# Patient Record
Sex: Male | Born: 1965
Health system: Southern US, Community
[De-identification: ages and names within clinical notes are randomized; demographics above are authoritative.]

## PROBLEM LIST (undated history)

## (undated) DIAGNOSIS — G4733 Obstructive sleep apnea (adult) (pediatric): Secondary | ICD-10-CM

## (undated) DIAGNOSIS — I519 Heart disease, unspecified: Secondary | ICD-10-CM

## (undated) DIAGNOSIS — I428 Other cardiomyopathies: Secondary | ICD-10-CM

## (undated) DIAGNOSIS — I517 Cardiomegaly: Secondary | ICD-10-CM

## (undated) DIAGNOSIS — I10 Essential (primary) hypertension: Secondary | ICD-10-CM

## (undated) DIAGNOSIS — E669 Obesity, unspecified: Secondary | ICD-10-CM

## (undated) DIAGNOSIS — E785 Hyperlipidemia, unspecified: Secondary | ICD-10-CM

## (undated) DIAGNOSIS — I4891 Unspecified atrial fibrillation: Secondary | ICD-10-CM

## (undated) DIAGNOSIS — L570 Actinic keratosis: Secondary | ICD-10-CM

## (undated) DIAGNOSIS — I739 Peripheral vascular disease, unspecified: Secondary | ICD-10-CM

## (undated) DIAGNOSIS — C4491 Basal cell carcinoma of skin, unspecified: Secondary | ICD-10-CM

## (undated) HISTORY — DX: Unspecified atrial fibrillation: I48.91

## (undated) HISTORY — DX: Heart disease, unspecified: I51.9

## (undated) HISTORY — DX: Other cardiomyopathies: I42.8

## (undated) HISTORY — DX: Hyperlipidemia, unspecified: E78.5

## (undated) HISTORY — DX: Peripheral vascular disease, unspecified: I73.9

## (undated) HISTORY — DX: Obstructive sleep apnea (adult) (pediatric): G47.33

## (undated) HISTORY — DX: Actinic keratosis: L57.0

## (undated) HISTORY — DX: Essential (primary) hypertension: I10

## (undated) HISTORY — DX: Obesity, unspecified: E66.9

## (undated) HISTORY — DX: Cardiomegaly: I51.7

## (undated) HISTORY — DX: Basal cell carcinoma of skin, unspecified: C44.91

---

## 2004-10-11 ENCOUNTER — Ambulatory Visit: Payer: Self-pay | Admitting: Cardiology

## 2004-10-15 ENCOUNTER — Inpatient Hospital Stay (HOSPITAL_BASED_OUTPATIENT_CLINIC_OR_DEPARTMENT_OTHER): Admission: RE | Admit: 2004-10-15 | Discharge: 2004-10-15 | Payer: Self-pay | Admitting: Cardiovascular Disease

## 2004-10-15 ENCOUNTER — Ambulatory Visit: Payer: Self-pay | Admitting: Cardiovascular Disease

## 2004-10-15 HISTORY — PX: CARDIAC CATHETERIZATION: SHX172

## 2004-10-16 ENCOUNTER — Ambulatory Visit: Payer: Self-pay | Admitting: Cardiology

## 2011-07-18 ENCOUNTER — Emergency Department (HOSPITAL_COMMUNITY): Payer: BC Managed Care – PPO

## 2011-07-18 ENCOUNTER — Inpatient Hospital Stay (HOSPITAL_COMMUNITY)
Admission: EM | Admit: 2011-07-18 | Discharge: 2011-07-19 | DRG: 139 | Disposition: A | Discharge status: Home / Self Care | Payer: BC Managed Care – PPO | Attending: Cardiovascular Disease | Admitting: Cardiovascular Disease

## 2011-07-18 DIAGNOSIS — I4892 Unspecified atrial flutter: Secondary | ICD-10-CM | POA: Diagnosis present

## 2011-07-18 DIAGNOSIS — G4733 Obstructive sleep apnea (adult) (pediatric): Secondary | ICD-10-CM | POA: Diagnosis present

## 2011-07-18 DIAGNOSIS — E119 Type 2 diabetes mellitus without complications: Secondary | ICD-10-CM | POA: Diagnosis present

## 2011-07-18 DIAGNOSIS — Z7982 Long term (current) use of aspirin: Secondary | ICD-10-CM

## 2011-07-18 DIAGNOSIS — I4891 Unspecified atrial fibrillation: Principal | ICD-10-CM | POA: Diagnosis present

## 2011-07-18 DIAGNOSIS — Z91199 Patient's noncompliance with other medical treatment and regimen due to unspecified reason: Secondary | ICD-10-CM

## 2011-07-18 DIAGNOSIS — Z8249 Family history of ischemic heart disease and other diseases of the circulatory system: Secondary | ICD-10-CM

## 2011-07-18 DIAGNOSIS — E669 Obesity, unspecified: Secondary | ICD-10-CM | POA: Diagnosis present

## 2011-07-18 DIAGNOSIS — Z9119 Patient's noncompliance with other medical treatment and regimen: Secondary | ICD-10-CM

## 2011-07-18 DIAGNOSIS — I10 Essential (primary) hypertension: Secondary | ICD-10-CM | POA: Diagnosis present

## 2011-07-18 LAB — CBC
MCHC: 35.5 g/dL (ref 30.0–36.0)
MCV: 85.9 fL (ref 78.0–100.0)
Platelets: 198 10*3/uL (ref 150–400)
Platelets: 199 10*3/uL (ref 150–400)
RBC: 4.76 MIL/uL (ref 4.22–5.81)
RDW: 13.2 % (ref 11.5–15.5)
RDW: 13 % (ref 11.5–15.5)
WBC: 7.3 10*3/uL (ref 4.0–10.5)
WBC: 7.9 10*3/uL (ref 4.0–10.5)

## 2011-07-18 LAB — COMPREHENSIVE METABOLIC PANEL
Albumin: 4.3 g/dL (ref 3.5–5.2)
BUN: 15 mg/dL (ref 6–23)
Chloride: 102 mEq/L (ref 96–112)
Creatinine, Ser: 0.72 mg/dL (ref 0.50–1.35)
GFR calc Af Amer: >60 mL/min (ref >60)
GFR calc non Af Amer: >60 mL/min (ref >60)
Glucose, Bld: 114 mg/dL — ABNORMAL HIGH (ref 70–99)
Total Bilirubin: 0.8 mg/dL (ref 0.3–1.2)

## 2011-07-18 LAB — PROTIME-INR
INR: 1.1 (ref 0.00–1.49)
Prothrombin Time: 14.4 seconds (ref 11.6–15.2)

## 2011-07-18 LAB — CK TOTAL AND CKMB (NOT AT ARMC): Relative Index: 2.3 (ref 0.0–2.5)

## 2011-07-18 LAB — DIFFERENTIAL
Basophils Absolute: 0.1 10*3/uL (ref 0.0–0.1)
Eosinophils Absolute: 0.1 10*3/uL (ref 0.0–0.7)
Eosinophils Relative: 2 % (ref 0–5)
Lymphs Abs: 2.8 10*3/uL (ref 0.7–4.0)

## 2011-07-18 LAB — POCT I-STAT TROPONIN I: Troponin i, poc: 0 ng/mL (ref 0.00–0.08)

## 2011-07-18 LAB — APTT: aPTT: 31 seconds (ref 24–37)

## 2011-07-18 LAB — GLUCOSE, CAPILLARY: Glucose-Capillary: 155 mg/dL — ABNORMAL HIGH (ref 70–99)

## 2011-07-19 LAB — CARDIAC PANEL(CRET KIN+CKTOT+MB+TROPI)
CK, MB: 3 ng/mL (ref 0.3–4.0)
Relative Index: 2.7 — ABNORMAL HIGH (ref 0.0–2.5)
Relative Index: INVALID (ref 0.0–2.5)
Troponin I: <0.3 ng/mL (ref <0.30)

## 2011-07-19 LAB — HEMOGLOBIN A1C: Hgb A1c MFr Bld: 8.3 % — ABNORMAL HIGH (ref <5.7)

## 2011-07-19 LAB — PROTIME-INR: INR: 1.13 (ref 0.00–1.49)

## 2011-07-19 LAB — GLUCOSE, CAPILLARY: Glucose-Capillary: 160 mg/dL — ABNORMAL HIGH (ref 70–99)

## 2011-07-19 LAB — HEPARIN LEVEL (UNFRACTIONATED)
Heparin Unfractionated: 0.1 IU/mL — ABNORMAL LOW (ref 0.30–0.70)
Heparin Unfractionated: 0.29 IU/mL — ABNORMAL LOW (ref 0.30–0.70)

## 2011-07-20 NOTE — H&P (Signed)
NAME:  Charles [name], Charles [name] NO.:  [id number]  MEDICAL RECORD NO.:  [id number]  LOCATION:  2922                         FACILITY:  [facility]  PHYSICIAN:  Thurmon Fair, MD     DATE OF BIRTH:  06/20/1966  DATE OF ADMISSION:  07/18/2011 DATE OF DISCHARGE:                             HISTORY & PHYSICAL   Charles [name] is a 45 year old gentleman who presents with atrial fibrillation and rapid ventricular response.  Charles [name] has had irregular palpitations for years.  These are often associated with night sweats.  He does have a history of documented paroxysmal atrial fibrillation that was associated with a coronary angiogram performed many years ago.  No other documented episodes of atrial fibrillation or atrial flutter have occurred to his knowledge since that time.  In 2005, he underwent coronary arteriography due to a what ended up being a false positive nuclear stress test suggesting inferior wall ischemia.Over the last week, he has had worsening dyspnea on exertion that over the last couple of days has approached New York Heart Association functional class 3 level.  He has also experienced chest pressure, again worsened with exertion.  He has felt that he has a lot less energy than he has in the past.  Currently, he has received intravenous diltiazem for rate control and his rhythm has promptly organized to atrial flutter with variable AV block and an average ventricular rate of about 80 beats per minute.  He is currently asymptomatic.  He does not have any history of stroke or transient ischemic attack or other embolic events.  PAST MEDICAL HISTORY:  Significant for poorly-controlled diabetes mellitus (most recent hemoglobin A1c 8% which is an improvement from the previous level of 11%); insufficiently treated systemic hypertension, recently was added beta-blocker therapy; formally diagnosed obstructive sleep apnea by sleep study, but noncompliant with CPAP therapy  for many years.  He is also obese.  He does not have a history of hypercholesterolemia and in fact tells me that his lipid level were excellent when they were checked by his primary care physician.  He has no history of peripheral vascular disease and at the time of his cardiac catheterization, a nuclear stress test many years ago reportedly had normal left ventricular systolic function with an estimated ejection fraction of about 60%.  He does not have history of valvular problems and as mentioned, no history of stroke or TIA.  FAMILY HISTORY:  Significant for premature coronary artery disease in his older sister who has had multiple stent procedures.  There is no family history of arrhythmias or sudden cardiac death.  He has no known drug allergies but did develop nausea when Novocaine was administered for dental procedure.  CURRENT MEDICATIONS: 1. Glipizide 5 mg twice daily. 2. Glucophage 500 mg once daily. 3. Carvedilol 3.125 mg b.i.d. which was recently started. 4. Lisinopril 20 mg once daily.  SOCIAL HISTORY:  He has never smoked.  He drinks alcohol no more than once or twice a year.  He has never used recreational drugs.  He is in a stable monogamous homosexual relationship for the last 10 months.  PHYSICAL EXAMINATION:  GENERAL:  He is a tall, but also  obese individual. VITAL SIGNS:  He is 6 feet 2 inches tall and weighs roughly 280 pounds. His blood pressure is 170/93, his heart rate is 80 beats per minute and irregular, respiratory rate is 12, he is afebrile, and oxygen saturation is 97% on room air. HEENT:  Head exam is unremarkable except for very crowded oropharynx. His mouth, ears, nose, and teeth are otherwise within normal limits.  He does not have any evidence of ear creases, xanthelasma, exophthalmos, lid lag, or myxedema. NECK:  Short and broad, but very supple.  He does not have any evidence of jugular venous distention or hepatojugular reflux, but I am  not sure that we can clearly identify his jugular veins.  He does not have carotid delay or bruits.  There is no goiter or lymphadenopathy. LUNGS:  Clear to auscultation bilaterally with symmetrical respiratory excursions and no evidence of abnormal fremitus, dullness to percussion, egophony, or signs of consolidation. CARDIOVASCULAR:  An apical impulse that is very difficult to locate. The rhythm is irregular.  Normal first and second heart sounds without any murmurs, rubs, or gallops. ABDOMEN:  Obese and examination is limited, but there is no obvious tenderness, distention, organomegaly, mass, or bruit.  The bowel sounds are normal. EXTREMITIES:  No clubbing or cyanosis or edema.  Distal pulses are 3+ and bounding in the dorsalis pedis, posterior tibials, radials, and ulnars bilaterally.  I cannot locate his popliteals and it is hard to palpate his femoral arteries.  I do not hear any femoral or subclavian bruits. NEUROLOGIC:  Does not show any gross focal deficits.  The deep tendon reflexes appear to be normal kinetic.  There are no overt clinical findings to suggest thyroid illness.  His electrocardiogram shows atrial fibrillation with rapid ventricular response, but currently on the monitor, the rhythm is clearly atrial flutter.  His chest x-ray is a portable film and describes cardiomegaly, but there is no evidence of congestive heart failure or other active abnormalities.  I think the heart may appear large simply because of the AP film.  Laboratory tests are significant for normal CK, CK-MB, and troponin levels.  Normal CBC with a hemoglobin of 14.1, white blood cell count of 7.3, and a platelet count of 198,000.  The comprehensive metabolic panel is entirely normal except for a glucose level of 114 that is borderline elevated.  The creatinine is 0.72.  All liver function tests are normal as are the electrolytes.  TSH level is not yet available.  ASSESSMENT AND PLAN:   Charles [name] is a 45 year old gentleman who presents with atrial fibrillation with rapid ventricular response, subsequently organized to atrial flutter with controlled ventricular rate who also has hypertension, diabetes mellitus, and untreated obstructive sleep apnea.  His CHADS2 score is 2 which would lead to an indication for chronic warfarin anticoagulation.  He will be started on intravenous heparin and subsequently warfarin.  Since the arrhythmia is of uncertain duration and likely has lasted for more than 24 hours, an immediate cardioversion is not recommended.  If we need to perform cardioversion, that should be preceded by a transesophageal echocardiogram.  Because of this, we will hold his breakfast tomorrow morning just in case we have to organize a TEE cardioversion.  On the other hand, the fact that his rhythm is becoming more organized suggest that he may convert to sinus rhythm over the next several hours.  In the meantime, we will try to convert his rate control medicines to oral medications.  I think the  carvedilol that has recently been started will be an optimal choice to provide both better blood pressure control as well as rate control.  Together with lisinopril, this would be a very sensible system of antihypertensives and rate control medication for a patient with diabetes mellitus.  In the long term, treatment of obstructive sleep apnea with CPAP is critically important.  I suspect that obstructive sleep apnea, possibly complicated by pulmonary hypertension, cor pulmonale may well under lie his arrhythmia.  In the long term, also aggressive attempts at weight loss would be beneficial for treatment of obstructive sleep apnea and diabetes mellitus and indirectly may benefit his arrhythmia.  At this point in time, antiarrhythmic medications are not indicated especially since he is not yet anticoagulated.  I also do not think that he would be necessarily a good  candidate for long-term antiarrhythmic therapy since when he has good rate control he appears to be asymptomatic.  The long-term risk of embolic stroke was discussed in detail with Charles [name].  We also briefly discussed warfarin.  We will take the opportunity tomorrow to talk about alternatives to warfarin such as Pradaxa and Xarelto and if he chooses to go with warfarin right on long-term, we will have to discuss dietary and drug interactions and enroll him in the Coumadin clinic.  He had a fairly extensive workup for structural heart disease in 2005. I believe it is unlikely that he will have developed significant coronary artery disease since that time.  An outpatient nuclear stress test will probably suffice to exclude any progression of a coronary problem.  I do not think coronary angiography is indicated.  His chest pressure could easily be caused by rapid ventricular rate in the setting of diastolic dysfunction and high filling pressures related to hypertensive cardiomyopathy.  Thyroid disease needs to be excluded with appropriate testing despite the absence of clear-cut clinical signs of this other than the arrhythmia itself.     Thurmon Fair, MD     MC/MEDQ  D:  07/18/2011  T:  07/19/2011  Job:  [id number]  cc:   [facility] Dr. Garner Nash  Electronically Signed by Thurmon Fair M.D. on 07/20/2011 11:28:20 AM

## 2011-07-22 NOTE — Discharge Summary (Signed)
  NAME:  [name], [name] NO.:  [id number]  MEDICAL RECORD NO.:  [id number]  LOCATION:  2922                         FACILITY:  [facility]  PHYSICIAN:  Charles Fair, MD     DATE OF BIRTH:  [birth year]  DATE OF ADMISSION:  07/18/2011 DATE OF DISCHARGE:  07/19/2011                              DISCHARGE SUMMARY   DISCHARGE DIAGNOSES: 1. Paroxysmal atrial fibrillation, converted spontaneously to sinus     rhythm. 2. Sleep apnea, noncompliant with CPAP. 3. Treated hypertension. 4. Normal coronaries at catheterization 6 years ago. 5. Type 2 non-insulin dependent diabetes.  HOSPITAL COURSE:  [name] is a 45 year old male who presented to the emergency room with atrial fibrillation.  We were asked to see him in consult by the emergency room physician.  He had had a previous catheterization 6 years prior that showed essentially normal coronaries. He does have a history of sleep apnea and was noncompliant with his CPAP as it has been uncomfortable.  Dr. Royann Shivers admitted him through the emergency room and put him on IV diltiazem.  We also added heparin.  We increased his beta-blocker dose.  He converted spontaneously on the morning of July 19, 2011.  Echocardiogram has been done and the patient can be discharged later today barring any unusual findings on echocardiogram.  Dr. Royann Shivers would like to have an outpatient Myoview as well as CPAP titration study.  Initially, we wanted to try Pradaxa but this may be prohibitive because of cost and so we will change him to warfarin.  He will see Dr. Royann Shivers in followup.  LABORATORY DATA:  CK-MB and troponins were negative.  TSH is 4.78, free T4 is 1.16.  Liver functions are normal.  Sodium 136, potassium 3.7, BUN 15, creatinine 0.7.  White count 7.3, hemoglobin 14.1, hematocrit 39.7, platelets 198.  Chest x-ray shows no evidence of acute process.  EKG shows sinus rhythm without acute changes.  DISPOSITION:  The patient is  discharged in stable condition.  He will be contacted for outpatient followup as noted above.     Charles [name], P.A.   ______________________________ Charles Fair, MD    LKK/MEDQ  D:  07/19/2011  T:  07/19/2011  Job:  [id number]  Electronically Signed by Corine Shelter P.A. on 07/22/2011 09:02:42 AM Electronically Signed by Charles [name] M.D. on 07/22/2011 01:40:35 PM

## 2011-07-22 NOTE — Discharge Summary (Signed)
  NAME:  MOUSSA, WIEGAND NO.:  [id number]  MEDICAL RECORD NO.:  [id number]  LOCATION:  2922                         FACILITY:  [facility]  PHYSICIAN:  Thurmon Fair, MD     DATE OF BIRTH:  [birth year]  DATE OF ADMISSION:  07/18/2011 DATE OF DISCHARGE:  07/19/2011                              DISCHARGE SUMMARY   ADDENDUM: Mr. Kinsella has decided to try Pradaxa 150 mg b.i.d.  We also got his echo report back prior to discharge and this showed an EF of 45-50% and grade 2 diastolic dysfunction.  He will have followup as noted in the discharge summary.     Abelino Derrick, P.A.   ______________________________ Thurmon Fair, MD    LKK/MEDQ  D:  07/19/2011  T:  07/20/2011  Job:  [id number]  Electronically Signed by Corine Shelter P.A. on 07/22/2011 09:02:56 AM Electronically Signed by Thurmon Fair M.D. on 07/22/2011 01:40:40 PM

## 2011-07-31 ENCOUNTER — Encounter: Payer: Self-pay | Admitting: Internal Medicine

## 2011-08-06 ENCOUNTER — Inpatient Hospital Stay (HOSPITAL_COMMUNITY)
Admission: AD | Admit: 2011-08-06 | Discharge: 2011-08-09 | DRG: 544 | Disposition: A | Discharge status: Home / Self Care | Payer: BC Managed Care – PPO | Source: Ambulatory Visit | Attending: Cardiovascular Disease | Admitting: Cardiovascular Disease

## 2011-08-06 ENCOUNTER — Inpatient Hospital Stay (HOSPITAL_COMMUNITY): Payer: BC Managed Care – PPO

## 2011-08-06 DIAGNOSIS — I4891 Unspecified atrial fibrillation: Principal | ICD-10-CM | POA: Diagnosis present

## 2011-08-06 DIAGNOSIS — G4733 Obstructive sleep apnea (adult) (pediatric): Secondary | ICD-10-CM | POA: Diagnosis present

## 2011-08-06 DIAGNOSIS — Z9119 Patient's noncompliance with other medical treatment and regimen: Secondary | ICD-10-CM

## 2011-08-06 DIAGNOSIS — I428 Other cardiomyopathies: Secondary | ICD-10-CM | POA: Diagnosis present

## 2011-08-06 DIAGNOSIS — I509 Heart failure, unspecified: Secondary | ICD-10-CM | POA: Diagnosis present

## 2011-08-06 DIAGNOSIS — I5023 Acute on chronic systolic (congestive) heart failure: Secondary | ICD-10-CM | POA: Diagnosis present

## 2011-08-06 DIAGNOSIS — Z6837 Body mass index (BMI) 37.0-37.9, adult: Secondary | ICD-10-CM

## 2011-08-06 DIAGNOSIS — Z91199 Patient's noncompliance with other medical treatment and regimen due to unspecified reason: Secondary | ICD-10-CM

## 2011-08-06 LAB — DIFFERENTIAL
Basophils Relative: 1 % (ref 0–1)
Eosinophils Absolute: 0.1 10*3/uL (ref 0.0–0.7)
Lymphs Abs: 2.7 10*3/uL (ref 0.7–4.0)
Neutro Abs: 2.9 10*3/uL (ref 1.7–7.7)
Neutrophils Relative %: 46 % (ref 43–77)

## 2011-08-06 LAB — APTT: aPTT: 47 seconds — ABNORMAL HIGH (ref 24–37)

## 2011-08-06 LAB — CBC
Hemoglobin: 13.6 g/dL (ref 13.0–17.0)
MCV: 85.9 fL (ref 78.0–100.0)
Platelets: 192 10*3/uL (ref 150–400)
RBC: 4.46 MIL/uL (ref 4.22–5.81)
WBC: 6.3 10*3/uL (ref 4.0–10.5)

## 2011-08-06 LAB — COMPREHENSIVE METABOLIC PANEL
BUN: 17 mg/dL (ref 6–23)
CO2: 28 mEq/L (ref 19–32)
Chloride: 105 mEq/L (ref 96–112)
Creatinine, Ser: 0.69 mg/dL (ref 0.50–1.35)
GFR calc Af Amer: >60 mL/min (ref >60)
GFR calc non Af Amer: >60 mL/min (ref >60)
Glucose, Bld: 117 mg/dL — ABNORMAL HIGH (ref 70–99)
Total Bilirubin: 0.6 mg/dL (ref 0.3–1.2)

## 2011-08-06 LAB — CARDIAC PANEL(CRET KIN+CKTOT+MB+TROPI)
CK, MB: 2.5 ng/mL (ref 0.3–4.0)
Total CK: 75 U/L (ref 7–232)

## 2011-08-06 LAB — GLUCOSE, CAPILLARY: Glucose-Capillary: 115 mg/dL — ABNORMAL HIGH (ref 70–99)

## 2011-08-06 LAB — PROTIME-INR: Prothrombin Time: 16.3 seconds — ABNORMAL HIGH (ref 11.6–15.2)

## 2011-08-06 LAB — MAGNESIUM: Magnesium: 2 mg/dL (ref 1.5–2.5)

## 2011-08-07 ENCOUNTER — Inpatient Hospital Stay (HOSPITAL_COMMUNITY): Payer: BC Managed Care – PPO

## 2011-08-07 LAB — CARDIAC PANEL(CRET KIN+CKTOT+MB+TROPI)
Relative Index: INVALID (ref 0.0–2.5)
Relative Index: INVALID (ref 0.0–2.5)
Troponin I: <0.3 ng/mL (ref <0.30)

## 2011-08-07 LAB — GLUCOSE, CAPILLARY
Glucose-Capillary: 178 mg/dL — ABNORMAL HIGH (ref 70–99)
Glucose-Capillary: 149 mg/dL — ABNORMAL HIGH (ref 70–99)
Glucose-Capillary: 149 mg/dL — ABNORMAL HIGH (ref 70–99)

## 2011-08-07 LAB — BASIC METABOLIC PANEL
Calcium: 9.9 mg/dL (ref 8.4–10.5)
GFR calc non Af Amer: >60 mL/min (ref >60)
Glucose, Bld: 158 mg/dL — ABNORMAL HIGH (ref 70–99)
Sodium: 141 mEq/L (ref 135–145)

## 2011-08-08 LAB — GLUCOSE, CAPILLARY
Glucose-Capillary: 124 mg/dL — ABNORMAL HIGH (ref 70–99)
Glucose-Capillary: 156 mg/dL — ABNORMAL HIGH (ref 70–99)
Glucose-Capillary: 129 mg/dL — ABNORMAL HIGH (ref 70–99)

## 2011-08-09 HISTORY — PX: CARDIOVERSION: SHX1299

## 2011-08-09 LAB — BASIC METABOLIC PANEL
CO2: 32 mEq/L (ref 19–32)
Calcium: 9.6 mg/dL (ref 8.4–10.5)
Creatinine, Ser: 1 mg/dL (ref 0.50–1.35)
GFR calc Af Amer: >60 mL/min (ref >60)
GFR calc non Af Amer: >60 mL/min (ref >60)
Sodium: 138 mEq/L (ref 135–145)

## 2011-08-09 LAB — DIGOXIN LEVEL: Digoxin Level: 0.7 ng/mL — ABNORMAL LOW (ref 0.8–2.0)

## 2011-08-09 LAB — PRO B NATRIURETIC PEPTIDE: Pro B Natriuretic peptide (BNP): 162.3 pg/mL — ABNORMAL HIGH (ref 0–125)

## 2011-08-09 LAB — MAGNESIUM: Magnesium: 2.2 mg/dL (ref 1.5–2.5)

## 2011-08-09 NOTE — Cardiovascular Report (Signed)
  NAME:  Charles [name], Charles [name] NO.:  [id number]  MEDICAL RECORD NO.:  [id number]  LOCATION:  3734                         FACILITY:  [facility]  PHYSICIAN:  Landry Corporal, MD DATE OF BIRTH:  03/24/1966  DATE OF PROCEDURE: DATE OF DISCHARGE:                           SYNCHRONIZED DIRECT CURRENT CARDIOVERSION   PRIMARY CARDIOLOGIST:  Thurmon Fair, MD of [facility]  PERFORMING PHYSICIAN:  Landry Corporal, MD from [facility].  ANESTHESIOLOGIST:  Kaylyn Layer. Michelle Piper, MD  PROCEDURE PERFORMED:  Synchronized direct current cardioversion.  INDICATION:  Recurrent atrial fibrillation with rapid ventricular rate.  BRIEF HISTORY:  [name] is a 45 year old gentleman who was re-admitted on August 06, 2011, with recurrence of atrial fibrillation with rapid ventricular rate.  He previous was admitted and he spontaneously and now has gone back into atrial fibrillation.  He has been therapeutic on Pradaxa since early August and was then started on sotalol to see if it could spontaneously convert; however, he did not.  He has been rate controlled however, but continues to be in atrial fibrillation and therefore is referred for cardioversion.  The risks, benefits, alternatives, and indications were explained to the patient in detail and informed consent was obtained with a sign and placed on the chart.  With the patient supine in bed and airway being monitored by anesthesia technologist, Dr. Michelle Piper administered 100 mg of intravenous propofol. After adequate sedation was ensured and the airway was maintained and the pads have been previously placed in the anterior-posterior position, the synchronization was confirmed and a 200-joules was selected.  The patient was successfully cardioverted with 1 synchronized shock at 200 joules.  This was confirmed by telemetry strip and by EKG to be in normal sinus rhythm, now with a heart  rate of 50 beats per minute and blocks.  The patient was stable, he is in recovery room well from sedation.  IMPRESSION:  Successful synchronized direct current cardioversion of atrial fibrillation in normal sinus rhythm with 1 shock.  PLAN:  We will continue with Pradaxa and sotalol.  The patient will likely be discharged later on this afternoon if he continues to be in this rhythm.  He will then follow up with Dr. Erin Hearing originally schedule.          ______________________________ Landry Corporal, MD     DWH/MEDQ  D:  08/09/2011  T:  08/09/2011  Job:  [id number]  cc:   Thurmon Fair, MD Donzetta Sprung, MD  Electronically Signed by Bryan Lemma MD on 08/09/2011 11:46:21 PM

## 2011-08-13 ENCOUNTER — Encounter: Payer: Self-pay | Admitting: Internal Medicine

## 2011-08-17 NOTE — Discharge Summary (Signed)
NAME:  Charles [name], Charles [name] NO.:  [id number]  MEDICAL RECORD NO.:  [id number]  LOCATION:  3734                         FACILITY:  [facility]  PHYSICIAN:  Thurmon Fair, MD     DATE OF BIRTH:  1966/11/07  DATE OF ADMISSION:  08/06/2011 DATE OF DISCHARGE:  08/09/2011                              DISCHARGE SUMMARY   DISCHARGE DIAGNOSES: 1. Atrial fibrillation with controlled ventricular rate status post     DCCV cardioversion on August 09, 2011, to normal sinus rhythm. 2. Nonischemic cardiomyopathy 39%. 3. Acute systolic heart failure on Lasix. 4. Anticoagulated with Pradaxa. 5. Obstructive sleep apnea for which he uses a continuous positive     airway pressure machine.  HOSPITAL COURSE:  Charles [name] is a 45 year old male with history of diabetes.  His most recent A1c of 8% which is improved from his previous level of 11%, hypertension, obstructive sleep apnea, was noncompliant with CPAP machine for many years, also atrial fibrillation.  Initially, underwent nuclear perfusion imaging which showed an ejection fraction of 39% with normal perfusion.  A 2-D echocardiogram showed dilated left heart chambers, global hypokinesis.  He was recently admitted prior to this admission with AFib RVR which spontaneously converted to normal sinus rhythm and presents back with AFib with RVR for which digoxin was added.  He was admitted for diuresis and initiation of antiarrhythmic treatment with sotalol.  He has been on Pradaxa for now 3 weeks.  He is scheduled for DC cardioversion on August 09, 2011.  Nutritional education was provided as well, to provide low-sodium education, and heart failure nutritional tips.  Sotalol was increased to 120 mg b.i.d. Diuresis continued with Lasix 40 mg daily with good output decrease in 3 kg since admission.  QTc was regularly checked and then was approximately 480 on August 07, 2011.  Initially started at 423 milliseconds.  As of August 09, 2011, the  patient had no complaints.  DC cardioversion was completed at 1115 hours, and back to sinus rhythm. EKG checked at 1411 hours showed some continued normal sinus rhythm at a rate of 61 beats per minute.  Digoxin will be discontinued.  The patient would continue on sotalol.  He has been seen by Dr. Herbie Baltimore, feels stable for discharge home.  He will also follow up with Dr. Royann Shivers  in approximately 2 weeks.  DISCHARGE LABS:  WBC 6.3, hemoglobin 13.6, hematocrit 38.3, platelets 192.  Sodium 138, potassium 3.6, chloride 98, carbon dioxide 32, glucose 143, BUN 22, creatinine 1.00, calcium 9.6, magnesium 2.2.  BNP 162.3, which is decreased from the initial 962.2, ferritin was 96, and digoxin level was 0.7.  STUDIES/PROCEDURES: 1. Chest x-ray on August 07, 2011, showed no active cardiopulmonary     disease.  Vascularly, he was normal.  Lungs were clear without     infiltrates or effusions.  Heart size upper limits of normal. 2. DC cardioversion, completed August 09, 2011, to control of     ventricular rate atrial fibrillation to normal sinus rhythm.  This     was  completed without complications.  DISCHARGE MEDICATIONS: 1. Aspirin 81 mg one tablet by mouth daily. 2. Furosemide 40 mg one tablet  by mouth daily. 3. Lisinopril 40 mg one tablet by mouth daily. 4. Sotalol 120 mg one tablet by mouth twice daily. 5. Spirolactone 25 mg one tablet by mouth daily. 6. Cetirizine 10 mg one tablet by mouth daily. 7. Carvedilol 25 mg one tablet by mouth twice daily. 8. Fish oil 1200 mg three capsules by mouth daily. 9. Glipizide 5 mg one tablet by mouth twice daily with meals. 10.Glucosamine HCl/chondroitin 1500/1200 mg two tablets by mouth     daily. 11.Metformin XR 500 mg one tab by mouth daily. 12.Multivitamin therapeutic one tablet by mouth daily. 13.Pradaxa 150 mg one-half tablet by mouth twice daily.  DISPOSITION:  Charles [name] will be discharged home in stable condition.  It is recommended he  increase activity slowly.  He is recommended to eat low-sodium, heart-healthy, low-carbohydrate diet.  He will follow up with Dr. Royann Shivers on Wednesday on August 21, 2011, at 9:30 a.m.    ______________________________ Wilburt Finlay, PA   ______________________________ Thurmon Fair, MD    BH/MEDQ  D:  08/09/2011  T:  08/09/2011  Job:  [id number]  Electronically Signed by Wilburt Finlay PA on 08/13/2011 10:25:18 AM Electronically Signed by Thurmon Fair M.D. on 08/17/2011 09:50:40 AM

## 2011-09-06 ENCOUNTER — Encounter: Payer: Self-pay | Admitting: Internal Medicine

## 2011-09-11 ENCOUNTER — Encounter: Payer: Self-pay | Admitting: Internal Medicine

## 2011-09-11 ENCOUNTER — Ambulatory Visit (INDEPENDENT_AMBULATORY_CARE_PROVIDER_SITE_OTHER): Payer: BC Managed Care – PPO | Admitting: Internal Medicine

## 2011-09-11 ENCOUNTER — Encounter: Payer: Self-pay | Admitting: *Deleted

## 2011-09-11 DIAGNOSIS — I480 Paroxysmal atrial fibrillation: Secondary | ICD-10-CM | POA: Insufficient documentation

## 2011-09-11 DIAGNOSIS — G4730 Sleep apnea, unspecified: Secondary | ICD-10-CM

## 2011-09-11 DIAGNOSIS — I519 Heart disease, unspecified: Secondary | ICD-10-CM

## 2011-09-11 DIAGNOSIS — I4891 Unspecified atrial fibrillation: Secondary | ICD-10-CM

## 2011-09-11 NOTE — Patient Instructions (Signed)

## 2011-09-11 NOTE — Assessment & Plan Note (Addendum)
Charles [name] has symptomatic paroxysmal atrial fibrillation.  He has failed medical therapy with coreg, diltiazem, and sotalol. Therapeutic strategies for afib including medicine and ablation were discussed in detail with the patient today. Risk, benefits, and alternatives to EP study and radiofrequency ablation for afib were also discussed in detail today. These risks include but are not limited to stroke, bleeding, vascular damage, tamponade, perforation, damage to the esophagus, lungs, and other structures, pulmonary vein stenosis, worsening renal function, and death. The patient understands these risk and wishes to proceed.  We will therefore proceed with catheter ablation at the next available time.  He will continue pradaxa in the interim. Stop ASA

## 2011-09-11 NOTE — Assessment & Plan Note (Signed)
He is not compliant with CPAP.  He needs to obtain and become compliant with CPAP prior to his ablation

## 2011-09-11 NOTE — Assessment & Plan Note (Signed)
euvolemic today On an optimal medical regimen No changes

## 2011-09-11 NOTE — Progress Notes (Signed)
Charles [name] is a pleasant 45 y.o. WM patient with a h/o paroxysmal atrial fibrillation and nonischemic CM who presents today for EP consultation regarding atrial fibrillation.  He reports initially having "fluttering" sensation in 2005.  This improved until several months ago.  He reports recent increase in frequency and duration of palpitations with associated SOB.  He presented to [facility] with these symptoms 07/19/11 and was found to have afib with RVR.  He was placed on pradaxa and also diltiziazem.  He converted to sinus rhythm ad was discharged.  He developed recurrent atrial fibrillation.  He was admitted and initiated on Sotalol.  He underwent [facility] 08/09/11.  He returned to Dr Croituro's office 08/14/11 and was found to have returned to afib.  He reports doing reasonably well since that time.  He reports having afib every 1-2 weeks with associated weakness, dizziness, and SOB.  These episodes typically last 10-15 minutes.    Today, he denies symptoms of chest pain,  orthopnea, PND, lower extremity edema, presyncope, syncope, or neurologic sequela. The patient is tolerating medications without difficulties and is otherwise without complaint today.   Past Medical History  Diagnosis Date  . Atrial fibrillation   . Nonischemic cardiomyopathy     39% - echo on 07/20/2011 -- EF of 45-50% and grade  2 diastolic dysfunction.  . Chronic systolic dysfunction of left ventricle   . Obstructive sleep apnea     noncompliant with CPAP  . Hypertension   . Diabetes mellitus     A1c of 8% is an improvement from the previous level of 11%  . Hyperlipidemia   . Obesity   . Peripheral vascular disease    History reviewed. No pertinent past surgical history.  Current Outpatient Prescriptions  Medication Sig Dispense Refill  . aspirin 81 MG tablet Take 81 mg by mouth daily.        . carvedilol (COREG) 25 MG tablet Take 25 mg by mouth 2 (two) times daily with a meal.       . cetirizine (ZYRTEC) 10 MG tablet  Take 10 mg by mouth daily.        . dabigatran (PRADAXA) 150 MG CAPS Take 75 mg by mouth every 12 (twelve) hours.        . fluticasone (FLONASE) 50 MCG/ACT nasal spray Place 1 spray into the nose daily.       . furosemide (LASIX) 40 MG tablet Take 40 mg by mouth daily.       Marland Kitchen glipiZIDE (GLUCOTROL) 5 MG tablet Take 5 mg by mouth 2 (two) times daily before a meal.       . Glucosamine-Chondroitin 1500-1200 MG/30ML LIQD Take 1 capsule by mouth daily.        Marland Kitchen lisinopril (PRINIVIL,ZESTRIL) 40 MG tablet Take 40 mg by mouth daily.       . metFORMIN (GLUCOPHAGE-XR) 500 MG 24 hr tablet Take 500 mg by mouth daily with breakfast.        . multivitamin (THERAGRAN) per tablet Take 1 tablet by mouth daily.        . Omega-3 Fatty Acids (FISH OIL) 1200 MG CAPS Take 1 capsule by mouth 3 (three) times daily.        . sotalol (BETAPACE) 120 MG tablet Take 120 mg by mouth 2 (two) times daily.       Marland Kitchen spironolactone (ALDACTONE) 25 MG tablet Take 25 mg by mouth daily.         Allergies  Allergen Reactions  .  Novocain Nausea And Vomiting    History   Social History  . Marital Status: Divorced    Spouse Name: N/A    Number of Children: N/A  . Years of Education: N/A   Occupational History  . Not on file.   Social History Main Topics  . Smoking status: Never Smoker   . Smokeless tobacco: Never Used  . Alcohol Use: No  . Drug Use: No  . Sexually Active: Not on file   Other Topics Concern  . 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 Coronary artery disease Sister     with multiple stents  . Coronary artery disease      in multiple family members  . Hypertension Mother   . Hypertension Brother   . Diabetes Mother   . Diabetes Sister   . Diabetes Brother   . Alcohol abuse Father     ROS- All systems are reviewed and negative except as per the HPI above  Physical Exam: Filed  Vitals:   09/11/11 1028  BP: 122/81  Pulse: 76  Height: 6\' 3"  (1.905 m)  Weight: 278 lb (126.1 kg)    GEN- The patient is well appearing, alert and oriented x 3 today.   Head- normocephalic, atraumatic Eyes-  Sclera clear, conjunctiva pink Ears- hearing intact Oropharynx- clear Neck- supple, no JVP Lymph- no cervical lymphadenopathy Lungs- Clear to ausculation bilaterally, normal work of breathing Heart- Regular rate and rhythm, no murmurs, rubs or gallops, PMI not laterally displaced GI- soft, NT, ND, + BS Extremities- no clubbing, cyanosis, or edema MS- no significant deformity or atrophy Skin- no rash or lesion Psych- euthymic mood, full affect Neuro- strength and sensation are intact  EKG today reveals sinus rhythm 89 bpm, Qtc 442  Assessment and Plan:

## 2011-09-23 ENCOUNTER — Telehealth: Payer: Self-pay | Admitting: Internal Medicine

## 2011-09-23 DIAGNOSIS — I4891 Unspecified atrial fibrillation: Secondary | ICD-10-CM

## 2011-09-23 NOTE — Telephone Encounter (Signed)
Pt was calling about his short term disability paperwork

## 2011-09-23 NOTE — Telephone Encounter (Signed)
Sent message to [facility] went back to them Thurs 09/19/11

## 2011-09-24 ENCOUNTER — Other Ambulatory Visit (INDEPENDENT_AMBULATORY_CARE_PROVIDER_SITE_OTHER): Payer: BC Managed Care – PPO | Admitting: *Deleted

## 2011-09-24 DIAGNOSIS — I4891 Unspecified atrial fibrillation: Secondary | ICD-10-CM

## 2011-09-24 LAB — CBC WITH DIFFERENTIAL/PLATELET
Basophils Relative: 0.4 % (ref 0.0–3.0)
Eosinophils Absolute: 0.1 10*3/uL (ref 0.0–0.7)
Eosinophils Relative: 1 % (ref 0.0–5.0)
HCT: 39 % (ref 39.0–52.0)
Hemoglobin: 13.2 g/dL (ref 13.0–17.0)
Lymphs Abs: 2.5 10*3/uL (ref 0.7–4.0)
MCHC: 33.8 g/dL (ref 30.0–36.0)
MCV: 89.4 fl (ref 78.0–100.0)
Monocytes Absolute: 0.5 10*3/uL (ref 0.1–1.0)
Neutro Abs: 4.9 10*3/uL (ref 1.4–7.7)
Neutrophils Relative %: 61.4 % (ref 43.0–77.0)
RBC: 4.37 Mil/uL (ref 4.22–5.81)
WBC: 7.9 10*3/uL (ref 4.5–10.5)

## 2011-09-24 LAB — BASIC METABOLIC PANEL
BUN: 21 mg/dL (ref 6–23)
Creatinine, Ser: 1 mg/dL (ref 0.4–1.5)
GFR: 84.9 mL/min (ref >60.00)
Glucose, Bld: 258 mg/dL — ABNORMAL HIGH (ref 70–99)
Potassium: 4.1 mEq/L (ref 3.5–5.1)

## 2011-09-30 ENCOUNTER — Ambulatory Visit (HOSPITAL_COMMUNITY)
Admission: RE | Admit: 2011-09-30 | Discharge: 2011-09-30 | Disposition: A | Discharge status: Home / Self Care | Payer: BC Managed Care – PPO | Source: Ambulatory Visit | Attending: Internal Medicine | Admitting: Internal Medicine

## 2011-09-30 DIAGNOSIS — I4891 Unspecified atrial fibrillation: Secondary | ICD-10-CM | POA: Insufficient documentation

## 2011-10-01 ENCOUNTER — Ambulatory Visit (HOSPITAL_COMMUNITY)
Admission: RE | Admit: 2011-10-01 | Discharge: 2011-10-02 | Disposition: A | Discharge status: Home / Self Care | Payer: BC Managed Care – PPO | Source: Ambulatory Visit | Attending: Internal Medicine | Admitting: Internal Medicine

## 2011-10-01 DIAGNOSIS — Z23 Encounter for immunization: Secondary | ICD-10-CM | POA: Insufficient documentation

## 2011-10-01 DIAGNOSIS — I739 Peripheral vascular disease, unspecified: Secondary | ICD-10-CM | POA: Insufficient documentation

## 2011-10-01 DIAGNOSIS — E785 Hyperlipidemia, unspecified: Secondary | ICD-10-CM | POA: Insufficient documentation

## 2011-10-01 DIAGNOSIS — E669 Obesity, unspecified: Secondary | ICD-10-CM | POA: Insufficient documentation

## 2011-10-01 DIAGNOSIS — I4891 Unspecified atrial fibrillation: Secondary | ICD-10-CM

## 2011-10-01 DIAGNOSIS — I10 Essential (primary) hypertension: Secondary | ICD-10-CM | POA: Insufficient documentation

## 2011-10-01 DIAGNOSIS — E119 Type 2 diabetes mellitus without complications: Secondary | ICD-10-CM | POA: Insufficient documentation

## 2011-10-01 DIAGNOSIS — G4733 Obstructive sleep apnea (adult) (pediatric): Secondary | ICD-10-CM | POA: Insufficient documentation

## 2011-10-01 HISTORY — PX: RADIOFREQUENCY ABLATION: SHX2290

## 2011-10-01 LAB — POCT ACTIVATED CLOTTING TIME
Activated Clotting Time: 248 seconds
Activated Clotting Time: 270 seconds
Activated Clotting Time: 182 seconds
Activated Clotting Time: 193 seconds
Activated Clotting Time: 171 seconds

## 2011-10-01 LAB — GLUCOSE, CAPILLARY: Glucose-Capillary: 175 mg/dL — ABNORMAL HIGH (ref 70–99)

## 2011-10-02 LAB — BASIC METABOLIC PANEL
BUN: 14 mg/dL (ref 6–23)
CO2: 27 mEq/L (ref 19–32)
Calcium: 9.1 mg/dL (ref 8.4–10.5)
Creatinine, Ser: 0.88 mg/dL (ref 0.50–1.35)
Glucose, Bld: 132 mg/dL — ABNORMAL HIGH (ref 70–99)
Sodium: 137 mEq/L (ref 135–145)

## 2011-10-04 NOTE — Op Note (Signed)
  NAME:  Charles [name], Charles [name] NO.:  [id number]  MEDICAL RECORD NO.:  [id number]  LOCATION:  [facility]                         FACILITY:  [facility]  PHYSICIAN:  Italy Hilty, MD         DATE OF BIRTH:  February 26, 1966  DATE OF PROCEDURE: DATE OF DISCHARGE:                              OPERATIVE REPORT   TRANSESOPHAGEAL ECHOCARDIOGRAM  SURGEON:  Italy Hilty, MD  INDICATION:  Atrial fibrillation prior to ablation.  PROCEDURE:  After informed consent was obtained, the patient was brought to the endoscopy suite and after procedural and safety time-out, the patient was given approximately 30 mL of viscous lidocaine for topical anesthesia.  Subsequently, he was given 30 mL of 2% viscous lidocaine as well as 4 mg of Versed and 75 mcg of fentanyl for moderate sedation. The esophageal probe was passed without difficulty.  Images quality was adequate.  FINDINGS:   1. Left ventricle.  There was mild concentric hypertrophy with normal systolic function. Estimated ejection fraction between 55-60%.  2. Aortic valve was structurally normal.  3. Aorta was normal and nondiseased.  4. Mitral valve was also normal without any evidence of vegetation.  5. Left atrium was mildly dilated.  No evidence of left atrial appendage thrombus and  small unilobular appendage was noted with normal emptying velocities.  6. Pulmonary vein.  No evidence of anomalous pulmonary venous return.  7. Atrial septum.  There is a mobile interatrial septum with no evidence of ASD or PFO.  Further details per the echocardiographic report.     Italy Hilty, MD     CH/MEDQ  D:  09/30/2011  T:  10/01/2011  Job:  [id number]  cc:   Thurmon Fair, MD Hillis Range, MD  Electronically Signed by Kirtland Bouchard. HILTY M.D. on 10/04/2011 08:09:34 AM

## 2011-10-08 ENCOUNTER — Telehealth: Payer: Self-pay | Admitting: Internal Medicine

## 2011-10-08 NOTE — Telephone Encounter (Signed)
New problem He said where he has had ablation. He has noticed somed bruising. Please call

## 2011-10-08 NOTE — Telephone Encounter (Signed)
Returned call to patient and let him know it is normal to have some bruising after an ablation He does not have any pain and is feeling fine

## 2011-10-10 NOTE — Discharge Summary (Signed)
NAME:  Charles [name], Charles [name] NO.:  [id number]  MEDICAL RECORD NO.:  [id number]  LOCATION:  2916                         FACILITY:  [facility]  PHYSICIAN:  Hillis Range, MD       DATE OF BIRTH:  [birth year]  DATE OF ADMISSION:  10/01/2011 DATE OF DISCHARGE:  10/02/2011                              DISCHARGE SUMMARY   PATIENT'S PRIMARY CARE PHYSICIAN:  Donzetta Sprung, MD, [place], [place].  PATIENT'S PRIMARY CARDIOLOGIST:  Thurmon Fair, MD  PATIENT'S ELECTROPHYSIOLOGIST:  Hillis Range, MD  PRIMARY DIAGNOSIS:  Atrial fibrillation, status post ablation this admission.  SECONDARY DIAGNOSES: 1. Obstructive sleep apnea. 2. Hypertension. 3. Diabetes. 4. Hyperlipidemia. 5. Obesity. 6. Peripheral vascular disease.  ALLERGIES:  The patient is allergic to NOVOCAIN.  PROCEDURES THIS ADMISSION:  Electrophysiology study and radiofrequency catheter ablation of atrial fibrillation on October 01, 2011 by Dr. Johney Frame.  This demonstrated sinus rhythm upon presentation, successful electomapping and anatomical encircling of all 4 pulmonary veins. The patient had no inducible arrhythmias following ablation both on and off of Isuprel.  The patient had no early apparent complications.  BRIEF HISTORY OF PRESENT ILLNESS:  Charles [name] is a 45 year old male who was referred to Dr. Johney Frame, in the outpatient setting for treatment options for paroxysmal atrial fibrillation.  He was reported initially having fluttering sensation in 2005 which had improved until several months ago.  At that time, he had an increase in frequency and duration of palpitations associated with shortness of breath.  He was evaluated at [facility] and was found to have atrial fibrillation with RVR.  He was placed on Pradaxa and diltiazem.  He converted to sinus rhythm and was discharged at that time.  He then developed recurrent atrial fibrillation and was initiated on sotalol therapy.  He underwent cardioversion  on August 09, 2011 and was found to have early recurrent atrial fibrillation a week later.  Dr. Johney Frame, discussed treatment options with the patient for his atrial fibrillation.  He had failed medical therapy with Coreg, diltiazem, and sotalol.  Alternatives, including antiarrhythmic medications and ablation were reviewed with the patient. The risks, benefits, and alternatives to ablation were discussed and he wished to proceed.  HOSPITAL COURSE:  The patient underwent transesophageal echocardiogram on September 30, 2011 prior to admission for ablation.  This demonstrated an ejection fraction of 55-60% with no evidence of left atrial appendage thrombus.  He therefore was admitted on October 23 for planned ablation of atrial fibrillation.  This was carried out by Dr. Johney Frame, with details outlined above.  The patient was monitored on telemetry overnight which demonstrated sinus rhythm.  His groin incision was without hematoma or bruit.  EKG demonstrated sinus rhythm.  The patient had no focal neurological changes.  Dr. Johney Frame, examined the patient on October 02, 2011 considered stable for discharge to home.  BRIEF DISCHARGE INSTRUCTIONS: 1. Increase activity slowly. 2. No driving for 4 days. 3. Follow a low-sodium, heart-healthy diet. 4. Keep incisions clean and dry.  FOLLOWUP APPOINTMENTS: 1. Dr. Johney Frame on January 06, 2012 at 10 a.m. 2. Dr. Royann Shivers as scheduled. 3. Dr. Reuel Boom as scheduled.  DISCHARGE MEDICATIONS: 1. Protonix 40 mg take 1  tab daily for 6 weeks - this is a new     prescription for the patient. 2. Cetirizine 10 mg daily. 3. Coreg 25 mg twice daily. 4. Fish oil 1200 mg 3 capsules daily. 5. Furosemide 40 mg daily. 6. Glipizide 5 mg twice daily with meals. 7. Glucosamine chondroitin 2 tablets daily. 8. Lisinopril 40 mg daily. 9. Metformin XR 500 mg, take 1 tab daily to be resumed on October 03, 2011. 10.Multivitamin daily. 11.Pradaxa 150 mg twice  daily. 12.Sotalol 120 mg twice daily. 13.Spironolactone 25 mg daily.  DISPOSITION:  The patient was seen and examined by Dr. Johney Frame on October 02, 2011 and considered stable for discharge.  DURATION OF DISCHARGE ENCOUNTER:  35 minutes.     Gypsy Balsam, RN,BSN   ______________________________ Hillis Range, MD    AS/MEDQ  D:  10/02/2011  T:  10/02/2011  Job:  [id number]  cc:   Thurmon Fair, MD Donzetta Sprung, MD  Electronically Signed by Gypsy Balsam RNBSN on 10/10/2011 07:47:13 AM Electronically Signed by Hillis Range MD on 10/10/2011 02:35:14 PM

## 2011-10-10 NOTE — Op Note (Signed)
NAME:  KYM, FENTER NO.:  [id number]  MEDICAL RECORD NO.:  [id number]  LOCATION:  2916                         FACILITY:  [facility]  PHYSICIAN:  Hillis Range, MD       DATE OF BIRTH:  1966/05/17  DATE OF PROCEDURE: DATE OF DISCHARGE:                              OPERATIVE REPORT   PREPROCEDURE DIAGNOSIS:  Paroxysmal atrial fibrillation.  POSTPROCEDURE DIAGNOSIS:  Paroxysmal atrial fibrillation.  PROCEDURES: 1. Comprehensive EP study. 2. Coronary sinus pacing and recording. 3. 3D mapping of SVT. 4. Radiofrequency ablation of SVT. 5. Arterial blood pressure monitoring. 6. Intracardiac echocardiography. 7. Transseptal puncture of an intact septum. 8. Isoproterenol infusion. 9. Three-dimensional rotational angiography with processing at an     independent workstation.  INTRODUCTION:  Mr. Corsino is a pleasant 45 year old gentleman with a history of paroxysmal atrial fibrillation and a nonischemic cardiomyopathy who presents today for atrial fibrillation ablation.  He reports being diagnosed with atrial fibrillation in August of 2012 and retrospect, he feels that he is likely had symptomatic atrial fibrillation for 5 years.  He was initiated on sotalol and underwent cardioversion on August 09, 2011.  He subsequently returned with recurrent atrial fibrillation.  He reports atrial fibrillation every 1-2 weeks with progressive dizziness, weakness, and shortness of breath.  He has failed medical therapy with Coreg, diltiazem, and sotalol.  He therefore presents today for EP study and radiofrequency ablation.  DESCRIPTION OF THE PROCEDURE:  Informed written consent was obtained, and the patient was brought to the electrophysiology lab in the fasting state.  He was adequately sedated with intravenous medications as outlined in the anesthesia report.  The patient's right and left groins were prepped and draped in the usual sterile fashion by the EP lab staff.  Using  a percutaneous Seldinger technique, two 7-French and one 8- [place] hemostasis sheaths were placed in the right common femoral vein. An 11-French hemostasis sheath was placed into the left common femoral vein.  A 4-French hemostasis sheath was placed in the right common femoral artery for blood pressure monitoring.  A 6-French pigtail catheter was introduced into the right common femoral vein and advanced into the inferior vena cava.  Three-dimensional rotational angiography was performed by power injection of 100 mL of nonionic contrast. Processing of the acquired three-dimensional image was then performed at an independent workstation.  This demonstrated a large left atrium.  The patient was noted to have 4 separate pulmonary veins with no common ostia or anomaly is observed.  The pigtail catheter was then removed. In its place, a 7-French [employer] decapolar coronary sinus catheter was introduced through the right common femoral vein and advanced into the coronary sinus for recording and pacing from this location.  A 6-French quadripolar Josephson catheter was introduced through the right common femoral vein and advanced into the right ventricle for recording and pacing.  This catheter was then pulled back to the His bundle location.  The patient presented to the electrophysiology lab in normal sinus rhythm.  His PR interval was 205 msec with a QRS duration of 85 msec and a QT interval of 433 msec.  His H interval measured 126 msec with an  HV interval of 56 msec. Ventricular pacing was performed, which revealed VA dissociation when pacing at a basic cycle length of 600 msec.  An 11-French Nadara Eaton AcuNav intracardiac echocardiography catheter was introduced through the left common femoral vein and advanced into the right atrium. Intracardiac echocardiography was performed which revealed a moderately enlarged left atrium.  There were 4 separate pulmonary veins with  no evidence of pulmonary vein stenosis.  The middle right common femoral vein sheath was exchanged for an 8.5-French SL2 transseptal sheath and transseptal access was achieved in a standard fashion using a Mal Amabile and Brown needle under biplane fluoroscopy with intracardiac echocardiography confirmation of the transseptal puncture.  Once transseptal access had been achieved, heparin was administered intravenously and intra-arterially with a target ACT of greater than 300 seconds.  The middle right common femoral vein sheath was pulled back into the IVC over a guidewire.  A 3.5 mm Biosense Webster EZ-Steer ThermaCool ablation catheter was advanced through the right common femoral vein into the right atrium.  This catheter was then advanced into the left atrium using the transseptal wire as a guide.  The transseptal sheath was then readvanced over the guidewire into the left atrium.  A dual decapolar circular mapping catheter was introduced through the transseptal sheath and positioned over the mouth of all 4 pulmonary veins.  Three-dimensional electrode anatomical mapping was performed using CARTO technology.  This demonstrated electrical activity within all 4 pulmonary veins at baseline.  A three-dimensional rendering of the left atrium was used with CARTO sound and merge technologies and the three-dimensional left atrial shell generated with three-dimensional rotational angiography.  All 4 pulmonary veins were successfully and sequentially, electrically isolated and anatomically encircled using radiofrequency current with a circular mapping catheter as a guide. Following ablation, isoproterenol was infused up to 20 mcg/minute with no inducible atrial fibrillation, atrial flutter, atrial tachycardia, or atrial ectopic beats observed.  Isoproterenol was therefore allowed to wash out.  Atrial pacing was performed, which revealed PR equal to, but not greater than RR with no arrhythmias  observed when pacing down to a cycle length of 220 msec.  The AV Wenckebach cycle length was 400 msec. Following ablation, the AH interval measured 103 msec with an HV interval of 51 msec.  The procedure was therefore considered completed. All catheters were removed and the sheaths were aspirated and flushed. Intracardiac echocardiography was performed again which revealed no pericardial effusion.  The patient was transferred to the recovery area for sheath removal per protocol.  A limited bedside transthoracic echocardiogram revealed no pericardial effusion.  There were no early apparent complications.  CONCLUSIONS: 1. Sinus rhythm upon presentation. 2. Successful electrical isolation and anatomical encircling of all 4     pulmonary veins using radiofrequency current. 3. No inducible arrhythmias following ablation both on and off of     isoproterenol. 4. No early apparent complications.     Hillis Range, MD     JA/MEDQ  D:  10/01/2011  T:  10/01/2011  Job:  [id number]  cc:   Thurmon Fair, MD  Electronically Signed by Hillis Range MD on 10/10/2011 02:35:10 PM

## 2011-10-17 ENCOUNTER — Encounter: Payer: Self-pay | Admitting: Internal Medicine

## 2011-11-22 ENCOUNTER — Other Ambulatory Visit: Payer: Self-pay | Admitting: Cardiovascular Disease

## 2011-12-29 ENCOUNTER — Other Ambulatory Visit: Payer: Self-pay | Admitting: Cardiovascular Disease

## 2011-12-30 NOTE — Telephone Encounter (Signed)
Called patient to find out what doctor prescribed protonix that was on his hospital discharge, is only suppose to be on medication for 6 weeks

## 2011-12-31 NOTE — Telephone Encounter (Signed)
Pt called back , name of dr on rx bottle is cooper, pt asking if he needs to continue the med? pls call [phone]

## 2011-12-31 NOTE — Telephone Encounter (Signed)
I spoke with the pt and made him aware that per October DC summary this medication was only for 6 weeks.  This prescription will be refused.

## 2011-12-31 NOTE — Telephone Encounter (Signed)
This medication was only for 6 weeks per DC summary.

## 2012-01-06 ENCOUNTER — Encounter: Payer: Self-pay | Admitting: Internal Medicine

## 2012-01-06 ENCOUNTER — Ambulatory Visit (INDEPENDENT_AMBULATORY_CARE_PROVIDER_SITE_OTHER): Payer: BC Managed Care – PPO | Admitting: Internal Medicine

## 2012-01-06 DIAGNOSIS — I4891 Unspecified atrial fibrillation: Secondary | ICD-10-CM

## 2012-01-06 DIAGNOSIS — I519 Heart disease, unspecified: Secondary | ICD-10-CM

## 2012-01-06 DIAGNOSIS — I10 Essential (primary) hypertension: Secondary | ICD-10-CM | POA: Insufficient documentation

## 2012-01-06 MED ORDER — FUROSEMIDE 40 MG PO TABS: 20.0000 mg | ORAL_TABLET | Freq: Every day | ORAL | Status: DC

## 2012-01-06 NOTE — Assessment & Plan Note (Signed)
Doing very well s/p ablation Prior to ablation, he was approaching persistent afib.  I will continue sotalol for an additional 3 months to allow his atria to remodel further. His CHADS2 score is 3 (CHF, DM, and HTN).  He will therefore continue pradaxa.

## 2012-01-06 NOTE — Progress Notes (Signed)
Primary Cardiologist:  Dr Royann Shivers  The patient presents today for routine electrophysiology followup.  Since his afib ablation, the patient reports doing very well.  He feels that his afib is much improved.  He denies procedure related complications.  Today, he denies symptoms of chest pain, shortness of breath, orthopnea, PND, lower extremity edema, presyncope, syncope, or neurologic sequela.  He has occasional postural dizziness. The patient feels that he is tolerating medications without difficulties and is otherwise without complaint today.   Past Medical History  Diagnosis Date  . Atrial fibrillation   . Nonischemic cardiomyopathy     39% - echo on 07/20/2011 -- EF of 45-50% and grade  2 diastolic dysfunction.  . Chronic systolic dysfunction of left ventricle   . Obstructive sleep apnea     noncompliant with CPAP  . Hypertension   . Diabetes mellitus     A1c of 8% is an improvement from the previous level of 11%  . Hyperlipidemia   . Obesity   . Peripheral vascular disease    No past surgical history on file.  Current Outpatient Prescriptions  Medication Sig Dispense Refill  . carvedilol (COREG) 25 MG tablet Take 25 mg by mouth 2 (two) times daily with a meal.      . cetirizine (ZYRTEC) 10 MG tablet Take 10 mg by mouth daily.        . dabigatran (PRADAXA) 150 MG CAPS Take 75 mg by mouth every 12 (twelve) hours.        . fluticasone (FLONASE) 50 MCG/ACT nasal spray Place 1 spray into the nose daily.       . furosemide (LASIX) 40 MG tablet Take 40 mg by mouth daily.       Marland Kitchen glipiZIDE (GLUCOTROL) 5 MG tablet Take 5 mg by mouth 2 (two) times daily before a meal.       . Glucosamine-Chondroitin 1500-1200 MG/30ML LIQD Take 1 capsule by mouth daily.        Marland Kitchen lisinopril (PRINIVIL,ZESTRIL) 40 MG tablet Take 40 mg by mouth daily.       . metFORMIN (GLUCOPHAGE-XR) 500 MG 24 hr tablet Take 500 mg by mouth daily with breakfast.        . multivitamin (THERAGRAN) per tablet Take 1 tablet by  mouth daily.        . Omega-3 Fatty Acids (FISH OIL) 1200 MG CAPS Take 1 capsule by mouth 3 (three) times daily.        . sotalol (BETAPACE) 120 MG tablet Take 120 mg by mouth 2 (two) times daily.       Marland Kitchen spironolactone (ALDACTONE) 25 MG tablet Take 25 mg by mouth daily.         Allergies  Allergen Reactions  . Novocain Nausea And Vomiting    History   Social History  . Marital Status: Divorced    Spouse Name: N/A    Number of Children: N/A  . Years of Education: N/A   Occupational History  . Not on file.   Social History Main Topics  . Smoking status: Never Smoker   . Smokeless tobacco: Never Used  . Alcohol Use: No  . Drug Use: No  . Sexually Active: Not on file   Other Topics Concern  . 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 Coronary artery disease Sister  with multiple stents  . Coronary artery disease      in multiple family members  . Hypertension Mother   . Hypertension Brother   . Diabetes Mother   . Diabetes Sister   . Diabetes Brother   . Alcohol abuse Father     Physical Exam: Filed Vitals:   01/06/12 4782 01/06/12 0941 01/06/12 0942 01/06/12 0943  BP:  136/84 129/85 136/84  Pulse:  80 87 87  Height: 6\' 3"  (1.905 m)   6\' 3"  (1.905 m)  Weight: 293 lb (132.904 kg)   293 lb (132.904 kg)    GEN- The patient is well appearing, alert and oriented x 3 today.   Head- normocephalic, atraumatic Eyes-  Sclera clear, conjunctiva pink Ears- hearing intact Oropharynx- clear Neck- supple, no JVP Lymph- no cervical lymphadenopathy Lungs- Clear to ausculation bilaterally, normal work of breathing Heart- Regular rate and rhythm, no murmurs, rubs or gallops, PMI not laterally displaced GI- soft, NT, ND, + BS Extremities- no clubbing, cyanosis, or edema MS- no significant deformity or atrophy Skin- no rash or lesion Psych- euthymic mood, full  affect Neuro- strength and sensation are intact  ekg today reveals sinus rhythm 83 bpm with PACs, PR 208, LAD  Assessment and Plan:

## 2012-01-06 NOTE — Assessment & Plan Note (Signed)
Follow bp closely with decreased lasix.   Return in 3 months

## 2012-01-06 NOTE — Assessment & Plan Note (Signed)
Doing well s/p ablation He has occasional postural dizziness Decrease lasix to 20mg  daily

## 2012-01-06 NOTE — Patient Instructions (Signed)
Your physician has recommended you make the following change in your medication: Decrease lasix to 20mg  (1/2 tablet) daily  Your physician recommends that you schedule a follow-up appointment in: 3 months with Dr Johney Frame.

## 2012-04-03 ENCOUNTER — Encounter: Payer: Self-pay | Admitting: Internal Medicine

## 2012-04-03 ENCOUNTER — Ambulatory Visit (INDEPENDENT_AMBULATORY_CARE_PROVIDER_SITE_OTHER): Payer: BC Managed Care – PPO | Admitting: Internal Medicine

## 2012-04-03 VITALS — BP 120/85 | HR 75 | Ht 75.0 in | Wt 292.4 lb

## 2012-04-03 DIAGNOSIS — I4891 Unspecified atrial fibrillation: Secondary | ICD-10-CM

## 2012-04-03 NOTE — Patient Instructions (Signed)
Your physician wants you to follow-up in: 4 months with Dr Jacquiline Doe will receive a reminder letter in the mail two months in advance. If you don't receive a letter, please call our office to schedule the follow-up appointment.  Your physician has recommended you make the following change in your medication:  1) Stop Sotalol

## 2012-04-03 NOTE — Progress Notes (Signed)
Primary Cardiologist:  Dr Royann Shivers  The patient presents today for routine electrophysiology followup. He has had no symptoms of afib since his ablation.  Today, he denies symptoms of chest pain, shortness of breath, orthopnea, PND, lower extremity edema, presyncope, syncope, or neurologic sequela.  He has occasional postural dizziness. The patient feels that he is tolerating medications without difficulties and is otherwise without complaint today.   Past Medical History  Diagnosis Date  . Atrial fibrillation   . Nonischemic cardiomyopathy     39% - echo on 07/20/2011 -- EF of 45-50% and grade  2 diastolic dysfunction.  . Chronic systolic dysfunction of left ventricle   . Obstructive sleep apnea     noncompliant with CPAP  . Hypertension   . Diabetes mellitus     A1c of 8% is an improvement from the previous level of 11%  . Hyperlipidemia   . Obesity   . Peripheral vascular disease    No past surgical history on file.  Current Outpatient Prescriptions  Medication Sig Dispense Refill  . carvedilol (COREG) 25 MG tablet Take 25 mg by mouth 2 (two) times daily with a meal.      . cetirizine (ZYRTEC) 10 MG tablet Take 10 mg by mouth daily.        . furosemide (LASIX) 40 MG tablet Take 0.5 tablets (20 mg total) by mouth daily.  30 tablet  6  . glipiZIDE (GLUCOTROL) 5 MG tablet Take 5 mg by mouth 2 (two) times daily before a meal.       . Glucosamine-Chondroitin 1500-1200 MG/30ML LIQD Take 1 capsule by mouth daily.        Marland Kitchen lisinopril (PRINIVIL,ZESTRIL) 40 MG tablet Take 40 mg by mouth daily.       . metFORMIN (GLUCOPHAGE-XR) 500 MG 24 hr tablet Take 500 mg by mouth daily with breakfast.        . multivitamin (THERAGRAN) per tablet Take 1 tablet by mouth daily.        . Omega-3 Fatty Acids (FISH OIL) 1200 MG CAPS Take 1 capsule by mouth 3 (three) times daily.        Marland Kitchen spironolactone (ALDACTONE) 25 MG tablet Take 25 mg by mouth daily.       . dabigatran (PRADAXA) 150 MG CAPS Take 1 capsule  (150 mg total) by mouth every 12 (twelve) hours.  60 capsule    . fluticasone (FLONASE) 50 MCG/ACT nasal spray Place 1 spray into the nose daily.         Allergies  Allergen Reactions  . Novocain Nausea And Vomiting    History   Social History  . Marital Status: Divorced    Spouse Name: N/A    Number of Children: N/A  . Years of Education: N/A   Occupational History  . Not on file.   Social History Main Topics  . Smoking status: Never Smoker   . Smokeless tobacco: Never Used  . Alcohol Use: No  . Drug Use: No  . Sexually Active: Not on file   Other Topics Concern  . 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 Coronary artery disease Sister     with multiple stents  . Coronary artery disease      in multiple family members  . Hypertension Mother   . Hypertension Brother   . Diabetes Mother   .  Diabetes Sister   . Diabetes Brother   . Alcohol abuse Father     Physical Exam: Filed Vitals:   04/03/12 0857  BP: 120/85  Pulse: 75  Height: 6\' 3"  (1.905 m)  Weight: 292 lb 6.4 oz (132.632 kg)    GEN- The patient is well appearing, alert and oriented x 3 today.   Head- normocephalic, atraumatic Eyes-  Sclera clear, conjunctiva pink Ears- hearing intact Oropharynx- clear Neck- supple, no JVP Lymph- no cervical lymphadenopathy Lungs- Clear to ausculation bilaterally, normal work of breathing Heart- Regular rate and rhythm, no murmurs, rubs or gallops, PMI not laterally displaced GI- soft, NT, ND, + BS Extremities- no clubbing, cyanosis, or edema MS- no significant deformity or atrophy Skin- no rash or lesion Psych- euthymic mood, full affect Neuro- strength and sensation are intact  ekg today reveals sinus rhythm 75 bpm, PR 206, nonspecific ST/T changes  Assessment and Plan:

## 2012-04-03 NOTE — Assessment & Plan Note (Signed)
Doing well without recurrence s/p ablation  Stop sotalol today Continue pradaxa (CHADS2 score is 3)  Return in 4 months Follow-up with Dr Rubie Maid as scheduled.

## 2012-08-07 ENCOUNTER — Encounter: Payer: Self-pay | Admitting: Internal Medicine

## 2012-08-07 ENCOUNTER — Ambulatory Visit (INDEPENDENT_AMBULATORY_CARE_PROVIDER_SITE_OTHER): Payer: BC Managed Care – PPO | Admitting: Internal Medicine

## 2012-08-07 VITALS — BP 118/84 | HR 74 | Resp 18 | Ht 75.0 in | Wt 287.4 lb

## 2012-08-07 DIAGNOSIS — I519 Heart disease, unspecified: Secondary | ICD-10-CM

## 2012-08-07 DIAGNOSIS — I4891 Unspecified atrial fibrillation: Secondary | ICD-10-CM

## 2012-08-07 NOTE — Patient Instructions (Addendum)
Your physician wants you to follow-up in: 12 months with Dr Jacquiline Doe will receive a reminder letter in the mail two months in advance. If you don't receive a letter, please call our office to schedule the follow-up appointment.   Your physician has requested that you have an echocardiogram. Echocardiography is a painless test that uses sound waves to create images of your heart. It provides your doctor with information about the size and shape of your heart and how well your heart's chambers and valves are working. This procedure takes approximately one hour. There are no restrictions for this procedure.---Dr Renaye Rakers office to do test

## 2012-08-07 NOTE — Progress Notes (Signed)
Primary Cardiologist:  Dr Gardiner Fanti is a 46 y.o. male who presents today for routine electrophysiology followup.  Since last being seen in our clinic, the patient reports doing very well.  He has had no symptomatic afib since his ablation.  He remains very active and is pleased with his present health state. Today, he denies symptoms of palpitations, chest pain, shortness of breath,  lower extremity edema, dizziness, presyncope, or syncope. He has erectile dysfunction, likely due to coreg.  The patient is otherwise without complaint today.   Past Medical History  Diagnosis Date  . Atrial fibrillation   . Nonischemic cardiomyopathy     39% - echo on 07/20/2011 -- EF of 45-50% and grade  2 diastolic dysfunction.  . Chronic systolic dysfunction of left ventricle   . Obstructive sleep apnea     noncompliant with CPAP  . Hypertension   . Diabetes mellitus     A1c of 8% is an improvement from the previous level of 11%  . Hyperlipidemia   . Obesity   . Peripheral vascular disease    No past surgical history on file.  Current Outpatient Prescriptions  Medication Sig Dispense Refill  . carvedilol (COREG) 25 MG tablet Take 25 mg by mouth 2 (two) times daily with a meal.      . cetirizine (ZYRTEC) 10 MG tablet Take 10 mg by mouth daily.        . dabigatran (PRADAXA) 150 MG CAPS Take 1 capsule (150 mg total) by mouth every 12 (twelve) hours.  60 capsule    . fluticasone (FLONASE) 50 MCG/ACT nasal spray Place 1 spray into the nose daily.       . furosemide (LASIX) 40 MG tablet Take 0.5 tablets (20 mg total) by mouth daily.  30 tablet  6  . glipiZIDE (GLUCOTROL) 5 MG tablet Take 5 mg by mouth 2 (two) times daily before a meal.       . lisinopril (PRINIVIL,ZESTRIL) 40 MG tablet Take 40 mg by mouth daily.       . metFORMIN (GLUCOPHAGE-XR) 500 MG 24 hr tablet Take 500 mg by mouth daily with breakfast.        . multivitamin (THERAGRAN) per tablet Take 1 tablet by mouth daily.        Marland Kitchen  spironolactone (ALDACTONE) 25 MG tablet Take 25 mg by mouth daily.         Physical Exam: Filed Vitals:   08/07/12 0932  BP: 118/84  Pulse: 74  Resp: 18  Height: 6\' 3"  (1.905 m)  Weight: 287 lb 6.4 oz (130.364 kg)  SpO2: 97%    GEN- The patient is well appearing, alert and oriented x 3 today.   Head- normocephalic, atraumatic Eyes-  Sclera clear, conjunctiva pink Ears- hearing intact Oropharynx- clear Lungs- Clear to ausculation bilaterally, normal work of breathing Heart- Regular rate and rhythm, no murmurs, rubs or gallops, PMI not laterally displaced GI- soft, NT, ND, + BS Extremities- no clubbing, cyanosis, or edema  ekg today reveals sinus rhythm 74 bpm, PR 200, QRS 80, Qtc 426,   Assessment and Plan:

## 2012-08-07 NOTE — Assessment & Plan Note (Signed)
I have instructed him to follow-up with Dr Royann Shivers and have a repeat echo to evaluate EF. If EF is improved, he may benefit from reduction of coreg given ED.  Follow-up with Dr C for routine cardiology care. I will see again in 1 year

## 2012-08-07 NOTE — Assessment & Plan Note (Signed)
Maintaining sinus rhythm off of AAD since his ablation Very pleased with his results  Given diabetes and h/o CHF, should continue long term anticoagulation at this point

## 2012-08-14 NOTE — Addendum Note (Signed)
Addended by: Lacie Scotts on: 08/14/2012 03:48 PM   Modules accepted: Orders

## 2013-03-06 IMAGING — CR DG CHEST 1V PORT
1 series · 1 of 1 positions shown · non-contrast
Comparison: None.

CLINICAL DATA: Chest pain/tightness, shortness of breath

PORTABLE CHEST - 1 VIEW

[view not recorded]
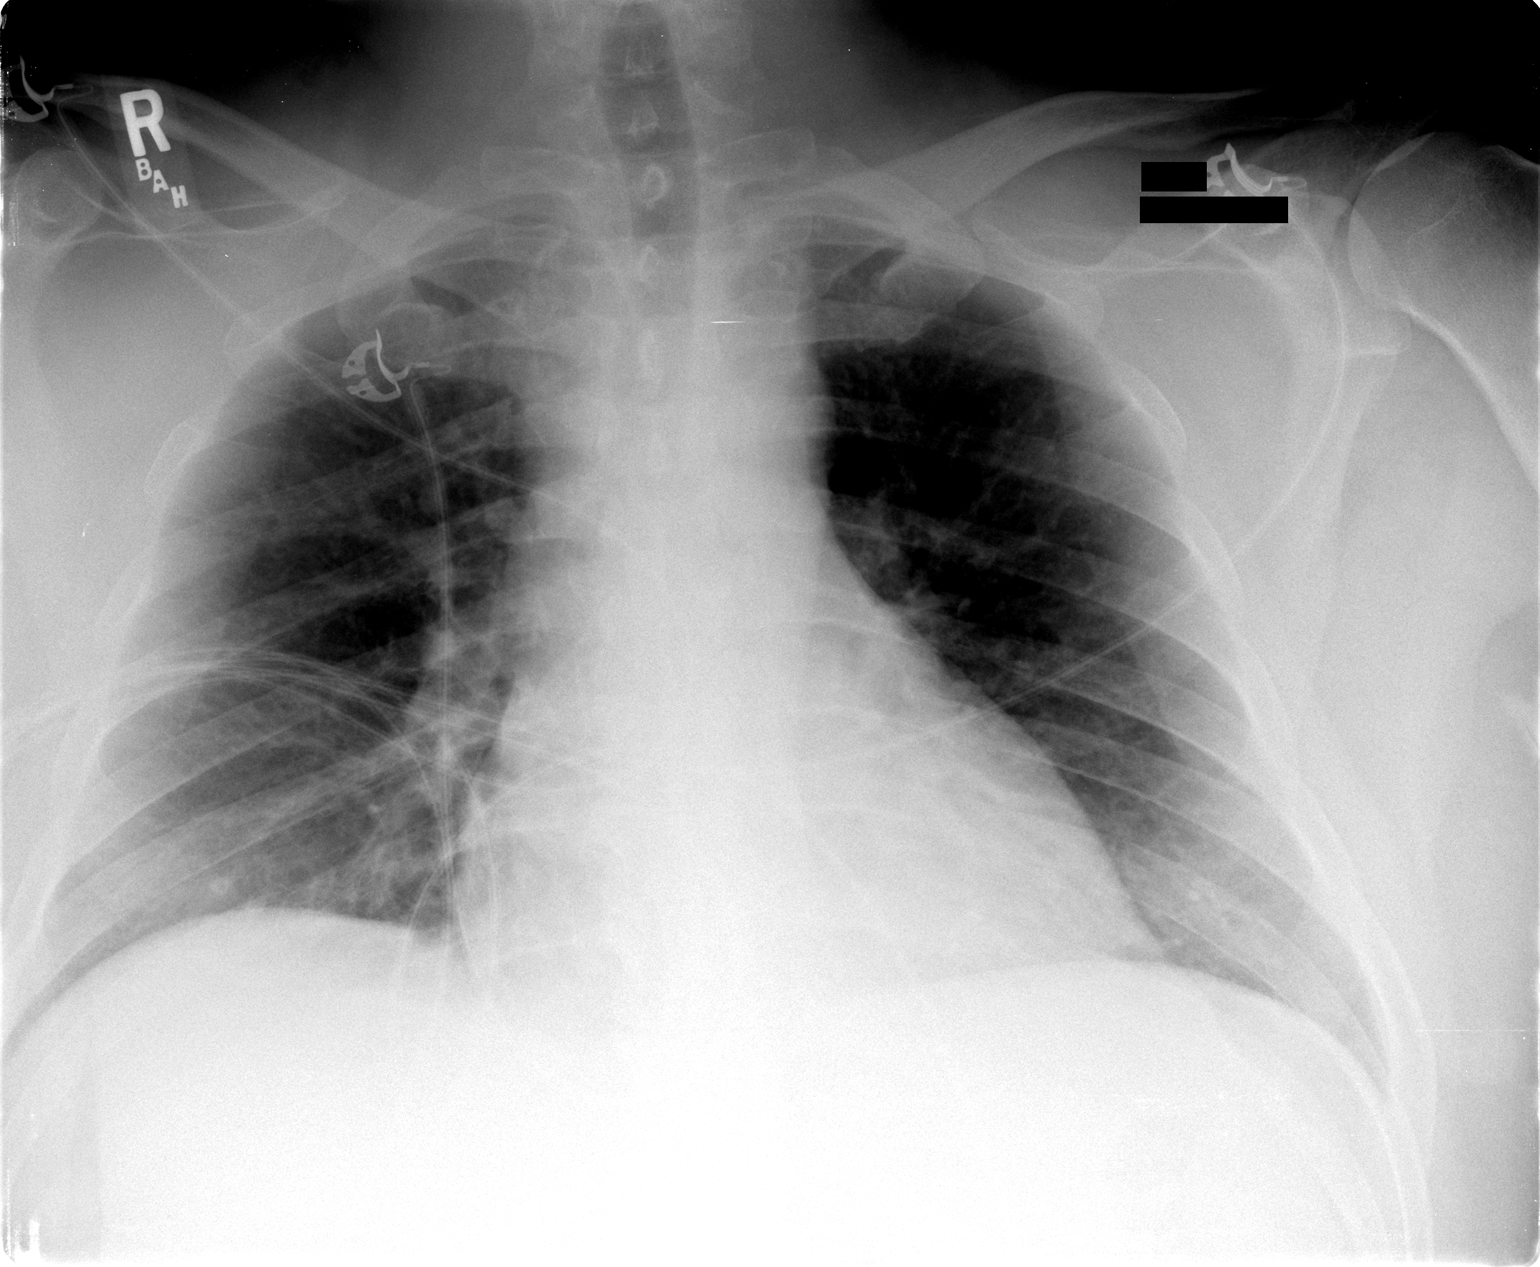

[1 of 1 positions shown; findings below may reference images not displayed]

FINDINGS: Lungs are clear. No pleural effusion or pneumothorax.

Cardiomegaly.
IMPRESSION: No evidence of acute cardiopulmonary disease.

Cardiomegaly.

## 2013-03-14 ENCOUNTER — Encounter: Payer: Self-pay | Admitting: *Deleted

## 2013-03-15 ENCOUNTER — Encounter: Payer: Self-pay | Admitting: Cardiovascular Disease

## 2014-02-15 ENCOUNTER — Other Ambulatory Visit: Payer: Self-pay | Admitting: *Deleted

## 2014-02-15 MED ORDER — AMLODIPINE BESYLATE 10 MG PO TABS: 10.0000 mg | ORAL_TABLET | Freq: Every day | ORAL | Status: DC

## 2014-02-15 NOTE — Telephone Encounter (Signed)
Rx was sent to pharmacy electronically. 

## 2014-04-28 ENCOUNTER — Other Ambulatory Visit: Payer: Self-pay | Admitting: *Deleted

## 2015-01-20 ENCOUNTER — Telehealth: Payer: Self-pay | Admitting: *Deleted

## 2015-01-20 NOTE — Telephone Encounter (Signed)
Returned  order for CPAP and [facility].

## 2016-04-04 DIAGNOSIS — Z1211 Encounter for screening for malignant neoplasm of colon: Secondary | ICD-10-CM | POA: Diagnosis not present

## 2016-04-22 DIAGNOSIS — Z833 Family history of diabetes mellitus: Secondary | ICD-10-CM | POA: Diagnosis not present

## 2016-04-22 DIAGNOSIS — G4730 Sleep apnea, unspecified: Secondary | ICD-10-CM | POA: Diagnosis not present

## 2016-04-22 DIAGNOSIS — E119 Type 2 diabetes mellitus without complications: Secondary | ICD-10-CM | POA: Diagnosis not present

## 2016-04-22 DIAGNOSIS — Z823 Family history of stroke: Secondary | ICD-10-CM | POA: Diagnosis not present

## 2016-04-22 DIAGNOSIS — Z1211 Encounter for screening for malignant neoplasm of colon: Secondary | ICD-10-CM | POA: Diagnosis not present

## 2016-04-22 DIAGNOSIS — Z8249 Family history of ischemic heart disease and other diseases of the circulatory system: Secondary | ICD-10-CM | POA: Diagnosis not present

## 2016-04-22 DIAGNOSIS — Z79899 Other long term (current) drug therapy: Secondary | ICD-10-CM | POA: Diagnosis not present

## 2016-04-22 DIAGNOSIS — I10 Essential (primary) hypertension: Secondary | ICD-10-CM | POA: Diagnosis not present

## 2016-04-22 DIAGNOSIS — Z7984 Long term (current) use of oral hypoglycemic drugs: Secondary | ICD-10-CM | POA: Diagnosis not present

## 2016-08-21 DIAGNOSIS — E782 Mixed hyperlipidemia: Secondary | ICD-10-CM | POA: Diagnosis not present

## 2016-08-21 DIAGNOSIS — E1165 Type 2 diabetes mellitus with hyperglycemia: Secondary | ICD-10-CM | POA: Diagnosis not present

## 2016-08-21 DIAGNOSIS — I10 Essential (primary) hypertension: Secondary | ICD-10-CM | POA: Diagnosis not present

## 2016-08-26 DIAGNOSIS — E6609 Other obesity due to excess calories: Secondary | ICD-10-CM | POA: Diagnosis not present

## 2016-08-26 DIAGNOSIS — E1165 Type 2 diabetes mellitus with hyperglycemia: Secondary | ICD-10-CM | POA: Diagnosis not present

## 2016-08-26 DIAGNOSIS — Z0001 Encounter for general adult medical examination with abnormal findings: Secondary | ICD-10-CM | POA: Diagnosis not present

## 2016-08-26 DIAGNOSIS — Z1389 Encounter for screening for other disorder: Secondary | ICD-10-CM | POA: Diagnosis not present

## 2016-08-26 DIAGNOSIS — G4733 Obstructive sleep apnea (adult) (pediatric): Secondary | ICD-10-CM | POA: Diagnosis not present

## 2016-08-26 DIAGNOSIS — E782 Mixed hyperlipidemia: Secondary | ICD-10-CM | POA: Diagnosis not present

## 2016-10-10 DIAGNOSIS — E119 Type 2 diabetes mellitus without complications: Secondary | ICD-10-CM | POA: Diagnosis not present

## 2016-10-10 DIAGNOSIS — H35413 Lattice degeneration of retina, bilateral: Secondary | ICD-10-CM | POA: Diagnosis not present

## 2016-10-10 DIAGNOSIS — Z7984 Long term (current) use of oral hypoglycemic drugs: Secondary | ICD-10-CM | POA: Diagnosis not present

## 2016-10-10 DIAGNOSIS — H43813 Vitreous degeneration, bilateral: Secondary | ICD-10-CM | POA: Diagnosis not present

## 2016-11-14 DIAGNOSIS — L910 Hypertrophic scar: Secondary | ICD-10-CM | POA: Diagnosis not present

## 2016-11-14 DIAGNOSIS — M7130 Other bursal cyst, unspecified site: Secondary | ICD-10-CM | POA: Diagnosis not present

## 2016-12-13 DIAGNOSIS — H2513 Age-related nuclear cataract, bilateral: Secondary | ICD-10-CM | POA: Diagnosis not present

## 2016-12-13 DIAGNOSIS — H43823 Vitreomacular adhesion, bilateral: Secondary | ICD-10-CM | POA: Diagnosis not present

## 2016-12-13 DIAGNOSIS — H43393 Other vitreous opacities, bilateral: Secondary | ICD-10-CM | POA: Diagnosis not present

## 2017-01-01 DIAGNOSIS — E6609 Other obesity due to excess calories: Secondary | ICD-10-CM | POA: Diagnosis not present

## 2017-01-01 DIAGNOSIS — E782 Mixed hyperlipidemia: Secondary | ICD-10-CM | POA: Diagnosis not present

## 2017-01-01 DIAGNOSIS — E1165 Type 2 diabetes mellitus with hyperglycemia: Secondary | ICD-10-CM | POA: Diagnosis not present

## 2017-01-01 DIAGNOSIS — I10 Essential (primary) hypertension: Secondary | ICD-10-CM | POA: Diagnosis not present

## 2017-01-01 DIAGNOSIS — G4733 Obstructive sleep apnea (adult) (pediatric): Secondary | ICD-10-CM | POA: Diagnosis not present

## 2017-02-11 DIAGNOSIS — H2513 Age-related nuclear cataract, bilateral: Secondary | ICD-10-CM | POA: Diagnosis not present

## 2017-02-11 DIAGNOSIS — H02839 Dermatochalasis of unspecified eye, unspecified eyelid: Secondary | ICD-10-CM | POA: Diagnosis not present

## 2017-02-11 DIAGNOSIS — H18411 Arcus senilis, right eye: Secondary | ICD-10-CM | POA: Diagnosis not present

## 2017-02-11 DIAGNOSIS — I10 Essential (primary) hypertension: Secondary | ICD-10-CM | POA: Diagnosis not present

## 2017-02-20 ENCOUNTER — Telehealth: Payer: Self-pay | Admitting: Internal Medicine

## 2017-02-20 ENCOUNTER — Telehealth: Payer: Self-pay

## 2017-02-20 NOTE — Telephone Encounter (Signed)
Pt will need to be seen before clearance will be given. Lm for pt to call back. Attempted to contact Marcelino Duster, Health visitor, at [facility]. Phone doesn't ring and immediately disconnects. Sent fax to Michelle's attention requesting surgeon who is performing surgery and to notify them that pt needs an appt.

## 2017-02-20 NOTE — Telephone Encounter (Signed)
Pt is returning Charles [name]'s call from today.

## 2017-02-20 NOTE — Telephone Encounter (Signed)
Request for surgical clearance:   1. What type surgery is being performed? Cataract extraction w/ intraocular lens implantation of the right eye, followed by the left eye  2. When is this surgery scheduled? 03/28/17  3. Are there any medications that need to be held prior to surgery and how long? N/A; per our records (last updated 07/2012) pt takes Pradaxa 150 mg BID  4. Name of the physician performing surgery: not indicated on form  5. What is the office phone and fax number?   Phone [phone]  Fax [phone]

## 2017-02-20 NOTE — Telephone Encounter (Signed)
New message     Pt is returning call, not sure what it is pertaining too. Thinks it may be due to having eye surgery soon.

## 2017-02-20 NOTE — Telephone Encounter (Signed)
Spoke with pt, Follow up scheduled with PA for surgical clearance.

## 2017-02-21 NOTE — Telephone Encounter (Signed)
Received return fax from [place] at [facility]. Dr Mia Creek is the physician performing pt's surgery.

## 2017-02-21 NOTE — Telephone Encounter (Signed)
See other telephone encounter from 02/20/17.

## 2017-03-05 ENCOUNTER — Encounter: Payer: Self-pay | Admitting: Cardiology

## 2017-03-05 ENCOUNTER — Ambulatory Visit (INDEPENDENT_AMBULATORY_CARE_PROVIDER_SITE_OTHER): Payer: BLUE CROSS/BLUE SHIELD | Admitting: Cardiology

## 2017-03-05 VITALS — BP 136/80 | HR 81 | Ht 75.0 in | Wt 269.4 lb

## 2017-03-05 DIAGNOSIS — Z01818 Encounter for other preprocedural examination: Secondary | ICD-10-CM | POA: Diagnosis not present

## 2017-03-05 DIAGNOSIS — I480 Paroxysmal atrial fibrillation: Secondary | ICD-10-CM | POA: Diagnosis not present

## 2017-03-05 DIAGNOSIS — G4730 Sleep apnea, unspecified: Secondary | ICD-10-CM | POA: Diagnosis not present

## 2017-03-05 NOTE — Patient Instructions (Addendum)
Your physician recommends that you continue on your current medications as directed. Please refer to the Current Medication list given to you today.    Your physician recommends that you schedule a follow-up appointment in:  AS NEEDED 

## 2017-03-05 NOTE — Assessment & Plan Note (Signed)
Pt had PAF in 2012. He failed to hold NSR after DCCV and on Sotalol. He underwent AF RFA Oct 2012 by Dr Johney Frame without recurrence.  The pt says Dr Johney Frame stopped his anticoagulation and beta blocker in 2013, he has not had recurrent PAF by history.

## 2017-03-05 NOTE — Assessment & Plan Note (Signed)
He does not use C-pap

## 2017-03-05 NOTE — Progress Notes (Signed)
03/05/2017 Charles [name]   [birth year]  [id number]  Primary Physician PROVIDER NOT IN SYSTEM Primary Cardiologist: Dr Johney Frame  HPI:  51 y/o male with a history of AF, s/p RFA Oct 2012. He has held NSR since by his history, we have not seen him since 2013. He was sent by Dr Orvan Falconer for cardiac clearance prior to cataract surgery. He denies any palpitations, chest pain, or unusual dyspnea. He works at a [employer] and is very phisically active.    Current Outpatient Prescriptions  Medication Sig Dispense Refill  . amLODipine (NORVASC) 10 MG tablet Take 1 tablet (10 mg total) by mouth daily. <please make appointment> 30 tablet 1  . glipiZIDE (GLUCOTROL) 5 MG tablet Take 5 mg by mouth 2 (two) times daily before a meal.     . lisinopril (PRINIVIL,ZESTRIL) 40 MG tablet Take 40 mg by mouth daily.     . metFORMIN (GLUCOPHAGE-XR) 500 MG 24 hr tablet Take 500 mg by mouth daily with breakfast.      . multivitamin (THERAGRAN) per tablet Take 1 tablet by mouth daily.      Marland Kitchen FARXIGA 10 MG TABS tablet Take 1 tablet by mouth daily.  1   No current facility-administered medications for this visit.     Allergies  Allergen Reactions  . Procaine Hcl Nausea And Vomiting    Past Medical History:  Diagnosis Date  . Atrial fibrillation (HCC)   . Chronic systolic dysfunction of left ventricle   . Diabetes mellitus    A1c of 8% is an improvement from the previous level of 11%  . Hyperlipidemia   . Hypertension   . LVH (left ventricular hypertrophy)    Echo 08/3012-mild LVH  . Nonischemic cardiomyopathy (HCC)    39% - echo on 07/20/2011 -- EF of 45-50% and grade  2 diastolic dysfunction.  . Obesity   . Obstructive sleep apnea    noncompliant with CPAP  . Peripheral vascular disease [facility])     Social History   Social History  . Marital status: Divorced    Spouse name: N/A  . Number of children: N/A  . Years of education: N/A   Occupational History  . Not on file.   Social History Main  Topics  . Smoking status: Never Smoker  . Smokeless tobacco: Never Used  . Alcohol use Yes     Comment: seldom  . Drug use: No  . Sexual activity: Not on file   Other Topics Concern  . 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 Hypertension Mother   . Diabetes Mother   . Alcohol abuse Father   . Coronary artery disease Sister     with multiple stents  . Coronary artery disease      in multiple family members  . Hypertension Brother   . Diabetes Sister   . Diabetes Brother      Review of Systems: General: negative for chills, fever, night sweats or weight changes.  Cardiovascular: negative for chest pain, dyspnea on exertion, edema, orthopnea, palpitations, paroxysmal nocturnal dyspnea or shortness of breath Dermatological: negative for rash Respiratory: negative for cough or wheezing Urologic: negative for hematuria Abdominal: negative for nausea, vomiting, diarrhea, bright red blood per rectum, melena, or hematemesis Neurologic: negative for visual changes, syncope, or dizziness All other systems  reviewed and are otherwise negative except as noted above.    Blood pressure 136/80, pulse 81, height 6\' 3"  (1.905 m), weight 269 lb 6.4 oz (122.2 kg).  General appearance: alert, cooperative, no distress and moderately obese Neck: no carotid bruit and no JVD Lungs: clear to auscultation bilaterally Heart: regular rate and rhythm Abdomen: obese, non tender Extremities: extremities normal, atraumatic, no cyanosis or edema Pulses: 2+ and symmetric Skin: Skin color, texture, turgor normal. No rashes or lesions Neurologic: Grossly normal  EKG NSR, PVC  ASSESSMENT AND PLAN:   Pre-operative clearance Pt sent for pre op clearance prior to cataract surgery  PAF (paroxysmal atrial fibrillation) (HCC) Pt had PAF in 2012. He failed to hold NSR after DCCV and on  Sotalol. He underwent AF RFA Oct 2012 by Dr Johney Frame without recurrence.  The pt says Dr Johney Frame stopped his anticoagulation and beta blocker in 2013, he has not had recurrent PAF by history.  Sleep apnea He does not use C-pap   PLAN  Discussed with Dr Antoine Poche. The pt is cleared for cataract surgery from a cardiovascular standpoint. He can f/u PRN. We did encourage wgt loss and asked him to discuss statin Rx with his PCP. The pt has DM and a FM Hx of CAD- his F died in his 59s of an MI.   Corine Shelter PA-C 03/05/2017 4:08 PM

## 2017-03-05 NOTE — Assessment & Plan Note (Signed)
Pt sent for pre op clearance prior to cataract surgery

## 2017-03-11 ENCOUNTER — Telehealth: Payer: Self-pay | Admitting: Cardiology

## 2017-03-11 NOTE — Telephone Encounter (Signed)
°   New message  ° ° pt verbalized that he is calling to speak to rn   °

## 2017-03-11 NOTE — Telephone Encounter (Signed)
Returned call to patient He saw [place] PA on 3/28 for surgical clearance for eye surgery  He states eye doc has not received clearance Dr. Vonna Kotyk @ [facility]   Routed [place], [place] office note, which dictates that patient is cleared for surgery.  Patient aware this was sent via EPIC fax to Dr. Vonna Kotyk

## 2017-03-11 NOTE — Telephone Encounter (Signed)
Follow up ° ° °Pt calling back °

## 2017-03-11 NOTE — Telephone Encounter (Signed)
lmtcb

## 2017-03-28 DIAGNOSIS — H25811 Combined forms of age-related cataract, right eye: Secondary | ICD-10-CM | POA: Diagnosis not present

## 2017-03-28 DIAGNOSIS — H2511 Age-related nuclear cataract, right eye: Secondary | ICD-10-CM | POA: Diagnosis not present

## 2017-04-30 DIAGNOSIS — G4733 Obstructive sleep apnea (adult) (pediatric): Secondary | ICD-10-CM | POA: Diagnosis not present

## 2017-04-30 DIAGNOSIS — E1165 Type 2 diabetes mellitus with hyperglycemia: Secondary | ICD-10-CM | POA: Diagnosis not present

## 2017-04-30 DIAGNOSIS — Z9189 Other specified personal risk factors, not elsewhere classified: Secondary | ICD-10-CM | POA: Diagnosis not present

## 2017-04-30 DIAGNOSIS — I10 Essential (primary) hypertension: Secondary | ICD-10-CM | POA: Diagnosis not present

## 2017-04-30 DIAGNOSIS — E782 Mixed hyperlipidemia: Secondary | ICD-10-CM | POA: Diagnosis not present

## 2017-05-14 DIAGNOSIS — G4733 Obstructive sleep apnea (adult) (pediatric): Secondary | ICD-10-CM | POA: Diagnosis not present

## 2017-05-14 DIAGNOSIS — E782 Mixed hyperlipidemia: Secondary | ICD-10-CM | POA: Diagnosis not present

## 2017-05-14 DIAGNOSIS — E6609 Other obesity due to excess calories: Secondary | ICD-10-CM | POA: Diagnosis not present

## 2017-05-14 DIAGNOSIS — E1165 Type 2 diabetes mellitus with hyperglycemia: Secondary | ICD-10-CM | POA: Diagnosis not present

## 2017-05-27 DIAGNOSIS — C44612 Basal cell carcinoma of skin of right upper limb, including shoulder: Secondary | ICD-10-CM | POA: Diagnosis not present

## 2017-09-10 DIAGNOSIS — G4733 Obstructive sleep apnea (adult) (pediatric): Secondary | ICD-10-CM | POA: Diagnosis not present

## 2017-09-10 DIAGNOSIS — E1165 Type 2 diabetes mellitus with hyperglycemia: Secondary | ICD-10-CM | POA: Diagnosis not present

## 2017-09-10 DIAGNOSIS — I10 Essential (primary) hypertension: Secondary | ICD-10-CM | POA: Diagnosis not present

## 2017-09-10 DIAGNOSIS — Z9189 Other specified personal risk factors, not elsewhere classified: Secondary | ICD-10-CM | POA: Diagnosis not present

## 2017-09-10 DIAGNOSIS — E782 Mixed hyperlipidemia: Secondary | ICD-10-CM | POA: Diagnosis not present

## 2017-09-17 DIAGNOSIS — E1165 Type 2 diabetes mellitus with hyperglycemia: Secondary | ICD-10-CM | POA: Diagnosis not present

## 2017-09-17 DIAGNOSIS — G4733 Obstructive sleep apnea (adult) (pediatric): Secondary | ICD-10-CM | POA: Diagnosis not present

## 2017-09-17 DIAGNOSIS — Z1389 Encounter for screening for other disorder: Secondary | ICD-10-CM | POA: Diagnosis not present

## 2017-09-17 DIAGNOSIS — E782 Mixed hyperlipidemia: Secondary | ICD-10-CM | POA: Diagnosis not present

## 2017-09-17 DIAGNOSIS — E6609 Other obesity due to excess calories: Secondary | ICD-10-CM | POA: Diagnosis not present

## 2017-10-23 DIAGNOSIS — C44612 Basal cell carcinoma of skin of right upper limb, including shoulder: Secondary | ICD-10-CM | POA: Diagnosis not present

## 2017-12-15 DIAGNOSIS — I10 Essential (primary) hypertension: Secondary | ICD-10-CM | POA: Diagnosis not present

## 2017-12-15 DIAGNOSIS — Z9189 Other specified personal risk factors, not elsewhere classified: Secondary | ICD-10-CM | POA: Diagnosis not present

## 2017-12-15 DIAGNOSIS — E1165 Type 2 diabetes mellitus with hyperglycemia: Secondary | ICD-10-CM | POA: Diagnosis not present

## 2017-12-15 DIAGNOSIS — E782 Mixed hyperlipidemia: Secondary | ICD-10-CM | POA: Diagnosis not present

## 2017-12-18 DIAGNOSIS — G4733 Obstructive sleep apnea (adult) (pediatric): Secondary | ICD-10-CM | POA: Diagnosis not present

## 2017-12-18 DIAGNOSIS — E1165 Type 2 diabetes mellitus with hyperglycemia: Secondary | ICD-10-CM | POA: Diagnosis not present

## 2017-12-18 DIAGNOSIS — E782 Mixed hyperlipidemia: Secondary | ICD-10-CM | POA: Diagnosis not present

## 2017-12-18 DIAGNOSIS — E6609 Other obesity due to excess calories: Secondary | ICD-10-CM | POA: Diagnosis not present

## 2017-12-23 DIAGNOSIS — E119 Type 2 diabetes mellitus without complications: Secondary | ICD-10-CM | POA: Diagnosis not present

## 2017-12-23 DIAGNOSIS — I10 Essential (primary) hypertension: Secondary | ICD-10-CM | POA: Diagnosis not present

## 2017-12-23 DIAGNOSIS — Z961 Presence of intraocular lens: Secondary | ICD-10-CM | POA: Diagnosis not present

## 2017-12-23 DIAGNOSIS — H26491 Other secondary cataract, right eye: Secondary | ICD-10-CM | POA: Diagnosis not present

## 2018-03-16 DIAGNOSIS — E6609 Other obesity due to excess calories: Secondary | ICD-10-CM | POA: Diagnosis not present

## 2018-03-16 DIAGNOSIS — G4733 Obstructive sleep apnea (adult) (pediatric): Secondary | ICD-10-CM | POA: Diagnosis not present

## 2018-03-16 DIAGNOSIS — E782 Mixed hyperlipidemia: Secondary | ICD-10-CM | POA: Diagnosis not present

## 2018-03-16 DIAGNOSIS — E1165 Type 2 diabetes mellitus with hyperglycemia: Secondary | ICD-10-CM | POA: Diagnosis not present

## 2018-07-08 DIAGNOSIS — Z9189 Other specified personal risk factors, not elsewhere classified: Secondary | ICD-10-CM | POA: Diagnosis not present

## 2018-07-08 DIAGNOSIS — I10 Essential (primary) hypertension: Secondary | ICD-10-CM | POA: Diagnosis not present

## 2018-07-08 DIAGNOSIS — E1165 Type 2 diabetes mellitus with hyperglycemia: Secondary | ICD-10-CM | POA: Diagnosis not present

## 2018-07-08 DIAGNOSIS — E782 Mixed hyperlipidemia: Secondary | ICD-10-CM | POA: Diagnosis not present

## 2018-07-15 DIAGNOSIS — I10 Essential (primary) hypertension: Secondary | ICD-10-CM | POA: Diagnosis not present

## 2018-07-15 DIAGNOSIS — E782 Mixed hyperlipidemia: Secondary | ICD-10-CM | POA: Diagnosis not present

## 2018-07-15 DIAGNOSIS — Z23 Encounter for immunization: Secondary | ICD-10-CM | POA: Diagnosis not present

## 2018-07-15 DIAGNOSIS — J301 Allergic rhinitis due to pollen: Secondary | ICD-10-CM | POA: Diagnosis not present

## 2018-07-15 DIAGNOSIS — E1165 Type 2 diabetes mellitus with hyperglycemia: Secondary | ICD-10-CM | POA: Diagnosis not present

## 2018-07-15 DIAGNOSIS — I481 Persistent atrial fibrillation: Secondary | ICD-10-CM | POA: Diagnosis not present

## 2018-07-17 ENCOUNTER — Telehealth: Payer: Self-pay | Admitting: *Deleted

## 2018-07-17 NOTE — Telephone Encounter (Signed)
REFERRAL SENT TO SCHEDULING.  °

## 2018-08-07 ENCOUNTER — Telehealth: Payer: Self-pay

## 2018-08-07 NOTE — Telephone Encounter (Signed)
Notes on file.  

## 2018-08-14 ENCOUNTER — Encounter: Payer: Self-pay | Admitting: Internal Medicine

## 2018-08-17 ENCOUNTER — Encounter: Payer: Self-pay | Admitting: Internal Medicine

## 2018-08-17 ENCOUNTER — Ambulatory Visit: Payer: BLUE CROSS/BLUE SHIELD | Admitting: Internal Medicine

## 2018-08-17 VITALS — BP 112/78 | HR 81 | Ht 75.0 in | Wt 252.2 lb

## 2018-08-17 DIAGNOSIS — G4730 Sleep apnea, unspecified: Secondary | ICD-10-CM

## 2018-08-17 DIAGNOSIS — I480 Paroxysmal atrial fibrillation: Secondary | ICD-10-CM

## 2018-08-17 DIAGNOSIS — I10 Essential (primary) hypertension: Secondary | ICD-10-CM

## 2018-08-17 NOTE — Patient Instructions (Addendum)
Medication Instructions:  Your physician recommends that you continue on your current medications as directed. Please refer to the Current Medication list given to you today.  Labwork: None ordered.  Testing/Procedures: Your physician has requested that you have an echocardiogram. Echocardiography is a painless test that uses sound waves to create images of your heart. It provides your doctor with information about the size and shape of your heart and how well your heart's chambers and valves are working. This procedure takes approximately one hour. There are no restrictions for this procedure.  Please schedule for ECHO in [place].  Follow-Up: Your physician wants you to follow-up in: 3 months with Rudi Coco NP at the afib clinic.  Any Other Special Instructions Will Be Listed Below (If Applicable).  If you need a refill on your cardiac medications before your next appointment, please call your pharmacy.

## 2018-08-17 NOTE — Progress Notes (Signed)
Electrophysiology Office Note   Date:  08/17/2018   ID:  Charles [name], DOB 25-Apr-1966, MRN [id number]  PCP:  Dr Donzetta Sprung   Primary Electrophysiologist: Hillis Range, MD    CC: afib   History of Present Illness: Charles [name] is a 52 y.o. male who presents today for electrophysiology evaluation.   He is referred by Dr Reuel Boom for afib.  I saw him previously and he underwent AF ablation by me 10/01/2011.  He has not been compliant with follow-up and has not seen me since 2013.   He feels that he has been in sinus rhythm since ablation.  07/15/18 he was evaluated by his PCP and noted to have recurrence of afib with RVR.  He was started on metoprolol and xarelto and scheduled to follow-up with me here today.  He feels that he is maintaining sinus rhythm since that time.  Today, he denies symptoms of palpitations, chest pain, shortness of breath, orthopnea, PND, lower extremity edema, claudication, dizziness, presyncope, syncope, bleeding, or neurologic sequela. The patient is tolerating medications without difficulties and is otherwise without complaint today.    Past Medical History:  Diagnosis Date  . Atrial fibrillation (HCC)   . Chronic systolic dysfunction of left ventricle   . Diabetes mellitus    A1c of 8% is an improvement from the previous level of 11%  . Hyperlipidemia   . Hypertension   . LVH (left ventricular hypertrophy)    Echo 08/3012-mild LVH  . Nonischemic cardiomyopathy (HCC)    39% - echo on 07/20/2011 -- EF of 45-50% and grade  2 diastolic dysfunction.  . Obesity   . Obstructive sleep apnea    noncompliant with CPAP  . Peripheral vascular disease [facility])    Past Surgical History:  Procedure Laterality Date  . CARDIAC CATHETERIZATION  10/15/04   no sign. CAD  . CARDIOVERSION  08/09/11   successful  . RADIOFREQUENCY ABLATION  10/13     Current Outpatient Medications  Medication Sig Dispense Refill  . amLODipine (NORVASC) 10 MG tablet Take 1 tablet (10 mg  total) by mouth daily. <please make appointment> 30 tablet 1  . FARXIGA 10 MG TABS tablet Take 1 tablet by mouth daily.  1  . glipiZIDE (GLUCOTROL) 5 MG tablet Take 5 mg by mouth 2 (two) times daily before a meal.     . metFORMIN (GLUCOPHAGE-XR) 500 MG 24 hr tablet Take 500 mg by mouth daily with breakfast.      . metoprolol succinate (TOPROL-XL) 50 MG 24 hr tablet Take 50 mg by mouth daily.  3  . multivitamin (THERAGRAN) per tablet Take 1 tablet by mouth daily.      . TRULICITY 1.5 MG/0.5ML SOPN Inject 0.5 mLs into the skin once a week.  6  . XARELTO 20 MG TABS tablet Take 1 tablet by mouth daily.  6   No current facility-administered medications for this visit.     Allergies:   Procaine hcl   Social History:  The patient  reports that he has never smoked. He has never used smokeless tobacco. He reports that he drinks alcohol. He reports that he does not use drugs.   Family History:  The patient's family history includes Alcohol abuse in his father; CAD in his father; Coronary artery disease in his sister and unknown relative; Diabetes in his brother, mother, and sister; Hypertension in his brother and mother.    ROS:  Please see the history of present illness.  All other systems are personally reviewed and negative.    PHYSICAL EXAM: VS:  BP 112/78   Pulse 81   Ht 6\' 3"  (1.905 m)   Wt 252 lb 3.2 oz (114.4 kg)   SpO2 94%   BMI 31.52 kg/m  , BMI Body mass index is 31.52 kg/m. GEN: Well nourished, well developed, in no acute distress  HEENT: normal  Neck: no JVD, carotid bruits, or masses Cardiac: RRR; no murmurs, rubs, or gallops,no edema  Respiratory:  clear to auscultation bilaterally, normal work of breathing GI: soft, nontender, nondistended, + BS MS: no deformity or atrophy  Skin: warm and dry  Neuro:  Strength and sensation are intact Psych: euthymic mood, full affect  EKG:  EKG is ordered today. The ekg ordered today is personally reviewed and shows sinus rhythm 81  bpm, PR 210 msec, QRS 86 msec, Qtc 418 msec, nonspecific St/T changes   Wt Readings from Last 3 Encounters:  08/17/18 252 lb 3.2 oz (114.4 kg)  03/05/17 269 lb 6.4 oz (122.2 kg)  08/07/12 287 lb 6.4 oz (130.4 kg)     Other studies personally reviewed: Additional studies/ records that were reviewed today include: echo 2013,    Review of the above records today demonstrates: records from Dr Rosann Auerbach office   ASSESSMENT AND PLAN:  1.  paroxysmal atrial fibrillation chad2vasc score is at least 2.  He is on xarelto but worries about costs.  We will give card today for reduced copays. Will obtain echo Continue current medical therapy Lifestyle modification encouraged  2. OSA Not compliant with CPAP  I have advised that he use it.  May need to consider repeat sleep study (he is reluctant)  3. HTN Stable No change required today Update echo  4. Overweight Body mass index is 31.52 kg/m. Wt Readings from Last 3 Encounters:  08/17/18 252 lb 3.2 oz (114.4 kg)  03/05/17 269 lb 6.4 oz (122.2 kg)  08/07/12 287 lb 6.4 oz (130.4 kg)   He has made some progress with lifestyle change.  This is encouraged today  Follow-up:  AF clinic in 3 months  Current medicines are reviewed at length with the patient today.   The patient does not have concerns regarding his medicines.  The following changes were made today:  none   Signed, Hillis Range, MD  08/17/2018 3:19 PM     [facility] [address] [place] [address] [phone] (office) [phone] (fax)

## 2018-08-27 ENCOUNTER — Ambulatory Visit (INDEPENDENT_AMBULATORY_CARE_PROVIDER_SITE_OTHER): Payer: BLUE CROSS/BLUE SHIELD

## 2018-08-27 ENCOUNTER — Other Ambulatory Visit: Payer: Self-pay

## 2018-08-27 DIAGNOSIS — I480 Paroxysmal atrial fibrillation: Secondary | ICD-10-CM

## 2018-10-06 DIAGNOSIS — I10 Essential (primary) hypertension: Secondary | ICD-10-CM | POA: Diagnosis not present

## 2018-10-06 DIAGNOSIS — E782 Mixed hyperlipidemia: Secondary | ICD-10-CM | POA: Diagnosis not present

## 2018-10-06 DIAGNOSIS — E1165 Type 2 diabetes mellitus with hyperglycemia: Secondary | ICD-10-CM | POA: Diagnosis not present

## 2018-11-18 ENCOUNTER — Ambulatory Visit (HOSPITAL_COMMUNITY): Payer: BLUE CROSS/BLUE SHIELD | Admitting: Nurse Practitioner

## 2019-01-07 DIAGNOSIS — Z6833 Body mass index (BMI) 33.0-33.9, adult: Secondary | ICD-10-CM | POA: Diagnosis not present

## 2019-01-07 DIAGNOSIS — J0100 Acute maxillary sinusitis, unspecified: Secondary | ICD-10-CM | POA: Diagnosis not present

## 2019-01-14 DIAGNOSIS — E782 Mixed hyperlipidemia: Secondary | ICD-10-CM | POA: Diagnosis not present

## 2019-01-14 DIAGNOSIS — E6609 Other obesity due to excess calories: Secondary | ICD-10-CM | POA: Diagnosis not present

## 2019-01-14 DIAGNOSIS — I10 Essential (primary) hypertension: Secondary | ICD-10-CM | POA: Diagnosis not present

## 2019-01-14 DIAGNOSIS — E1165 Type 2 diabetes mellitus with hyperglycemia: Secondary | ICD-10-CM | POA: Diagnosis not present

## 2019-01-14 DIAGNOSIS — K429 Umbilical hernia without obstruction or gangrene: Secondary | ICD-10-CM | POA: Diagnosis not present

## 2019-01-14 DIAGNOSIS — G4733 Obstructive sleep apnea (adult) (pediatric): Secondary | ICD-10-CM | POA: Diagnosis not present

## 2019-01-14 DIAGNOSIS — Z1331 Encounter for screening for depression: Secondary | ICD-10-CM | POA: Diagnosis not present

## 2019-02-17 DIAGNOSIS — K429 Umbilical hernia without obstruction or gangrene: Secondary | ICD-10-CM | POA: Insufficient documentation

## 2019-02-17 DIAGNOSIS — Z6832 Body mass index (BMI) 32.0-32.9, adult: Secondary | ICD-10-CM | POA: Diagnosis not present

## 2019-02-24 DIAGNOSIS — K429 Umbilical hernia without obstruction or gangrene: Secondary | ICD-10-CM | POA: Diagnosis not present

## 2019-02-24 DIAGNOSIS — I4891 Unspecified atrial fibrillation: Secondary | ICD-10-CM | POA: Diagnosis not present

## 2019-02-24 DIAGNOSIS — E119 Type 2 diabetes mellitus without complications: Secondary | ICD-10-CM | POA: Diagnosis not present

## 2019-02-24 DIAGNOSIS — Z6832 Body mass index (BMI) 32.0-32.9, adult: Secondary | ICD-10-CM | POA: Diagnosis not present

## 2019-02-24 DIAGNOSIS — E669 Obesity, unspecified: Secondary | ICD-10-CM | POA: Diagnosis not present

## 2019-02-24 DIAGNOSIS — G4733 Obstructive sleep apnea (adult) (pediatric): Secondary | ICD-10-CM | POA: Diagnosis not present

## 2019-02-24 DIAGNOSIS — I10 Essential (primary) hypertension: Secondary | ICD-10-CM | POA: Diagnosis not present

## 2019-02-25 DIAGNOSIS — E669 Obesity, unspecified: Secondary | ICD-10-CM | POA: Diagnosis not present

## 2019-02-25 DIAGNOSIS — I10 Essential (primary) hypertension: Secondary | ICD-10-CM | POA: Diagnosis not present

## 2019-02-25 DIAGNOSIS — I4891 Unspecified atrial fibrillation: Secondary | ICD-10-CM | POA: Diagnosis not present

## 2019-02-25 DIAGNOSIS — G4733 Obstructive sleep apnea (adult) (pediatric): Secondary | ICD-10-CM | POA: Diagnosis not present

## 2019-02-25 DIAGNOSIS — Z6832 Body mass index (BMI) 32.0-32.9, adult: Secondary | ICD-10-CM | POA: Diagnosis not present

## 2019-02-25 DIAGNOSIS — K429 Umbilical hernia without obstruction or gangrene: Secondary | ICD-10-CM | POA: Diagnosis not present

## 2019-02-25 DIAGNOSIS — E119 Type 2 diabetes mellitus without complications: Secondary | ICD-10-CM | POA: Diagnosis not present

## 2019-05-05 DIAGNOSIS — G4733 Obstructive sleep apnea (adult) (pediatric): Secondary | ICD-10-CM | POA: Diagnosis not present

## 2019-05-05 DIAGNOSIS — I10 Essential (primary) hypertension: Secondary | ICD-10-CM | POA: Diagnosis not present

## 2019-05-05 DIAGNOSIS — E1165 Type 2 diabetes mellitus with hyperglycemia: Secondary | ICD-10-CM | POA: Diagnosis not present

## 2019-05-05 DIAGNOSIS — E782 Mixed hyperlipidemia: Secondary | ICD-10-CM | POA: Diagnosis not present

## 2019-05-12 DIAGNOSIS — E782 Mixed hyperlipidemia: Secondary | ICD-10-CM | POA: Diagnosis not present

## 2019-05-12 DIAGNOSIS — G4733 Obstructive sleep apnea (adult) (pediatric): Secondary | ICD-10-CM | POA: Diagnosis not present

## 2019-05-12 DIAGNOSIS — I10 Essential (primary) hypertension: Secondary | ICD-10-CM | POA: Diagnosis not present

## 2019-05-12 DIAGNOSIS — E1165 Type 2 diabetes mellitus with hyperglycemia: Secondary | ICD-10-CM | POA: Diagnosis not present

## 2019-06-14 ENCOUNTER — Other Ambulatory Visit: Payer: Self-pay

## 2019-06-14 ENCOUNTER — Encounter: Payer: Self-pay | Admitting: Emergency Medicine

## 2019-06-14 ENCOUNTER — Emergency Department
Admission: EM | Admit: 2019-06-14 | Discharge: 2019-06-14 | Disposition: A | Discharge status: Home / Self Care | Payer: BC Managed Care – PPO | Attending: Emergency Medicine | Admitting: Emergency Medicine

## 2019-06-14 ENCOUNTER — Emergency Department: Payer: BC Managed Care – PPO

## 2019-06-14 DIAGNOSIS — I480 Paroxysmal atrial fibrillation: Secondary | ICD-10-CM

## 2019-06-14 DIAGNOSIS — Z7984 Long term (current) use of oral hypoglycemic drugs: Secondary | ICD-10-CM | POA: Diagnosis not present

## 2019-06-14 DIAGNOSIS — I5022 Chronic systolic (congestive) heart failure: Secondary | ICD-10-CM | POA: Diagnosis not present

## 2019-06-14 DIAGNOSIS — R0789 Other chest pain: Secondary | ICD-10-CM | POA: Diagnosis not present

## 2019-06-14 DIAGNOSIS — Z79899 Other long term (current) drug therapy: Secondary | ICD-10-CM | POA: Diagnosis not present

## 2019-06-14 DIAGNOSIS — R0602 Shortness of breath: Secondary | ICD-10-CM | POA: Diagnosis not present

## 2019-06-14 DIAGNOSIS — E119 Type 2 diabetes mellitus without complications: Secondary | ICD-10-CM | POA: Insufficient documentation

## 2019-06-14 DIAGNOSIS — Z20828 Contact with and (suspected) exposure to other viral communicable diseases: Secondary | ICD-10-CM | POA: Insufficient documentation

## 2019-06-14 DIAGNOSIS — I110 Hypertensive heart disease with heart failure: Secondary | ICD-10-CM | POA: Diagnosis not present

## 2019-06-14 LAB — CBC
HCT: 46.1 % (ref 39.0–52.0)
Hemoglobin: 15.6 g/dL (ref 13.0–17.0)
MCH: 30.4 pg (ref 26.0–34.0)
MCHC: 33.8 g/dL (ref 30.0–36.0)
MCV: 89.9 fL (ref 80.0–100.0)
Platelets: 191 10*3/uL (ref 150–400)
RBC: 5.13 MIL/uL (ref 4.22–5.81)
RDW: 13.1 % (ref 11.5–15.5)
WBC: 6.9 10*3/uL (ref 4.0–10.5)
nRBC: 0 % (ref 0.0–0.2)

## 2019-06-14 LAB — BASIC METABOLIC PANEL
Anion gap: 10 (ref 5–15)
BUN: 16 mg/dL (ref 6–20)
CO2: 23 mmol/L (ref 22–32)
Calcium: 9.1 mg/dL (ref 8.9–10.3)
Chloride: 103 mmol/L (ref 98–111)
Creatinine, Ser: 0.78 mg/dL (ref 0.61–1.24)
GFR calc Af Amer: >60 mL/min (ref >60)
GFR calc non Af Amer: >60 mL/min (ref >60)
Glucose, Bld: 210 mg/dL — ABNORMAL HIGH (ref 70–99)
Potassium: 3.6 mmol/L (ref 3.5–5.1)
Sodium: 136 mmol/L (ref 135–145)

## 2019-06-14 LAB — BRAIN NATRIURETIC PEPTIDE: B Natriuretic Peptide: 604 pg/mL — ABNORMAL HIGH (ref 0.0–100.0)

## 2019-06-14 LAB — TROPONIN I (HIGH SENSITIVITY)
Troponin I (High Sensitivity): 11 ng/L (ref <18)
Troponin I (High Sensitivity): 10 ng/L (ref <18)

## 2019-06-14 LAB — MAGNESIUM: Magnesium: 1.8 mg/dL (ref 1.7–2.4)

## 2019-06-14 LAB — TSH: TSH: 2.792 u[IU]/mL (ref 0.350–4.500)

## 2019-06-14 LAB — SARS CORONAVIRUS 2 BY RT PCR (HOSPITAL ORDER, PERFORMED IN ~~LOC~~ HOSPITAL LAB): SARS Coronavirus 2: NEGATIVE

## 2019-06-14 MED ORDER — METOPROLOL TARTRATE 5 MG/5ML IV SOLN
5.0000 mg | Freq: Once | INTRAVENOUS | Status: AC
  Administered 2019-06-14: 5 mg via INTRAVENOUS
  Filled 2019-06-14: qty 5

## 2019-06-14 MED ORDER — METOPROLOL TARTRATE 50 MG PO TABS
50.0000 mg | ORAL_TABLET | Freq: Once | ORAL | Status: AC
  Administered 2019-06-14: 50 mg via ORAL
  Filled 2019-06-14: qty 1

## 2019-06-14 MED ORDER — METOPROLOL SUCCINATE ER 50 MG PO TB24: 50.0000 mg | ORAL_TABLET | Freq: Two times a day (BID) | ORAL | 3 refills | Status: DC

## 2019-06-14 MED ORDER — SODIUM CHLORIDE 0.9% FLUSH
3.0000 mL | Freq: Once | INTRAVENOUS | Status: AC
  Administered 2019-06-14: 12:00:00 3 mL via INTRAVENOUS

## 2019-06-14 NOTE — Discharge Instructions (Signed)
Your lab work was reassuring.  Your cardiac markers were negative for heart attack.  Your chest x-ray was negative for fluid.  Your symptoms got better as we control your heart rate.  I talked to cardiology and they recommended increasing your metoprolol to 2 times daily.  He should follow-up with them outpatient.

## 2019-06-14 NOTE — ED Triage Notes (Signed)
Pt reports last pm started with some tightness in his chest and some SOB.

## 2019-06-14 NOTE — ED Provider Notes (Signed)
[facility] Emergency Department Provider Note  ____________________________________________   First MD Initiated Contact with Patient 06/14/19 1100     (approximate)  I have reviewed the triage vital signs and the nursing notes.   HISTORY  Chief Complaint Shortness of Breath and Chest Pain    HPI Charles [name] is a 53 y.o. male with atrial fibrillation, diabetes, hypertension, hyperlipidemia, nonischemic cardiomyopathy who presents for chest pain or shortness of breath.     Patient woke up at 5 AM with some diaphoresis and chest discomfort and headache.  Patient took some Excedrin with back to bed and woke up and felt better.  Patient this morning called his primary care doctor hoping that he could be seen and was told to come to the emergency room for further evaluation.  Patient says he had a history of atrial fibrillation that he had ablated.  He thinks he goes into paroxysmal A. fib.  Patient is on metoprolol 50 as well as Xarelto.  Patient denies hitting his head.  His headache is now resolved.  Patient still feels a little discomfort in his chest that is mild, constant, nothing makes it better or worse.  He denies any alcohol use or smoking history.    Past Medical History:  Diagnosis Date  . Atrial fibrillation ([place])   . Chronic systolic dysfunction of left ventricle   . Diabetes mellitus    A1c of 8% is an improvement from the previous level of 11%  . Hyperlipidemia   . Hypertension   . LVH (left ventricular hypertrophy)    Echo 08/3012-mild LVH  . Nonischemic cardiomyopathy ([place])    39% - echo on 07/20/2011 -- EF of 45-50% and grade  2 diastolic dysfunction.  . Obesity   . Obstructive sleep apnea    noncompliant with CPAP  . Peripheral vascular disease [facility])     Patient Active Problem List   Diagnosis Date Noted  . Pre-operative clearance 03/05/2017  . Hypertension 01/06/2012  . PAF (paroxysmal atrial fibrillation) ([place]) 09/11/2011   . Sleep apnea 09/11/2011  . Chronic systolic dysfunction of left ventricle 09/11/2011    Past Surgical History:  Procedure Laterality Date  . CARDIAC CATHETERIZATION  10/15/04   no sign. CAD  . CARDIOVERSION  08/09/11   successful  . RADIOFREQUENCY ABLATION  10/01/2011   PVI for afib by Dr Rayann Heman    Prior to Admission medications   Medication Sig Start Date End Date Taking? Authorizing Provider  amLODipine (NORVASC) 10 MG tablet Take 1 tablet (10 mg total) by mouth daily. <please make appointment> 02/15/14   Croitoru, Mihai, MD  FARXIGA 10 MG TABS tablet Take 1 tablet by mouth daily. 02/17/17   [provider]  glipiZIDE (GLUCOTROL) 5 MG tablet Take 5 mg by mouth 2 (two) times daily before a meal.  09/04/11   [provider]  metFORMIN (GLUCOPHAGE-XR) 500 MG 24 hr tablet Take 500 mg by mouth daily with breakfast.      [provider]  metoprolol succinate (TOPROL-XL) 50 MG 24 hr tablet Take 50 mg by mouth daily. 07/15/18   [provider]  multivitamin [facility]) per tablet Take 1 tablet by mouth daily.      [provider]  TRULICITY 1.5 EL/3.8BO SOPN Inject 0.5 mLs into the skin once a week. 07/26/18   [provider]  XARELTO 20 MG TABS tablet Take 1 tablet by mouth daily. 07/15/18   [provider]    Allergies Procaine  hcl  Family History  Problem Relation Age of Onset  . Hypertension Mother   . Diabetes Mother   . Alcohol abuse Father   . CAD Father   . Coronary artery disease Sister        with multiple stents  . Coronary artery disease Other        in multiple family members  . Hypertension Brother   . Diabetes Sister   . Diabetes Brother     Social History Social History   Tobacco Use  . Smoking status: Never Smoker  . Smokeless tobacco: Never Used  Substance Use Topics  . Alcohol use: Yes    Comment: seldom  . Drug use: No      Review of Systems Constitutional: No fever/chills Eyes: No visual  changes. ENT: No sore throat. Cardiovascular: positive chest pain  Respiratory: positive shortness of breath. Gastrointestinal: No abdominal pain.  No nausea, no vomiting.  No diarrhea.  No constipation. Genitourinary: Negative for dysuria. Musculoskeletal: Negative for back pain. Skin: Negative for rash. Neurological: Negative for headaches, focal weakness or numbness. All other ROS negative ____________________________________________   PHYSICAL EXAM:  VITAL SIGNS: ED Triage Vitals  Enc Vitals Group     BP 06/14/19 1058 (!) 144/102     Pulse Rate 06/14/19 1058 (!) 133     Resp 06/14/19 1058 (!) 21     Temp 06/14/19 1058 98.4 F (36.9 C)     Temp Source 06/14/19 1058 Oral     SpO2 06/14/19 1058 96 %     Weight 06/14/19 1056 250 lb (113.4 kg)     Height 06/14/19 1056 6\' 3"  (1.905 m)     Head Circumference --      Peak Flow --      Pain Score 06/14/19 1056 5     Pain Loc --      Pain Edu? --      Excl. in GC? --     Constitutional: Alert and oriented. Well appearing and in no acute distress. Eyes: Conjunctivae are normal. EOMI. Head: Atraumatic. Nose: No congestion/rhinnorhea. Mouth/Throat: Mucous membranes are moist.  Oropharynx non-erythematous. Neck: No stridor. Trachea Midline. FROM Cardiovascular: Tachycardic, irregular grossly normal heart sounds.  Good peripheral circulation. Respiratory: Normal respiratory effort.  No retractions. Lungs CTAB. Gastrointestinal: Soft and nontender. No distention. No abdominal bruits.  Musculoskeletal: No lower extremity tenderness nor edema.  No joint effusions. Neurologic:  Normal speech and language. No gross focal neurologic deficits are appreciated.  Skin:  Skin is warm, dry and intact. No rash noted. Psychiatric: Mood and affect are normal. Speech and behavior are normal. GU: Deferred   ____________________________________________   LABS (all labs ordered are listed, but only abnormal results are displayed)  Labs  Reviewed  BASIC METABOLIC PANEL - Abnormal; Notable for the following components:      Result Value   Glucose, Bld 210 (*)    All other components within normal limits  BRAIN NATRIURETIC PEPTIDE - Abnormal; Notable for the following components:   B Natriuretic Peptide 604.0 (*)    All other components within normal limits  SARS CORONAVIRUS 2 (HOSPITAL ORDER, PERFORMED IN [facility])  CBC  TROPONIN I (HIGH SENSITIVITY)  TROPONIN I (HIGH SENSITIVITY)  TSH  MAGNESIUM   ____________________________________________   ED ECG REPORT I, Concha SeMary E Caden Fukushima, the attending physician, personally viewed and interpreted this ECG.  EKG A. fib with RVR rate of 133, occasional PVC, T wave inversion in the inferior leads  that is similar to prior EKG, no ST elevation, normal intervals ____________________________________________  RADIOLOGY Vela ProseI, Bronco Mcgrory E Kaegan Stigler, personally viewed and evaluated these images (plain radiographs) as part of my medical decision making, as well as reviewing the written report by the radiologist.  ED MD interpretation: Chest x-ray negative  Official radiology report(s): Dg Chest 1 View  Result Date: 06/14/2019 CLINICAL DATA:  Shortness of breath EXAM: CHEST  1 VIEW COMPARISON:  08/07/2011 FINDINGS: The heart size and mediastinal contours are within normal limits. Both lungs are clear. The visualized skeletal structures are unremarkable. IMPRESSION: No active disease. Electronically Signed   By: Elige KoHetal  [name]   On: 06/14/2019 11:38    ____________________________________________   PROCEDURES  Procedure(s) performed (including Critical Care):  .Critical Care Performed by: Concha SeFunke, Latravis Grine E, MD Authorized by: Concha SeFunke, Carrolyn Hilmes E, MD   Critical care provider statement:    Critical care time (minutes):  30   Critical care was necessary to treat or prevent imminent or life-threatening deterioration of the following conditions: A. fib with a heart rate greater than 150 and  chest pain and shortness of breath requiring IV medication.   Critical care was time spent personally by me on the following activities:  Discussions with consultants, evaluation of patient's response to treatment, examination of patient, ordering and performing treatments and interventions, ordering and review of laboratory studies, ordering and review of radiographic studies, pulse oximetry, re-evaluation of patient's condition, obtaining history from patient or surrogate and review of old charts     ____________________________________________   INITIAL IMPRESSION / ASSESSMENT AND PLAN / ED COURSE     Denman GeorgeWilliam E Constantino was evaluated in Emergency Department on 06/14/2019 for the symptoms described in the history of present illness. He was evaluated in the context of the global COVID-19 pandemic, which necessitated consideration that the patient might be at risk for infection with the SARS-CoV-2 virus that causes COVID-19. Institutional protocols and algorithms that pertain to the evaluation of patients at risk for COVID-19 are in a state of rapid change based on information released by regulatory bodies including the CDC and federal and state organizations. These policies and algorithms were followed during the patient's care in the ED.    Patient with A. fib with RVR in the setting of some chest discomfort.  Patient's last EKG was sinus.  Patient has had prior ablation.  Patient thinks that he intermittently flips into A. fib.  Will give 5 mg of IV metoprolol due to patient's rates.  Patient symptoms is most likely secondary to his A. fib however will get cardiac markers to rule out ACS.  Will also get x-ray to evaluate for evidence of decompensation from his A. fib such as pulmonary edema.  Also check electrolytes to evaluate for electrolyte abnormalities.  Low suspicion for PE given patient is already on a blood thinner and denies missing any doses.  Low suspicion for dissection given no pain  radiating to the back.   Clinical Course as of Jun 14 1603  Mon Jun 14, 2019  1245 Notable for an elevated troponin of 11.  proBNP elevation 604.   [MF]  1245 Chest x-ray reviewed and negative.   [MF]  1417 Reevaluated patient.  Repeat troponin is stable.  Patient symptoms have resolved leaving heart rates better controlled.  Heart rates range between 100 and 120.  Discussed with patient increasing his metoprolol to 50 twice daily.  Patient will follow-up with cardiology.  Patient understands return precautions including worsening shortness of breath.  At this time he is tolerating his A. fib well.    [MF]    Clinical Course User Index [MF] Concha Se, MD   Will discuss with cardiology for admission for observation versus close cardiology follow-up given patient is stable with normal reassuring vital signs and heart rates that are better controlled and symptoms are now resolved.  1:28 PM discussed with Dr. Kirke Corin cardiology.  Given heart rates are improving and patient asymptomatic okay with d/c home and follow up.  They want to increase patient's metoprolol 50 XL from once daily to twice daily.  They will follow-up closely outpatient.    ____________________________________________   FINAL CLINICAL IMPRESSION(S) / ED DIAGNOSES   Final diagnoses:  Paroxysmal atrial fibrillation (HCC)      MEDICATIONS GIVEN DURING THIS VISIT:  Medications  sodium chloride flush (NS) 0.9 % injection 3 mL (3 mLs Intravenous Given 06/14/19 1139)  metoprolol tartrate (LOPRESSOR) injection 5 mg (5 mg Intravenous Given 06/14/19 1130)  metoprolol tartrate (LOPRESSOR) tablet 50 mg (50 mg Oral Given 06/14/19 1336)     ED Discharge Orders         Ordered    metoprolol succinate (TOPROL-XL) 50 MG 24 hr tablet  2 times daily     06/14/19 1418           Note:  This document was prepared using Dragon voice recognition software and may include unintentional dictation errors.   Concha Se, MD  06/14/19 [phone]

## 2019-06-17 ENCOUNTER — Encounter (HOSPITAL_COMMUNITY): Payer: Self-pay | Admitting: Physician Assistant

## 2019-06-17 ENCOUNTER — Other Ambulatory Visit: Payer: Self-pay

## 2019-06-17 ENCOUNTER — Ambulatory Visit (HOSPITAL_COMMUNITY)
Admission: RE | Admit: 2019-06-17 | Discharge: 2019-06-17 | Disposition: A | Discharge status: Home / Self Care | Payer: BC Managed Care – PPO | Source: Ambulatory Visit | Attending: Physician Assistant | Admitting: Physician Assistant

## 2019-06-17 VITALS — BP 132/84 | HR 135 | Ht 75.0 in | Wt 255.0 lb

## 2019-06-17 DIAGNOSIS — Z6831 Body mass index (BMI) 31.0-31.9, adult: Secondary | ICD-10-CM | POA: Diagnosis not present

## 2019-06-17 DIAGNOSIS — I10 Essential (primary) hypertension: Secondary | ICD-10-CM | POA: Diagnosis not present

## 2019-06-17 DIAGNOSIS — I4891 Unspecified atrial fibrillation: Secondary | ICD-10-CM | POA: Diagnosis present

## 2019-06-17 DIAGNOSIS — I428 Other cardiomyopathies: Secondary | ICD-10-CM | POA: Insufficient documentation

## 2019-06-17 DIAGNOSIS — E669 Obesity, unspecified: Secondary | ICD-10-CM | POA: Diagnosis not present

## 2019-06-17 DIAGNOSIS — E785 Hyperlipidemia, unspecified: Secondary | ICD-10-CM | POA: Insufficient documentation

## 2019-06-17 DIAGNOSIS — Z7984 Long term (current) use of oral hypoglycemic drugs: Secondary | ICD-10-CM | POA: Insufficient documentation

## 2019-06-17 DIAGNOSIS — Z79899 Other long term (current) drug therapy: Secondary | ICD-10-CM | POA: Insufficient documentation

## 2019-06-17 DIAGNOSIS — I480 Paroxysmal atrial fibrillation: Secondary | ICD-10-CM | POA: Diagnosis not present

## 2019-06-17 DIAGNOSIS — E119 Type 2 diabetes mellitus without complications: Secondary | ICD-10-CM | POA: Insufficient documentation

## 2019-06-17 DIAGNOSIS — Z833 Family history of diabetes mellitus: Secondary | ICD-10-CM | POA: Diagnosis not present

## 2019-06-17 DIAGNOSIS — Z884 Allergy status to anesthetic agent status: Secondary | ICD-10-CM | POA: Insufficient documentation

## 2019-06-17 DIAGNOSIS — Z8249 Family history of ischemic heart disease and other diseases of the circulatory system: Secondary | ICD-10-CM | POA: Diagnosis not present

## 2019-06-17 DIAGNOSIS — Z7901 Long term (current) use of anticoagulants: Secondary | ICD-10-CM | POA: Diagnosis not present

## 2019-06-17 DIAGNOSIS — G4733 Obstructive sleep apnea (adult) (pediatric): Secondary | ICD-10-CM | POA: Insufficient documentation

## 2019-06-17 MED ORDER — DILTIAZEM HCL ER COATED BEADS 240 MG PO CP24: 240.0000 mg | ORAL_CAPSULE | Freq: Every day | ORAL | 3 refills | Status: DC

## 2019-06-17 NOTE — Patient Instructions (Signed)
Stop amlodipine  Start cardizem 240mg  once a day

## 2019-06-17 NOTE — Progress Notes (Signed)
Primary Care Physician: System, Provider Not In Primary Cardiologist: none Primary Electrophysiologist: Dr Johney FrameAllred Referring Physician: [facility]   Charles [name] is a 53 y.o. male with a history of DM, nonischemic CM, OSA, PVD, HTN, HLD, and paroxysmal atrial fibrillation who [facility]. Patient is s/p afib ablation with Dr Johney FrameAllred in 2012 and had not followed up until 08/2018. Patient went to the ER on 06/14/19 with symptoms of SOB, diaphoresis and chest discomfort and was found to be in afib with RVR. He was discharged in rate controlled afib. Patient reports that since then, he feels like he went back into SR for a few days with normal, regular heart rate and no symptoms. This morning, patient has symptoms again of chest discomfort and fatigue. He is in afib today with RVR.   Today, he denies symptoms of palpitations, shortness of breath, orthopnea, PND, lower extremity edema, dizziness, presyncope, syncope, snoring, daytime somnolence, bleeding, or neurologic sequela. The patient is tolerating medications without difficulties and is otherwise without complaint today.    Atrial Fibrillation Risk Factors:  he does have symptoms or diagnosis of sleep apnea. he is not compliant with CPAP therapy. he does not have a history of rheumatic fever. he does not have a history of alcohol use. The patient does not have a history of early familial atrial fibrillation or other arrhythmias.  he has a BMI of Body mass index is 31.87 kg/m.Marland Kitchen. Filed Weights   06/17/19 1502  Weight: 115.7 kg    Family History  Problem Relation Age of Onset  . Hypertension Mother   . Diabetes Mother   . Alcohol abuse Father   . CAD Father   . Coronary artery disease Sister        with multiple stents  . Coronary artery disease Other        in multiple family members  . Hypertension Brother   . Diabetes Sister   . Diabetes Brother       Atrial Fibrillation Management history:  Previous antiarrhythmic drugs: sotalol  Previous cardioversions: 2012 Previous ablations: 2012 CHADS2VASC score: 2 Anticoagulation history: Pradaxa, Xarelto   Past Medical History:  Diagnosis Date  . Atrial fibrillation (HCC)   . Chronic systolic dysfunction of left ventricle   . Diabetes mellitus    A1c of 8% is an improvement from the previous level of 11%  . Hyperlipidemia   . Hypertension   . LVH (left ventricular hypertrophy)    Echo 08/3012-mild LVH  . Nonischemic cardiomyopathy (HCC)    39% - echo on 07/20/2011 -- EF of 45-50% and grade  2 diastolic dysfunction.  . Obesity   . Obstructive sleep apnea    noncompliant with CPAP  . Peripheral vascular disease [facility](HCC)    Past Surgical History:  Procedure Laterality Date  . CARDIAC CATHETERIZATION  10/15/04   no sign. CAD  . CARDIOVERSION  08/09/11   successful  . RADIOFREQUENCY ABLATION  10/01/2011   PVI for afib by Dr Johney FrameAllred    Current Outpatient Medications  Medication Sig Dispense Refill  . FARXIGA 10 MG TABS tablet Take 1 tablet by mouth daily.  1  . glipiZIDE (GLUCOTROL) 5 MG tablet Take 5 mg by mouth 2 (two) times daily before a meal.     . metFORMIN (GLUCOPHAGE-XR) 500 MG 24 hr tablet Take 500 mg by mouth daily with breakfast.      . metoprolol succinate (TOPROL-XL) 50 MG  24 hr tablet Take 1 tablet (50 mg total) by mouth 2 (two) times a day. 30 tablet 3  . TRULICITY 1.5 MG/0.5ML SOPN Inject 0.5 mLs into the skin once a week.  6  . XARELTO 20 MG TABS tablet Take 1 tablet by mouth daily.  6  . diltiazem (CARDIZEM CD) 240 MG 24 hr capsule Take 1 capsule (240 mg total) by mouth daily. 30 capsule 3   No current facility-administered medications for this encounter.     Allergies  Allergen Reactions  . Procaine Hcl Nausea And Vomiting    Social History   Socioeconomic History  . Marital status: Divorced    Spouse name: Not on file  . Number of children: Not on file  .  Years of education: Not on file  . Highest education level: Not on file  Occupational History  . Not on file  Social Needs  . Financial resource strain: Not on file  . Food insecurity    Worry: Not on file    Inability: Not on file  . Transportation needs    Medical: Not on file    Non-medical: Not on file  Tobacco Use  . Smoking status: Never Smoker  . Smokeless tobacco: Never Used  Substance and Sexual Activity  . Alcohol use: Yes    Comment: seldom  . Drug use: No  . Sexual activity: Not on file  Lifestyle  . Physical activity    Days per week: Not on file    Minutes per session: Not on file  . Stress: Not on file  Relationships  . Social Musician on phone: Not on file    Gets together: Not on file    Attends religious service: Not on file    Active member of club or organization: Not on file    Attends meetings of clubs or organizations: Not on file    Relationship status: Not on file  . Intimate partner violence    Fear of current or ex partner: Not on file    Emotionally abused: Not on file    Physically abused: Not on file    Forced sexual activity: Not on file  Other Topics Concern  . Not on file  Social History Narrative   Lives in [place] 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,  Textile worker           ROS- All systems are reviewed and negative except as per the HPI above.  Physical Exam: Vitals:   06/17/19 1502  BP: 132/84  Pulse: (!) 135  Weight: 115.7 kg  Height: 6\' 3"  (1.905 m)    GEN- The patient is well appearing obese male, alert and oriented x 3 today.   Head- normocephalic, atraumatic Eyes-  Sclera clear, conjunctiva pink Ears- hearing intact Oropharynx- clear Neck- supple  Lungs- Clear to ausculation bilaterally, normal work of breathing Heart- irregular rate and rhythm, tachycardia, no murmurs, rubs or gallops  GI- soft, NT, ND, + BS Extremities- no clubbing, cyanosis, or edema MS- no significant deformity or atrophy Skin- no rash  or lesion Psych- euthymic mood, full affect Neuro- strength and sensation are intact  Wt Readings from Last 3 Encounters:  06/17/19 115.7 kg  06/14/19 113.4 kg  08/17/18 114.4 kg    EKG today demonstrates afib HR 135, QRS 86, QTc 492  Echo 08/27/18 demonstrated  - Left ventricle: The cavity size was normal. Wall thickness was   increased in a pattern of mild LVH. Systolic function  was normal.   The estimated ejection fraction was in the range of 55% to 60%.   Wall motion was normal; there were no regional wall motion   abnormalities. Left ventricular diastolic function parameters   were normal. - Aortic valve: There was mild regurgitation.  Epic records are reviewed at length today  Assessment and Plan:  1. Paroxysmal atrial fibrillation The patient has paroxysmal atrial fibrillation.   S/p ablation 2012 with Dr Rayann Heman. Patient appears to be having paroxysms of afib.  General education about afib discussed and questions answered.  Will change amlodipine to diltiazem 240 mg for rate control. Continue Toprol 50 mg BID Continue Xarelto 20 mg daily If patient is persistent at follow up, will consider DCCV. We also discussed possibility of AAD therapy vs repeat ablation today. Lifestyle changes as below.  This patients CHA2DS2-VASc Score and unadjusted Ischemic Stroke Rate (% per year) is equal to 2.2 % stroke rate/year from a score of 2  Above score calculated as 1 point each if present [CHF, HTN, DM, Vascular=MI/PAD/Aortic Plaque, Age if 65-74, or Male] Above score calculated as 2 points each if present [Age > 75, or Stroke/TIA/TE]   2. Obesity Body mass index is 31.87 kg/m. Lifestyle modification was discussed at length including regular exercise and weight reduction.  3. Obstructive sleep apnea The importance of adequate treatment of sleep apnea was discussed today in order to improve our ability to maintain sinus rhythm long term. Will arrange for sleep study so  patient can be restarted on CPAP therapy. Patient is in agreement with plan.  4. HTN Stable, med changes as above.   Follow up in AF clinic in one week.   [facility] [address] [place],  [address] [phone] 06/17/2019 3:43 PM

## 2019-06-24 ENCOUNTER — Ambulatory Visit (HOSPITAL_COMMUNITY)
Admission: RE | Admit: 2019-06-24 | Discharge: 2019-06-24 | Disposition: A | Discharge status: Home / Self Care | Payer: BC Managed Care – PPO | Source: Ambulatory Visit | Attending: Physician Assistant | Admitting: Physician Assistant

## 2019-06-24 ENCOUNTER — Encounter (HOSPITAL_COMMUNITY): Payer: Self-pay | Admitting: Physician Assistant

## 2019-06-24 ENCOUNTER — Other Ambulatory Visit: Payer: Self-pay

## 2019-06-24 VITALS — BP 134/74 | HR 97 | Ht 75.0 in | Wt 255.0 lb

## 2019-06-24 DIAGNOSIS — G4733 Obstructive sleep apnea (adult) (pediatric): Secondary | ICD-10-CM | POA: Insufficient documentation

## 2019-06-24 DIAGNOSIS — I10 Essential (primary) hypertension: Secondary | ICD-10-CM | POA: Diagnosis not present

## 2019-06-24 DIAGNOSIS — Z7984 Long term (current) use of oral hypoglycemic drugs: Secondary | ICD-10-CM | POA: Diagnosis not present

## 2019-06-24 DIAGNOSIS — Z7901 Long term (current) use of anticoagulants: Secondary | ICD-10-CM | POA: Diagnosis not present

## 2019-06-24 DIAGNOSIS — E1151 Type 2 diabetes mellitus with diabetic peripheral angiopathy without gangrene: Secondary | ICD-10-CM | POA: Insufficient documentation

## 2019-06-24 DIAGNOSIS — Z6831 Body mass index (BMI) 31.0-31.9, adult: Secondary | ICD-10-CM | POA: Diagnosis not present

## 2019-06-24 DIAGNOSIS — E785 Hyperlipidemia, unspecified: Secondary | ICD-10-CM | POA: Insufficient documentation

## 2019-06-24 DIAGNOSIS — Z833 Family history of diabetes mellitus: Secondary | ICD-10-CM | POA: Diagnosis not present

## 2019-06-24 DIAGNOSIS — Z884 Allergy status to anesthetic agent status: Secondary | ICD-10-CM | POA: Diagnosis not present

## 2019-06-24 DIAGNOSIS — I480 Paroxysmal atrial fibrillation: Secondary | ICD-10-CM

## 2019-06-24 DIAGNOSIS — E669 Obesity, unspecified: Secondary | ICD-10-CM | POA: Insufficient documentation

## 2019-06-24 DIAGNOSIS — I428 Other cardiomyopathies: Secondary | ICD-10-CM | POA: Insufficient documentation

## 2019-06-24 DIAGNOSIS — Z8249 Family history of ischemic heart disease and other diseases of the circulatory system: Secondary | ICD-10-CM | POA: Insufficient documentation

## 2019-06-24 DIAGNOSIS — Z79899 Other long term (current) drug therapy: Secondary | ICD-10-CM | POA: Diagnosis not present

## 2019-06-24 LAB — CBC WITH DIFFERENTIAL/PLATELET
Abs Immature Granulocytes: 0.02 10*3/uL (ref 0.00–0.07)
Basophils Absolute: 0.1 10*3/uL (ref 0.0–0.1)
Basophils Relative: 1 %
Eosinophils Absolute: 0.1 10*3/uL (ref 0.0–0.5)
Eosinophils Relative: 1 %
HCT: 48.5 % (ref 39.0–52.0)
Hemoglobin: 16 g/dL (ref 13.0–17.0)
Immature Granulocytes: 0 %
Lymphocytes Relative: 42 %
Lymphs Abs: 3.2 10*3/uL (ref 0.7–4.0)
MCH: 30.7 pg (ref 26.0–34.0)
MCHC: 33 g/dL (ref 30.0–36.0)
MCV: 93.1 fL (ref 80.0–100.0)
Monocytes Absolute: 0.7 10*3/uL (ref 0.1–1.0)
Monocytes Relative: 9 %
Neutro Abs: 3.6 10*3/uL (ref 1.7–7.7)
Neutrophils Relative %: 47 %
Platelets: 229 10*3/uL (ref 150–400)
RBC: 5.21 MIL/uL (ref 4.22–5.81)
RDW: 13.2 % (ref 11.5–15.5)
WBC: 7.6 10*3/uL (ref 4.0–10.5)
nRBC: 0 % (ref 0.0–0.2)

## 2019-06-24 LAB — BASIC METABOLIC PANEL
Anion gap: 12 (ref 5–15)
BUN: 20 mg/dL (ref 6–20)
CO2: 26 mmol/L (ref 22–32)
Calcium: 9.9 mg/dL (ref 8.9–10.3)
Chloride: 101 mmol/L (ref 98–111)
Creatinine, Ser: 1.19 mg/dL (ref 0.61–1.24)
GFR calc Af Amer: >60 mL/min (ref >60)
GFR calc non Af Amer: >60 mL/min (ref >60)
Glucose, Bld: 206 mg/dL — ABNORMAL HIGH (ref 70–99)
Potassium: 4.4 mmol/L (ref 3.5–5.1)
Sodium: 139 mmol/L (ref 135–145)

## 2019-06-24 NOTE — H&P (View-Only) (Signed)
Primary Care Physician: System, Provider Not In Primary Cardiologist: none Primary Electrophysiologist: Dr Rayann Heman Referring Physician: [facility]   Charles [name] is a 53 y.o. male with a history of DM, nonischemic CM, OSA, PVD, HTN, HLD, and paroxysmal atrial fibrillation who presents for follow up in the [facility]. Patient is s/p afib ablation with Dr Rayann Heman in 2012 and had not followed up until 08/2018. Patient went to the ER on 06/14/19 with symptoms of SOB, diaphoresis and chest discomfort and was found to be in afib with RVR. He was discharged in rate controlled afib.   On follow up today, patient reports that he feels less SOB now that his heart rate is better controlled. He does still have some fatigue with exertion. He remains in afib today. He denies any missed doses of anticoagulation.   Today, he denies symptoms of palpitations, orthopnea, PND, lower extremity edema, dizziness, presyncope, syncope, bleeding, or neurologic sequela. The patient is tolerating medications without difficulties and is otherwise without complaint today.    Atrial Fibrillation Risk Factors:  he does have symptoms or diagnosis of sleep apnea. he is not compliant with CPAP therapy. he does not have a history of rheumatic fever. he does not have a history of alcohol use. The patient does not have a history of early familial atrial fibrillation or other arrhythmias.  he has a BMI of Body mass index is 31.87 kg/m.Marland Kitchen Filed Weights   06/24/19 1425  Weight: 115.7 kg    Family History  Problem Relation Age of Onset  . Hypertension Mother   . Diabetes Mother   . Alcohol abuse Father   . CAD Father   . Coronary artery disease Sister        with multiple stents  . Coronary artery disease Other        in multiple family members  . Hypertension Brother   . Diabetes Sister   . Diabetes Brother      Atrial Fibrillation Management history:  Previous  antiarrhythmic drugs: sotalol  Previous cardioversions: 2012 Previous ablations: 2012 CHADS2VASC score: 2 Anticoagulation history: Pradaxa, Xarelto   Past Medical History:  Diagnosis Date  . Atrial fibrillation ([place])   . Chronic systolic dysfunction of left ventricle   . Diabetes mellitus    A1c of 8% is an improvement from the previous level of 11%  . Hyperlipidemia   . Hypertension   . LVH (left ventricular hypertrophy)    Echo 08/3012-mild LVH  . Nonischemic cardiomyopathy ([place])    39% - echo on 07/20/2011 -- EF of 45-50% and grade  2 diastolic dysfunction.  . Obesity   . Obstructive sleep apnea    noncompliant with CPAP  . Peripheral vascular disease [facility])    Past Surgical History:  Procedure Laterality Date  . CARDIAC CATHETERIZATION  10/15/04   no sign. CAD  . CARDIOVERSION  08/09/11   successful  . RADIOFREQUENCY ABLATION  10/01/2011   PVI for afib by Dr Rayann Heman    Current Outpatient Medications  Medication Sig Dispense Refill  . Cetirizine HCl 10 MG CAPS Take by mouth daily.     Marland Kitchen diltiazem (CARDIZEM CD) 240 MG 24 hr capsule Take 1 capsule (240 mg total) by mouth daily. 30 capsule 3  . FARXIGA 10 MG TABS tablet Take 1 tablet by mouth daily.  1  . glipiZIDE (GLUCOTROL) 5 MG tablet Take 5 mg by mouth 2 (two) times daily before a meal.     .  lisinopril (ZESTRIL) 40 MG tablet Take 40 mg by mouth daily.    . metFORMIN (GLUCOPHAGE-XR) 500 MG 24 hr tablet Take 1,000 mg by mouth 2 (two) times a day.     . metoprolol succinate (TOPROL-XL) 50 MG 24 hr tablet Take 1 tablet (50 mg total) by mouth 2 (two) times a day. 30 tablet 3  . TRULICITY 1.5 MG/0.5ML SOPN Inject 0.5 mLs into the skin once a week.  6  . XARELTO 20 MG TABS tablet Take 1 tablet by mouth daily.  6   No current facility-administered medications for this encounter.     Allergies  Allergen Reactions  . Procaine Hcl Nausea And Vomiting    Social History   Socioeconomic History  . Marital status: Divorced     Spouse name: Not on file  . Number of children: Not on file  . Years of education: Not on file  . Highest education level: Not on file  Occupational History  . Not on file  Social Needs  . Financial resource strain: Not on file  . Food insecurity    Worry: Not on file    Inability: Not on file  . Transportation needs    Medical: Not on file    Non-medical: Not on file  Tobacco Use  . Smoking status: Never Smoker  . Smokeless tobacco: Never Used  Substance and Sexual Activity  . Alcohol use: Yes    Comment: seldom  . Drug use: No  . Sexual activity: Not on file  Lifestyle  . Physical activity    Days per week: Not on file    Minutes per session: Not on file  . Stress: Not on file  Relationships  . Social Musicianconnections    Talks on phone: Not on file    Gets together: Not on file    Attends religious service: Not on file    Active member of club or organization: Not on file    Attends meetings of clubs or organizations: Not on file    Relationship status: Not on file  . Intimate partner violence    Fear of current or ex partner: Not on file    Emotionally abused: Not on file    Physically abused: Not on file    Forced sexual activity: Not on file  Other Topics Concern  . 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  Physical Exam: Vitals:   06/24/19 1425  BP: 134/74  Pulse: 97  Weight: 115.7 kg  Height: 6\' 3"  (1.905 m)    GEN- The patient is well appearing obese male, alert and oriented x 3 today.   HEENT-head normocephalic, atraumatic, sclera clear, conjunctiva pink, hearing intact, trachea midline. Lungs- Clear to ausculation bilaterally, normal work of breathing Heart- irregular rate and rhythm, no murmurs, rubs or gallops  GI- soft, NT, ND, + BS Extremities- no clubbing, cyanosis, or edema MS- no significant deformity  or atrophy Skin- no rash or lesion Psych- euthymic mood, full affect Neuro- strength and sensation are intact   Wt Readings from Last 3 Encounters:  06/24/19 115.7 kg  06/17/19 115.7 kg  06/14/19 113.4 kg    EKG today demonstrates afib HR 97, NST, PVC, QRS 90, QTc 436  Echo 08/27/18 demonstrated  - Left ventricle: The cavity size was  normal. Wall thickness was   increased in a pattern of mild LVH. Systolic function was normal.   The estimated ejection fraction was in the range of 55% to 60%.   Wall motion was normal; there were no regional wall motion   abnormalities. Left ventricular diastolic function parameters   were normal. - Aortic valve: There was mild regurgitation.  Epic records are reviewed at length today  Assessment and Plan:  1. Persistent atrial fibrillation The patient has paroxysmal atrial fibrillation.   S/p ablation 2012 with Dr Johney Frame. Patient appears to be in persistent afib.  Will arrange DCCV. Patient reports no missed doses of anticoagulation. Continue diltiazem 240 mg for rate control. Continue Toprol 50 mg BID. May consider decreasing after DCCV if bradycardic.  Continue Xarelto 20 mg daily. We also discussed possibility of AAD therapy vs repeat ablation today. Lifestyle changes as below. Bmet/CBC  This patients CHA2DS2-VASc Score and unadjusted Ischemic Stroke Rate (% per year) is equal to 2.2 % stroke rate/year from a score of 2  Above score calculated as 1 point each if present [CHF, HTN, DM, Vascular=MI/PAD/Aortic Plaque, Age if 65-74, or Male] Above score calculated as 2 points each if present [Age > 75, or Stroke/TIA/TE]   2. Obesity Body mass index is 31.87 kg/m. Lifestyle modification was discussed and encouraged including regular physical activity and weight reduction.  3. Obstructive sleep apnea The importance of adequate treatment of sleep apnea was discussed today in order to improve our ability to maintain sinus rhythm long  term. Sleep study is pending to restart patient on CPAP therapy.   4. HTN Stable, no change today.   Follow up one week post DCCV.   Jorja Loa PA-C [facility] [address] [place], [place] [address] [phone] 06/24/2019 2:59 PM

## 2019-06-24 NOTE — Addendum Note (Signed)
Encounter addended by: Iona Hansen, CMA on: 06/24/2019 3:08 PM  Actions taken: Clinical Note Signed

## 2019-06-24 NOTE — Progress Notes (Signed)
Primary Care Physician: System, Provider Not In Primary Cardiologist: none Primary Electrophysiologist: Dr Rayann Heman Referring Physician: [facility]   Charles [name] is a 53 y.o. male with a history of DM, nonischemic CM, OSA, PVD, HTN, HLD, and paroxysmal atrial fibrillation who presents for follow up in the [facility]. Patient is s/p afib ablation with Dr Rayann Heman in 2012 and had not followed up until 08/2018. Patient went to the ER on 06/14/19 with symptoms of SOB, diaphoresis and chest discomfort and was found to be in afib with RVR. He was discharged in rate controlled afib.   On follow up today, patient reports that he feels less SOB now that his heart rate is better controlled. He does still have some fatigue with exertion. He remains in afib today. He denies any missed doses of anticoagulation.   Today, he denies symptoms of palpitations, orthopnea, PND, lower extremity edema, dizziness, presyncope, syncope, bleeding, or neurologic sequela. The patient is tolerating medications without difficulties and is otherwise without complaint today.    Atrial Fibrillation Risk Factors:  he does have symptoms or diagnosis of sleep apnea. he is not compliant with CPAP therapy. he does not have a history of rheumatic fever. he does not have a history of alcohol use. The patient does not have a history of early familial atrial fibrillation or other arrhythmias.  he has a BMI of Body mass index is 31.87 kg/m.Marland Kitchen Filed Weights   06/24/19 1425  Weight: 115.7 kg    Family History  Problem Relation Age of Onset  . Hypertension Mother   . Diabetes Mother   . Alcohol abuse Father   . CAD Father   . Coronary artery disease Sister        with multiple stents  . Coronary artery disease Other        in multiple family members  . Hypertension Brother   . Diabetes Sister   . Diabetes Brother      Atrial Fibrillation Management history:  Previous  antiarrhythmic drugs: sotalol  Previous cardioversions: 2012 Previous ablations: 2012 CHADS2VASC score: 2 Anticoagulation history: Pradaxa, Xarelto   Past Medical History:  Diagnosis Date  . Atrial fibrillation ([place])   . Chronic systolic dysfunction of left ventricle   . Diabetes mellitus    A1c of 8% is an improvement from the previous level of 11%  . Hyperlipidemia   . Hypertension   . LVH (left ventricular hypertrophy)    Echo 08/3012-mild LVH  . Nonischemic cardiomyopathy ([place])    39% - echo on 07/20/2011 -- EF of 45-50% and grade  2 diastolic dysfunction.  . Obesity   . Obstructive sleep apnea    noncompliant with CPAP  . Peripheral vascular disease [facility])    Past Surgical History:  Procedure Laterality Date  . CARDIAC CATHETERIZATION  10/15/04   no sign. CAD  . CARDIOVERSION  08/09/11   successful  . RADIOFREQUENCY ABLATION  10/01/2011   PVI for afib by Dr Rayann Heman    Current Outpatient Medications  Medication Sig Dispense Refill  . Cetirizine HCl 10 MG CAPS Take by mouth daily.     Marland Kitchen diltiazem (CARDIZEM CD) 240 MG 24 hr capsule Take 1 capsule (240 mg total) by mouth daily. 30 capsule 3  . FARXIGA 10 MG TABS tablet Take 1 tablet by mouth daily.  1  . glipiZIDE (GLUCOTROL) 5 MG tablet Take 5 mg by mouth 2 (two) times daily before a meal.     .  lisinopril (ZESTRIL) 40 MG tablet Take 40 mg by mouth daily.    . metFORMIN (GLUCOPHAGE-XR) 500 MG 24 hr tablet Take 1,000 mg by mouth 2 (two) times a day.     . metoprolol succinate (TOPROL-XL) 50 MG 24 hr tablet Take 1 tablet (50 mg total) by mouth 2 (two) times a day. 30 tablet 3  . TRULICITY 1.5 MG/0.5ML SOPN Inject 0.5 mLs into the skin once a week.  6  . XARELTO 20 MG TABS tablet Take 1 tablet by mouth daily.  6   No current facility-administered medications for this encounter.     Allergies  Allergen Reactions  . Procaine Hcl Nausea And Vomiting    Social History   Socioeconomic History  . Marital status: Divorced     Spouse name: Not on file  . Number of children: Not on file  . Years of education: Not on file  . Highest education level: Not on file  Occupational History  . Not on file  Social Needs  . Financial resource strain: Not on file  . Food insecurity    Worry: Not on file    Inability: Not on file  . Transportation needs    Medical: Not on file    Non-medical: Not on file  Tobacco Use  . Smoking status: Never Smoker  . Smokeless tobacco: Never Used  Substance and Sexual Activity  . Alcohol use: Yes    Comment: seldom  . Drug use: No  . Sexual activity: Not on file  Lifestyle  . Physical activity    Days per week: Not on file    Minutes per session: Not on file  . Stress: Not on file  Relationships  . Social connections    Talks on phone: Not on file    Gets together: Not on file    Attends religious service: Not on file    Active member of club or organization: Not on file    Attends meetings of clubs or organizations: Not on file    Relationship status: Not on file  . Intimate partner violence    Fear of current or ex partner: Not on file    Emotionally abused: Not on file    Physically abused: Not on file    Forced sexual activity: Not on file  Other Topics Concern  . 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  Physical Exam: Vitals:   06/24/19 1425  BP: 134/74  Pulse: 97  Weight: 115.7 kg  Height: 6' 3" (1.905 m)    GEN- The patient is well appearing obese male, alert and oriented x 3 today.   HEENT-head normocephalic, atraumatic, sclera clear, conjunctiva pink, hearing intact, trachea midline. Lungs- Clear to ausculation bilaterally, normal work of breathing Heart- irregular rate and rhythm, no murmurs, rubs or gallops  GI- soft, NT, ND, + BS Extremities- no clubbing, cyanosis, or edema MS- no significant deformity  or atrophy Skin- no rash or lesion Psych- euthymic mood, full affect Neuro- strength and sensation are intact   Wt Readings from Last 3 Encounters:  06/24/19 115.7 kg  06/17/19 115.7 kg  06/14/19 113.4 kg    EKG today demonstrates afib HR 97, NST, PVC, QRS 90, QTc 436  Echo 08/27/18 demonstrated  - Left ventricle: The cavity size was   normal. Wall thickness was   increased in a pattern of mild LVH. Systolic function was normal.   The estimated ejection fraction was in the range of 55% to 60%.   Wall motion was normal; there were no regional wall motion   abnormalities. Left ventricular diastolic function parameters   were normal. - Aortic valve: There was mild regurgitation.  Epic records are reviewed at length today  Assessment and Plan:  1. Persistent atrial fibrillation The patient has paroxysmal atrial fibrillation.   S/p ablation 2012 with Dr Johney Frame. Patient appears to be in persistent afib.  Will arrange DCCV. Patient reports no missed doses of anticoagulation. Continue diltiazem 240 mg for rate control. Continue Toprol 50 mg BID. May consider decreasing after DCCV if bradycardic.  Continue Xarelto 20 mg daily. We also discussed possibility of AAD therapy vs repeat ablation today. Lifestyle changes as below. Bmet/CBC  This patients CHA2DS2-VASc Score and unadjusted Ischemic Stroke Rate (% per year) is equal to 2.2 % stroke rate/year from a score of 2  Above score calculated as 1 point each if present [CHF, HTN, DM, Vascular=MI/PAD/Aortic Plaque, Age if 65-74, or Male] Above score calculated as 2 points each if present [Age > 75, or Stroke/TIA/TE]   2. Obesity Body mass index is 31.87 kg/m. Lifestyle modification was discussed and encouraged including regular physical activity and weight reduction.  3. Obstructive sleep apnea The importance of adequate treatment of sleep apnea was discussed today in order to improve our ability to maintain sinus rhythm long  term. Sleep study is pending to restart patient on CPAP therapy.   4. HTN Stable, no change today.   Follow up one week post DCCV.   Jorja Loa PA-C [facility] [address] [place], [place] [address] [phone] 06/24/2019 2:59 PM

## 2019-06-24 NOTE — Patient Instructions (Signed)
Your cardioversion is scheduled for : 07/07/2019 at 8:15 Arrive at the [facility] and go to admitting at 7:00 a.m. Do Not eat or drink anything after midnight the night prior to your procedure. Take all your medications with a sip of water prior to arrival. Do NOT miss any doses of your blood thinner. You will NOT be able to drive home after your procedure.

## 2019-06-25 ENCOUNTER — Telehealth: Payer: Self-pay | Admitting: *Deleted

## 2019-06-25 NOTE — Telephone Encounter (Signed)
-----   Message from Juluis Mire, RN sent at 06/17/2019  3:40 PM EDT ----- Regarding: sleep study Pt needs sleep study per ricky fenton - previous diagnosis of sleep apnea, snoring, daytime somnolence  Thanks Marzetta Board

## 2019-06-25 NOTE — Telephone Encounter (Signed)
Staff message sent to Charles [name] per [employer] web portal no PA Is required for sleep study. Ok to schedule.

## 2019-06-29 ENCOUNTER — Other Ambulatory Visit (HOSPITAL_COMMUNITY): Payer: Self-pay | Admitting: *Deleted

## 2019-06-29 ENCOUNTER — Telehealth: Payer: Self-pay | Admitting: *Deleted

## 2019-06-29 NOTE — Telephone Encounter (Signed)
-----   Message from Lauralee Evener, [place] sent at 06/25/2019  5:15 PM EDT ----- Regarding: RE: sleep study Per [employer] no PA IS REQUIRED. Ok to schedule sleep study. ----- Message ----- From: Juluis Mire, RN Sent: 06/17/2019   3:40 PM EDT To: [facility] Studies Subject: sleep study                                    Pt needs sleep study per ricky fenton - previous diagnosis of sleep apnea, snoring, daytime somnolence  Thanks Marzetta Board

## 2019-06-29 NOTE — Telephone Encounter (Signed)
Patient is scheduled for lab study on 8/2. Patient understands her sleep study will be done at [facility] sleep lab. Patient understands she will receive a sleep packet in a week or so. Patient understands to call if she does not receive the sleep packet in a timely manner. He is scheduled for COVID screening on 7/30 3:20  prior to titration.    Left detailed message on voicemail with date and time of sleep study and informed patient to call back to confirm or reschedule.

## 2019-07-06 ENCOUNTER — Other Ambulatory Visit (HOSPITAL_COMMUNITY)
Admission: RE | Admit: 2019-07-06 | Discharge: 2019-07-06 | Disposition: A | Discharge status: Home / Self Care | Payer: BC Managed Care – PPO | Source: Ambulatory Visit | Attending: Cardiovascular Disease | Admitting: Cardiovascular Disease

## 2019-07-06 ENCOUNTER — Other Ambulatory Visit (HOSPITAL_COMMUNITY): Payer: Self-pay | Admitting: *Deleted

## 2019-07-06 DIAGNOSIS — Z20828 Contact with and (suspected) exposure to other viral communicable diseases: Secondary | ICD-10-CM | POA: Insufficient documentation

## 2019-07-06 LAB — SARS CORONAVIRUS 2 (TAT 6-24 HRS): SARS Coronavirus 2: NEGATIVE

## 2019-07-07 NOTE — Telephone Encounter (Signed)
Patient called back to postpone his sleep study due to another medical procedure scheduled for the same time. Patient states he will call us back to reschedule.

## 2019-07-08 ENCOUNTER — Other Ambulatory Visit (HOSPITAL_COMMUNITY): Payer: BC Managed Care – PPO

## 2019-07-08 NOTE — Progress Notes (Signed)
Spoke with pt who states that he has remained quarantined and is not experiencing and s/s of COVID 19 since his screening. All questions answered appropriately. Jobe Igo, RN

## 2019-07-09 ENCOUNTER — Ambulatory Visit (HOSPITAL_COMMUNITY): Payer: BC Managed Care – PPO | Admitting: Anesthesiology

## 2019-07-09 ENCOUNTER — Other Ambulatory Visit: Payer: Self-pay

## 2019-07-09 ENCOUNTER — Encounter (HOSPITAL_COMMUNITY): Payer: Self-pay

## 2019-07-09 ENCOUNTER — Ambulatory Visit (HOSPITAL_COMMUNITY)
Admission: RE | Admit: 2019-07-09 | Discharge: 2019-07-09 | Disposition: A | Discharge status: Home / Self Care | Payer: BC Managed Care – PPO | Attending: Internal Medicine | Admitting: Internal Medicine

## 2019-07-09 ENCOUNTER — Encounter (HOSPITAL_COMMUNITY)
Admission: RE | Disposition: A | Discharge status: Home / Self Care | Payer: BC Managed Care – PPO | Source: Home / Self Care | Attending: Internal Medicine

## 2019-07-09 DIAGNOSIS — I5022 Chronic systolic (congestive) heart failure: Secondary | ICD-10-CM | POA: Diagnosis not present

## 2019-07-09 DIAGNOSIS — Z79899 Other long term (current) drug therapy: Secondary | ICD-10-CM | POA: Insufficient documentation

## 2019-07-09 DIAGNOSIS — Z6831 Body mass index (BMI) 31.0-31.9, adult: Secondary | ICD-10-CM | POA: Insufficient documentation

## 2019-07-09 DIAGNOSIS — Z8249 Family history of ischemic heart disease and other diseases of the circulatory system: Secondary | ICD-10-CM | POA: Diagnosis not present

## 2019-07-09 DIAGNOSIS — E785 Hyperlipidemia, unspecified: Secondary | ICD-10-CM | POA: Diagnosis not present

## 2019-07-09 DIAGNOSIS — E119 Type 2 diabetes mellitus without complications: Secondary | ICD-10-CM | POA: Diagnosis not present

## 2019-07-09 DIAGNOSIS — Z7984 Long term (current) use of oral hypoglycemic drugs: Secondary | ICD-10-CM | POA: Diagnosis not present

## 2019-07-09 DIAGNOSIS — E669 Obesity, unspecified: Secondary | ICD-10-CM | POA: Diagnosis not present

## 2019-07-09 DIAGNOSIS — Z833 Family history of diabetes mellitus: Secondary | ICD-10-CM | POA: Insufficient documentation

## 2019-07-09 DIAGNOSIS — I480 Paroxysmal atrial fibrillation: Secondary | ICD-10-CM | POA: Diagnosis not present

## 2019-07-09 DIAGNOSIS — I2510 Atherosclerotic heart disease of native coronary artery without angina pectoris: Secondary | ICD-10-CM | POA: Diagnosis not present

## 2019-07-09 DIAGNOSIS — I4819 Other persistent atrial fibrillation: Secondary | ICD-10-CM | POA: Diagnosis not present

## 2019-07-09 DIAGNOSIS — Z7901 Long term (current) use of anticoagulants: Secondary | ICD-10-CM | POA: Insufficient documentation

## 2019-07-09 DIAGNOSIS — I428 Other cardiomyopathies: Secondary | ICD-10-CM | POA: Diagnosis not present

## 2019-07-09 DIAGNOSIS — I4891 Unspecified atrial fibrillation: Secondary | ICD-10-CM

## 2019-07-09 DIAGNOSIS — E1151 Type 2 diabetes mellitus with diabetic peripheral angiopathy without gangrene: Secondary | ICD-10-CM | POA: Insufficient documentation

## 2019-07-09 DIAGNOSIS — G4733 Obstructive sleep apnea (adult) (pediatric): Secondary | ICD-10-CM | POA: Diagnosis not present

## 2019-07-09 DIAGNOSIS — I110 Hypertensive heart disease with heart failure: Secondary | ICD-10-CM | POA: Insufficient documentation

## 2019-07-09 DIAGNOSIS — I10 Essential (primary) hypertension: Secondary | ICD-10-CM | POA: Diagnosis not present

## 2019-07-09 DIAGNOSIS — G4730 Sleep apnea, unspecified: Secondary | ICD-10-CM | POA: Diagnosis not present

## 2019-07-09 HISTORY — PX: CARDIOVERSION: SHX1299

## 2019-07-09 LAB — BASIC METABOLIC PANEL
Anion gap: 11 (ref 5–15)
BUN: 12 mg/dL (ref 6–20)
CO2: 27 mmol/L (ref 22–32)
Calcium: 9.3 mg/dL (ref 8.9–10.3)
Chloride: 103 mmol/L (ref 98–111)
Creatinine, Ser: 0.95 mg/dL (ref 0.61–1.24)
GFR calc Af Amer: >60 mL/min (ref >60)
GFR calc non Af Amer: >60 mL/min (ref >60)
Glucose, Bld: 186 mg/dL — ABNORMAL HIGH (ref 70–99)
Potassium: 4.3 mmol/L (ref 3.5–5.1)
Sodium: 141 mmol/L (ref 135–145)

## 2019-07-09 LAB — GLUCOSE, CAPILLARY: Glucose-Capillary: 170 mg/dL — ABNORMAL HIGH (ref 70–99)

## 2019-07-09 SURGERY — CARDIOVERSION
Anesthesia: General

## 2019-07-09 MED ORDER — LIDOCAINE 2% (20 MG/ML) 5 ML SYRINGE
INTRAMUSCULAR | Status: DC | PRN
  Administered 2019-07-09: 20 mg via INTRAVENOUS

## 2019-07-09 MED ORDER — PROPOFOL 10 MG/ML IV BOLUS
INTRAVENOUS | Status: DC | PRN
  Administered 2019-07-09: 70 mg via INTRAVENOUS

## 2019-07-09 MED ORDER — SODIUM CHLORIDE 0.9 % IV SOLN
INTRAVENOUS | Status: DC
  Administered 2019-07-09: 08:00:00 via INTRAVENOUS

## 2019-07-09 NOTE — Anesthesia Postprocedure Evaluation (Signed)
Anesthesia Post Note  Patient: Charles [name]  Procedure(s) Performed: CARDIOVERSION (N/A )     Patient location during evaluation: PACU Anesthesia Type: General Level of consciousness: awake and alert Pain management: pain level controlled Vital Signs Assessment: post-procedure vital signs reviewed and stable Respiratory status: spontaneous breathing, nonlabored ventilation, respiratory function stable and patient connected to nasal cannula oxygen Cardiovascular status: blood pressure returned to baseline and stable Postop Assessment: no apparent nausea or vomiting Anesthetic complications: no    Last Vitals:  Vitals:   07/09/19 0835 07/09/19 0845  BP: (!) 138/97 (!) 143/88  Pulse: 84 89  Resp: (!) 23 (!) 22  Temp:    SpO2: 94% 94%    Last Pain:  Vitals:   07/09/19 0825  TempSrc: Oral  PainSc: 0-No pain                 Tiajuana Amass

## 2019-07-09 NOTE — Interval H&P Note (Signed)
History and Physical Interval Note:  07/09/2019 8:12 AM  Charles [name]  has presented today for surgery, with the diagnosis of AFIB.  The various methods of treatment have been discussed with the patient and family. After consideration of risks, benefits and other options for treatment, the patient has consented to  Procedure(s): CARDIOVERSION (N/A) as a surgical intervention.  The patient's history has been reviewed, patient examined, no change in status, stable for surgery.  I have reviewed the patient's chart and labs.  Questions were answered to the patient's satisfaction.     Dorris Carnes

## 2019-07-09 NOTE — CV Procedure (Signed)
Cardioversion  Patient anesthetized by anesthesia with propofol and lidocaine intravenously  With pads in AP position patient was cardioverted to SR with 200 J synchronized biphasic energy   Procedure was without complications  Dorris Carnes MD

## 2019-07-09 NOTE — Discharge Instructions (Signed)
YOU HAD AN CARDIAC PROCEDURE TODAY: Refer to the procedure report and other information in the discharge instructions given to you for any specific questions about what was found during the examination. If this information does not answer your questions, please call [facility] office at [phone] to clarify.  ° °DIET: Your first meal following the procedure should be a light meal and then it is ok to progress to your normal diet. A half-sandwich or bowl of soup is an example of a good first meal. Heavy or fried foods are harder to digest and may make you feel nauseous or bloated. Drink plenty of fluids but you should avoid alcoholic beverages for 24 hours. If you had a esophageal dilation, please see attached instructions for diet.  ° °ACTIVITY: Your care partner should take you home directly after the procedure. You should plan to take it easy, moving slowly for the rest of the day. You can resume normal activity the day after the procedure however YOU SHOULD NOT DRIVE, use power tools, machinery or perform tasks that involve climbing or major physical exertion for 24 hours (because of the sedation medicines used during the test).  ° °SYMPTOMS TO REPORT IMMEDIATELY: °A cardiologist can be reached at any hour. Please call [phone] for any of the following symptoms:  °Vomiting of blood or coffee ground material  °New, significant abdominal pain  °New, significant chest pain or pain under the shoulder blades  °Painful or persistently difficult swallowing  °New shortness of breath  °Black, tarry-looking or red, bloody stools ° °FOLLOW UP:  °Please also call with any specific questions about appointments or follow up tests. ° ° °

## 2019-07-09 NOTE — Anesthesia Preprocedure Evaluation (Signed)
Anesthesia Evaluation  Patient identified by MRN, date of birth, ID band Patient awake    Reviewed: Allergy & Precautions, NPO status , Patient's Chart, lab work & pertinent test results  Airway Mallampati: II  TM Distance: >3 FB Neck ROM: Full    Dental   Pulmonary sleep apnea ,    breath sounds clear to auscultation       Cardiovascular hypertension, Pt. on medications and Pt. on home beta blockers + Peripheral Vascular Disease  + dysrhythmias Atrial Fibrillation  Rhythm:Irregular Rate:Normal     Neuro/Psych negative neurological ROS     GI/Hepatic negative GI ROS, Neg liver ROS,   Endo/Other  diabetes, Type 2  Renal/GU negative Renal ROS     Musculoskeletal   Abdominal   Peds  Hematology negative hematology ROS (+)   Anesthesia Other Findings   Reproductive/Obstetrics                             Anesthesia Physical Anesthesia Plan  ASA: III  Anesthesia Plan: General   Post-op Pain Management:    Induction:   PONV Risk Score and Plan: Treatment may vary due to age or medical condition  Airway Management Planned: Mask and Natural Airway  Additional Equipment:   Intra-op Plan:   Post-operative Plan:   Informed Consent: I have reviewed the patients History and Physical, chart, labs and discussed the procedure including the risks, benefits and alternatives for the proposed anesthesia with the patient or authorized representative who has indicated his/her understanding and acceptance.       Plan Discussed with: CRNA  Anesthesia Plan Comments:         Anesthesia Quick Evaluation

## 2019-07-09 NOTE — Transfer of Care (Signed)
Immediate Anesthesia Transfer of Care Note  Patient: Charles [name]  Procedure(s) Performed: CARDIOVERSION (N/A )  Patient Location: PACU  Anesthesia Type:MAC  Level of Consciousness: drowsy  Airway & Oxygen Therapy: Patient Spontanous Breathing  Post-op Assessment: Report given to RN and Post -op Vital signs reviewed and stable  Post vital signs: Reviewed and stable  Last Vitals:  Vitals Value Taken Time  BP 157/98   Temp    Pulse 88   Resp 16   SpO2 95     Last Pain:  Vitals:   07/09/19 0725  TempSrc: Oral  PainSc: 0-No pain         Complications: No apparent anesthesia complications

## 2019-07-11 ENCOUNTER — Encounter (HOSPITAL_BASED_OUTPATIENT_CLINIC_OR_DEPARTMENT_OTHER): Payer: BC Managed Care – PPO

## 2019-07-11 ENCOUNTER — Emergency Department (HOSPITAL_COMMUNITY): Payer: BC Managed Care – PPO

## 2019-07-11 ENCOUNTER — Telehealth: Payer: Self-pay | Admitting: Cardiology

## 2019-07-11 ENCOUNTER — Emergency Department (HOSPITAL_COMMUNITY)
Admission: EM | Admit: 2019-07-11 | Discharge: 2019-07-11 | Disposition: A | Discharge status: Home / Self Care | Payer: BC Managed Care – PPO | Attending: Emergency Medicine | Admitting: Emergency Medicine

## 2019-07-11 ENCOUNTER — Encounter (HOSPITAL_COMMUNITY): Payer: Self-pay | Admitting: *Deleted

## 2019-07-11 DIAGNOSIS — I10 Essential (primary) hypertension: Secondary | ICD-10-CM | POA: Diagnosis not present

## 2019-07-11 DIAGNOSIS — I4892 Unspecified atrial flutter: Secondary | ICD-10-CM

## 2019-07-11 DIAGNOSIS — R0602 Shortness of breath: Secondary | ICD-10-CM | POA: Diagnosis not present

## 2019-07-11 DIAGNOSIS — Z79899 Other long term (current) drug therapy: Secondary | ICD-10-CM | POA: Diagnosis not present

## 2019-07-11 DIAGNOSIS — R9431 Abnormal electrocardiogram [ECG] [EKG]: Secondary | ICD-10-CM | POA: Diagnosis not present

## 2019-07-11 DIAGNOSIS — E1151 Type 2 diabetes mellitus with diabetic peripheral angiopathy without gangrene: Secondary | ICD-10-CM | POA: Diagnosis not present

## 2019-07-11 DIAGNOSIS — I499 Cardiac arrhythmia, unspecified: Secondary | ICD-10-CM | POA: Diagnosis present

## 2019-07-11 DIAGNOSIS — I480 Paroxysmal atrial fibrillation: Secondary | ICD-10-CM | POA: Diagnosis not present

## 2019-07-11 LAB — CBC
HCT: 47.5 % (ref 39.0–52.0)
Hemoglobin: 15.9 g/dL (ref 13.0–17.0)
MCH: 30.9 pg (ref 26.0–34.0)
MCHC: 33.5 g/dL (ref 30.0–36.0)
MCV: 92.4 fL (ref 80.0–100.0)
Platelets: 221 10*3/uL (ref 150–400)
RBC: 5.14 MIL/uL (ref 4.22–5.81)
RDW: 13.4 % (ref 11.5–15.5)
WBC: 7.7 10*3/uL (ref 4.0–10.5)
nRBC: 0 % (ref 0.0–0.2)

## 2019-07-11 LAB — BASIC METABOLIC PANEL
Anion gap: 14 (ref 5–15)
BUN: 13 mg/dL (ref 6–20)
CO2: 21 mmol/L — ABNORMAL LOW (ref 22–32)
Calcium: 9.5 mg/dL (ref 8.9–10.3)
Chloride: 104 mmol/L (ref 98–111)
Creatinine, Ser: 1 mg/dL (ref 0.61–1.24)
GFR calc Af Amer: >60 mL/min (ref >60)
GFR calc non Af Amer: >60 mL/min (ref >60)
Glucose, Bld: 263 mg/dL — ABNORMAL HIGH (ref 70–99)
Potassium: 3.7 mmol/L (ref 3.5–5.1)
Sodium: 139 mmol/L (ref 135–145)

## 2019-07-11 MED ORDER — ETOMIDATE 2 MG/ML IV SOLN: 10.0000 mg | Freq: Once | INTRAVENOUS | Status: DC

## 2019-07-11 MED ORDER — SODIUM CHLORIDE 0.9% FLUSH: 3.0000 mL | INTRAVENOUS | Status: DC | PRN

## 2019-07-11 MED ORDER — SODIUM CHLORIDE 0.9% FLUSH: 3.0000 mL | Freq: Once | INTRAVENOUS | Status: DC

## 2019-07-11 NOTE — ED Provider Notes (Signed)
[facility] Provider Note   CSN: [id number] Arrival date & time: 07/11/19  1226    History   Chief Complaint Chief Complaint  Patient presents with  . Irregular Heart Beat    HPI Charles [name] is a 53 y.o. male.     53 year old male with history of A. fib, previous ablation, successful cardioversion done on July 31 with Dr. Tenny Crawoss.  Patient states he woke up at 5:00 this morning with pain in the back of his head and neck and noticed that his heart was racing, felt short of breath and diaphoretic.  Patient states that he called his cardiologist on his way to the emergency room he was called back and told he could follow-up in clinic or continue to the ER, patient decided to continue to the emergency room.  On arrival patient was found to be in a flutter with a rate of 150, at the time of his exam, rate currently 100, shortness of breath and diaphoresis have resolved, has not had pain in the back of his head or neck since this morning.  Patient is feeling well at this time, no complaints or concerns other than feeling occasionally like his heart races.  Patient reports compliance with his medications including his Toprol, Xarelto, Cardizem.     Past Medical History:  Diagnosis Date  . Atrial fibrillation (HCC)   . Chronic systolic dysfunction of left ventricle   . Diabetes mellitus    A1c of 8% is an improvement from the previous level of 11%  . Hyperlipidemia   . Hypertension   . LVH (left ventricular hypertrophy)    Echo 08/3012-mild LVH  . Nonischemic cardiomyopathy (HCC)    39% - echo on 07/20/2011 -- EF of 45-50% and grade  2 diastolic dysfunction.  . Obesity   . Obstructive sleep apnea    noncompliant with CPAP  . Peripheral vascular disease [facility](HCC)     Patient Active Problem List   Diagnosis Date Noted  . Pre-operative clearance 03/05/2017  . Hypertension 01/06/2012  . PAF (paroxysmal atrial fibrillation) (HCC) 09/11/2011  . Sleep  apnea 09/11/2011  . Chronic systolic dysfunction of left ventricle 09/11/2011    Past Surgical History:  Procedure Laterality Date  . CARDIAC CATHETERIZATION  10/15/04   no sign. CAD  . CARDIOVERSION  08/09/11   successful  . RADIOFREQUENCY ABLATION  10/01/2011   PVI for afib by Dr Johney FrameAllred        Home Medications    Prior to Admission medications   Medication Sig Start Date End Date Taking? Authorizing Provider  Apple Cider Vinegar 500 MG TABS Take 2 tablets by mouth 2 (two) times a day.    [provider]  cetirizine (ZYRTEC) 10 MG tablet Take 10 mg by mouth daily.     [provider]  diltiazem (CARDIZEM CD) 240 MG 24 hr capsule Take 1 capsule (240 mg total) by mouth daily. 06/17/19   Fenton, Clint R, PA  FARXIGA 10 MG TABS tablet Take 10 mg by mouth daily.  02/17/17   [provider]  glipiZIDE (GLUCOTROL) 5 MG tablet Take 5 mg by mouth 2 (two) times daily before a meal.  09/04/11   [provider]  lisinopril (ZESTRIL) 40 MG tablet Take 40 mg by mouth daily.    [provider]  metFORMIN (GLUCOPHAGE-XR) 500 MG 24 hr tablet Take 1,000 mg by mouth 2 (two) times a day.     [provider]  metoprolol succinate (TOPROL-XL) 50 MG 24 hr tablet Take 1 tablet (50 mg total) by mouth 2 (two) times a day. 06/14/19   Vanessa Anna Maria, MD  TRULICITY 1.5 WG/9.5AO SOPN Inject 1.5 mg into the skin once a week. Sundays 07/26/18   [provider]  XARELTO 20 MG TABS tablet Take 20 mg by mouth daily.  07/15/18   [provider]    Family History Family History  Problem Relation Age of Onset  . Hypertension Mother   . Diabetes Mother   . Alcohol abuse Father   . CAD Father   . Coronary artery disease Sister        with multiple stents  . Coronary artery disease Other        in multiple family members  . Hypertension Brother   . Diabetes Sister   . Diabetes Brother     Social History Social History   Tobacco Use  . Smoking  status: Never Smoker  . Smokeless tobacco: Never Used  Substance Use Topics  . Alcohol use: Yes    Comment: seldom  . Drug use: No     Allergies   Procaine hcl   Review of Systems Review of Systems  Constitutional: Positive for diaphoresis. Negative for chills and fever.  Respiratory: Positive for shortness of breath. Negative for chest tightness.   Cardiovascular: Negative for chest pain.  Gastrointestinal: Negative for nausea and vomiting.  Musculoskeletal: Positive for neck pain.  Skin: Negative for rash and wound.  Allergic/Immunologic: Positive for immunocompromised state.  Neurological: Positive for headaches.  Hematological: Bruises/bleeds easily.  Psychiatric/Behavioral: Negative for confusion.  All other systems reviewed and are negative.    Physical Exam Updated Vital Signs BP (!) 131/92   Pulse 78   Temp 98.7 F (37.1 C) (Oral)   Resp (!) 23   SpO2 93%   Physical Exam Vitals signs and nursing note reviewed.  Constitutional:      General: He is not in acute distress.    Appearance: He is well-developed. He is not diaphoretic.  HENT:     Head: Normocephalic and atraumatic.  Neck:     Musculoskeletal: No muscular tenderness.  Cardiovascular:     Rate and Rhythm: Tachycardia present. Rhythm irregular.     Pulses: Normal pulses.     Heart sounds: Normal heart sounds.  Pulmonary:     Effort: Pulmonary effort is normal.     Breath sounds: Normal breath sounds.  Abdominal:     Tenderness: There is no abdominal tenderness.  Musculoskeletal:        General: No swelling or tenderness.     Right lower leg: No edema.     Left lower leg: No edema.  Skin:    General: Skin is warm and dry.  Neurological:     Mental Status: He is alert and oriented to person, place, and time.  Psychiatric:        Behavior: Behavior normal.      ED Treatments / Results  Labs (all labs ordered are listed, but only abnormal results are displayed) Labs Reviewed  BASIC  METABOLIC PANEL - Abnormal; Notable for the following components:      Result Value   CO2 21 (*)    Glucose, Bld 263 (*)    All other components within normal limits  CBC    EKG EKG Interpretation  Date/Time:  Sunday July 11 2019 12:35:24 EDT Ventricular Rate:  130 PR Interval:    QRS Duration: 88  QT Interval:  210 QTC Calculation: 309 R Axis:   -2 Text Interpretation:  Atrial flutter with variable A-V block Nonspecific ST and T wave abnormality Abnormal ECG Confirmed by Kennis Carina [id number]) on 07/11/2019 2:20:34 PM   Radiology Dg Chest Port 1 View  Result Date: 07/11/2019 CLINICAL DATA:  Pt complains of SOB w/ increased HR and BP beginning early this morning. Pt states he had a cardioversion on Friday. Hx of HTN and diabetes.SHOB EXAM: PORTABLE CHEST 1 VIEW COMPARISON:  None. FINDINGS: Normal mediastinum and cardiac silhouette. Normal pulmonary vasculature. No evidence of effusion, infiltrate, or pneumothorax. No acute bony abnormality. IMPRESSION: No acute cardiopulmonary process. Electronically Signed   By: Genevive Bi M.D.   On: 07/11/2019 14:31    Procedures Procedures (including critical care time)  Medications Ordered in ED Medications  sodium chloride flush (NS) 0.9 % injection 3 mL (3 mLs Intravenous Not Given 07/11/19 1302)  sodium chloride flush (NS) 0.9 % injection 3 mL (has no administration in time range)     Initial Impression / Assessment and Plan / ED Course  I have reviewed the triage vital signs and the nursing notes.  Pertinent labs & imaging results that were available during my care of the patient were reviewed by me and considered in my medical decision making (see chart for details).  Clinical Course as of Jul 10 1521  Sun Jul 11, 2019  [id number] 53 year old male with history of A. fib, cardioversion 2 days ago Modena Jansky with shortness of breath, diaphoresis, heart racing since waking at 5 AM this morning.  Symptoms have improved while in the emergency  room, rate has improved from initially 150 to now 100 on exam.  EKG shows a flutter.  Case discussed with Dr. Pilar Plate who has seen the patient and recommends consult with cardiology.  Case discussed with [facility] Johney Frame with [facility], recommends cardioversion in the emergency room with plan to discharge and follow-up in clinic tomorrow.   [LM]  1520 Discussed plan of care with patient, patient declines cardioversion at this time, states he is feeling better, does not want to cardiovert today as this does not seem to have helped 2 days ago.  Patient prefers to follow-up tomorrow with the A. fib clinic.  He will return to the emergency room for any worsening or concerning symptoms.   [LM]    Clinical Course User Index [LM] Jeannie Fend, PA-C      Final Clinical Impressions(s) / ED Diagnoses   Final diagnoses:  Atrial flutter, unspecified type [facility])    ED Discharge Orders    None       Jeannie Fend, PA-C 07/11/19 1522    Sabas Sous, MD 07/15/19 [phone]

## 2019-07-11 NOTE — ED Triage Notes (Signed)
To ED feeling his heart fluttering. Cardioverted on Friday. States he woke this am at 0830 diaphoretic with fluttering in chest.

## 2019-07-11 NOTE — Telephone Encounter (Signed)
Patient called in reporting elevated HR, hx of Afib with recent DCCV back in 07/09/19. Blood pressures are elevated as well. Currently on Xarelto, Dilt and Toprol. Reports he does feel alittle short of breath but no chest pain or dizziness. Advised he could attempt and extra Toprol. ER precautions given. Instructed I would route to Afib clinic to see if his appt could be moved up from Wednesday to discuss further options. He voiced understanding and thanked me for follow up call.

## 2019-07-11 NOTE — Discharge Instructions (Addendum)
You with your medications as prescribed.  Follow-up with your cardiologist, call the A. fib clinic tomorrow morning for an appointment tomorrow.  Return to the emergency room at anytime for any worsening or concerning symptoms.

## 2019-07-12 ENCOUNTER — Other Ambulatory Visit (HOSPITAL_COMMUNITY): Payer: BC Managed Care – PPO

## 2019-07-12 ENCOUNTER — Other Ambulatory Visit: Payer: Self-pay

## 2019-07-12 ENCOUNTER — Ambulatory Visit (HOSPITAL_COMMUNITY)
Admission: RE | Admit: 2019-07-12 | Discharge: 2019-07-12 | Disposition: A | Discharge status: Home / Self Care | Payer: BC Managed Care – PPO | Source: Ambulatory Visit | Attending: Physician Assistant | Admitting: Physician Assistant

## 2019-07-12 VITALS — BP 136/84 | HR 117 | Ht 75.0 in | Wt 257.0 lb

## 2019-07-12 DIAGNOSIS — I4892 Unspecified atrial flutter: Secondary | ICD-10-CM | POA: Diagnosis not present

## 2019-07-12 DIAGNOSIS — Z833 Family history of diabetes mellitus: Secondary | ICD-10-CM | POA: Insufficient documentation

## 2019-07-12 DIAGNOSIS — Z8249 Family history of ischemic heart disease and other diseases of the circulatory system: Secondary | ICD-10-CM | POA: Insufficient documentation

## 2019-07-12 DIAGNOSIS — E1151 Type 2 diabetes mellitus with diabetic peripheral angiopathy without gangrene: Secondary | ICD-10-CM | POA: Diagnosis not present

## 2019-07-12 DIAGNOSIS — G4733 Obstructive sleep apnea (adult) (pediatric): Secondary | ICD-10-CM | POA: Insufficient documentation

## 2019-07-12 DIAGNOSIS — Z6832 Body mass index (BMI) 32.0-32.9, adult: Secondary | ICD-10-CM | POA: Insufficient documentation

## 2019-07-12 DIAGNOSIS — E669 Obesity, unspecified: Secondary | ICD-10-CM | POA: Diagnosis not present

## 2019-07-12 DIAGNOSIS — R9431 Abnormal electrocardiogram [ECG] [EKG]: Secondary | ICD-10-CM | POA: Diagnosis not present

## 2019-07-12 DIAGNOSIS — Z7984 Long term (current) use of oral hypoglycemic drugs: Secondary | ICD-10-CM | POA: Diagnosis not present

## 2019-07-12 DIAGNOSIS — Z79899 Other long term (current) drug therapy: Secondary | ICD-10-CM | POA: Diagnosis not present

## 2019-07-12 DIAGNOSIS — Z884 Allergy status to anesthetic agent status: Secondary | ICD-10-CM | POA: Insufficient documentation

## 2019-07-12 DIAGNOSIS — Z7901 Long term (current) use of anticoagulants: Secondary | ICD-10-CM | POA: Diagnosis not present

## 2019-07-12 DIAGNOSIS — I10 Essential (primary) hypertension: Secondary | ICD-10-CM | POA: Diagnosis not present

## 2019-07-12 DIAGNOSIS — I483 Typical atrial flutter: Secondary | ICD-10-CM

## 2019-07-12 DIAGNOSIS — Z9119 Patient's noncompliance with other medical treatment and regimen: Secondary | ICD-10-CM | POA: Insufficient documentation

## 2019-07-12 DIAGNOSIS — I428 Other cardiomyopathies: Secondary | ICD-10-CM | POA: Diagnosis not present

## 2019-07-12 DIAGNOSIS — I4819 Other persistent atrial fibrillation: Secondary | ICD-10-CM | POA: Insufficient documentation

## 2019-07-12 MED ORDER — AMIODARONE HCL 200 MG PO TABS: ORAL_TABLET | ORAL | 0 refills | Status: DC

## 2019-07-12 MED ORDER — FUROSEMIDE 20 MG PO TABS: ORAL_TABLET | ORAL | 0 refills | Status: DC

## 2019-07-12 NOTE — Progress Notes (Addendum)
Primary Care Physician: System, Provider Not In Primary Cardiologist: none Primary Electrophysiologist: Dr Johney FrameAllred Referring Physician: [facility]   Denman Charles [name] is a 53 y.o. male with a history of DM, nonischemic CM, OSA, PVD, HTN, HLD, and paroxysmal atrial fibrillation who [facility]. Patient is s/p afib ablation with Dr Johney FrameAllred in 2012 and had not followed up until 08/2018. Patient went to the ER on 06/14/19 with symptoms of SOB, diaphoresis and chest discomfort and was found to be in afib with RVR. He was discharged in rate controlled afib.   He is s/p DCCV on 07/09/19. Unfortunately, he has had ERAF with symptoms of fatigue and SOB. He has gained 6 lbs in 4 days. He denies orthopnea or PND. He remains in atrial flutter today.  Today, he denies symptoms of palpitations, orthopnea, PND, lower extremity edema, dizziness, presyncope, syncope, bleeding, or neurologic sequela. The patient is tolerating medications without difficulties and is otherwise without complaint today.    Atrial Fibrillation Risk Factors:  he does have symptoms or diagnosis of sleep apnea. he is not compliant with CPAP therapy. he does not have a history of rheumatic fever. he does not have a history of alcohol use. The patient does not have a history of early familial atrial fibrillation or other arrhythmias.  he has a BMI of Body mass index is 32.12 kg/m.Marland Kitchen. Filed Weights   07/12/19 1453  Weight: 116.6 kg    Family History  Problem Relation Age of Onset  . Hypertension Mother   . Diabetes Mother   . Alcohol abuse Father   . CAD Father   . Coronary artery disease Sister        with multiple stents  . Coronary artery disease Other        in multiple family members  . Hypertension Brother   . Diabetes Sister   . Diabetes Brother      Atrial Fibrillation Management history:  Previous antiarrhythmic drugs: sotalol  Previous  cardioversions: 2012, 07/09/19 Previous ablations: 2012 CHADS2VASC score: 2 Anticoagulation history: Pradaxa, Xarelto   Past Medical History:  Diagnosis Date  . Atrial fibrillation (HCC)   . Chronic systolic dysfunction of left ventricle   . Diabetes mellitus    A1c of 8% is an improvement from the previous level of 11%  . Hyperlipidemia   . Hypertension   . LVH (left ventricular hypertrophy)    Echo 08/3012-mild LVH  . Nonischemic cardiomyopathy (HCC)    39% - echo on 07/20/2011 -- EF of 45-50% and grade  2 diastolic dysfunction.  . Obesity   . Obstructive sleep apnea    noncompliant with CPAP  . Peripheral vascular disease [facility](HCC)    Past Surgical History:  Procedure Laterality Date  . CARDIAC CATHETERIZATION  10/15/04   no sign. CAD  . CARDIOVERSION  08/09/11   successful  . CARDIOVERSION N/A 07/09/2019   Procedure: CARDIOVERSION;  Surgeon: Pricilla Riffleoss, Paula V, MD;  [facility];  Service: Cardiovascular;  Laterality: N/A;  . RADIOFREQUENCY ABLATION  10/01/2011   PVI for afib by Dr Johney FrameAllred    Current Outpatient Medications  Medication Sig Dispense Refill  . Apple Cider Vinegar 500 MG TABS Take 2 tablets by mouth 2 (two) times a day.    . cetirizine (ZYRTEC) 10 MG tablet Take 10 mg by mouth daily.     Marland Kitchen. diltiazem (CARDIZEM CD) 240 MG 24 hr capsule Take 1 capsule (240 mg  total) by mouth daily. 30 capsule 3  . FARXIGA 10 MG TABS tablet Take 10 mg by mouth daily.   1  . glipiZIDE (GLUCOTROL) 5 MG tablet Take 5 mg by mouth 2 (two) times daily before a meal.     . lisinopril (ZESTRIL) 40 MG tablet Take 40 mg by mouth daily.    . metFORMIN (GLUCOPHAGE-XR) 500 MG 24 hr tablet Take 1,000 mg by mouth 2 (two) times a day.     . metoprolol succinate (TOPROL-XL) 50 MG 24 hr tablet Take 1 tablet (50 mg total) by mouth 2 (two) times a day. 30 tablet 3  . TRULICITY 1.5 HC/6.2BJ SOPN Inject 1.5 mg into the skin once a week. Sundays  6  . XARELTO 20 MG TABS tablet Take 20 mg by mouth  daily.   6  . amiodarone (PACERONE) 200 MG tablet Take 1 tablet by mouth twice a day for 1 month then reduce to 1 tablet daily 60 tablet 0  . furosemide (LASIX) 20 MG tablet Take 1 tablet by mouth daily for 4 days then only as needed for swelling 12 tablet 0   No current facility-administered medications for this encounter.     Allergies  Allergen Reactions  . Procaine Hcl Nausea And Vomiting    Social History   Socioeconomic History  . Marital status: Divorced    Spouse name: Not on file  . Number of children: Not on file  . Years of education: Not on file  . Highest education level: Not on file  Occupational History  . Not on file  Social Needs  . Financial resource strain: Not on file  . Food insecurity    Worry: Not on file    Inability: Not on file  . Transportation needs    Medical: Not on file    Non-medical: Not on file  Tobacco Use  . Smoking status: Never Smoker  . Smokeless tobacco: Never Used  Substance and Sexual Activity  . Alcohol use: Yes    Comment: seldom  . Drug use: No  . Sexual activity: Not on file  Lifestyle  . Physical activity    Days per week: Not on file    Minutes per session: Not on file  . Stress: Not on file  Relationships  . Social Herbalist on phone: Not on file    Gets together: Not on file    Attends religious service: Not on file    Active member of club or organization: Not on file    Attends meetings of clubs or organizations: Not on file    Relationship status: Not on file  . Intimate partner violence    Fear of current or ex partner: Not on file    Emotionally abused: Not on file    Physically abused: Not on file    Forced sexual activity: Not on file  Other Topics Concern  . Not on file  Social History Narrative   Lives in [place] 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,  Textile worker           ROS- All systems are reviewed and negative except as per the HPI above.  Physical Exam: Vitals:   07/12/19 1453  BP:  136/84  Pulse: (!) 117  Weight: 116.6 kg  Height: 6\' 3"  (1.905 m)    GEN- The patient is well appearing obese male, alert and oriented x 3 today.   HEENT-head normocephalic, atraumatic, sclera clear, conjunctiva pink, hearing intact, trachea  midline. Lungs- Clear to ausculation bilaterally, normal work of breathing Heart- irregular rate and rhythm, no murmurs, rubs or gallops  GI- soft, NT, ND, + BS Extremities- no clubbing, cyanosis, or edema MS- no significant deformity or atrophy Skin- no rash or lesion Psych- euthymic mood, full affect Neuro- strength and sensation are intact    Wt Readings from Last 3 Encounters:  07/12/19 116.6 kg  07/09/19 113.6 kg  06/24/19 115.7 kg    EKG today demonstrates typical atrial flutter with variable conduction. QRS 88, Qtc 482  Echo 08/27/18 demonstrated  - Left ventricle: The cavity size was normal. Wall thickness was   increased in a pattern of mild LVH. Systolic function was normal.   The estimated ejection fraction was in the range of 55% to 60%.   Wall motion was normal; there were no regional wall motion   abnormalities. Left ventricular diastolic function parameters   were normal. - Aortic valve: There was mild regurgitation.  Epic records are reviewed at length today  Assessment and Plan:  1. Persistent atrial fibrillation/atrial flutter S/p ablation 2012 with Dr Johney Frame. S/p DCCV 07/09/19 with ERAF. Patient appears to be in persistent afib.  We discussed therapeutic options including AAD therapy and repeat ablation. After discussing the risks and benefits, will start amiodarone 200 mg BID as a bridge to ablation.  Continue diltiazem 240 mg for rate control. Continue Toprol 50 mg BID.  Continue Xarelto 20 mg daily. Will add Lasix 20 mg x4 days Lifestyle changes as below.  This patients CHA2DS2-VASc Score and unadjusted Ischemic Stroke Rate (% per year) is equal to 2.2 % stroke rate/year from a score of 2  Above score  calculated as 1 point each if present [CHF, HTN, DM, Vascular=MI/PAD/Aortic Plaque, Age if 65-74, or Male] Above score calculated as 2 points each if present [Age > 75, or Stroke/TIA/TE]   2. Obesity Body mass index is 32.12 kg/m. Lifestyle modification was discussed and encouraged including regular physical activity and weight reduction.  3. Obstructive sleep apnea The importance of adequate treatment of sleep apnea was discussed today in order to improve our ability to maintain sinus rhythm long term. Sleep study is pending to restart patient on CPAP therapy.   4. HTN Stable, no change today.   Follow up in AF clinic in two weeks.   Jorja Loa PA-C Afib Clinic [facility] [address] [place], [place] [address] [phone] 07/12/2019 3:43 PM

## 2019-07-12 NOTE — Patient Instructions (Signed)
Lasix 20mg  -- 1 tablet daily for the next 4 days then only as needed for weight gain/swelling  Amiodarone 200mg  twice a day for 1 month then reduce to 1 tablet a day - with food

## 2019-07-14 ENCOUNTER — Ambulatory Visit (HOSPITAL_COMMUNITY): Payer: BC Managed Care – PPO | Admitting: Physician Assistant

## 2019-07-14 ENCOUNTER — Encounter (HOSPITAL_COMMUNITY): Payer: Self-pay

## 2019-07-18 ENCOUNTER — Other Ambulatory Visit (HOSPITAL_COMMUNITY): Payer: Self-pay | Admitting: Physician Assistant

## 2019-07-26 ENCOUNTER — Ambulatory Visit (HOSPITAL_COMMUNITY)
Admission: RE | Admit: 2019-07-26 | Discharge: 2019-07-26 | Disposition: A | Discharge status: Home / Self Care | Payer: BC Managed Care – PPO | Source: Ambulatory Visit | Attending: Physician Assistant | Admitting: Physician Assistant

## 2019-07-26 ENCOUNTER — Other Ambulatory Visit: Payer: Self-pay

## 2019-07-26 VITALS — BP 118/76 | HR 87 | Ht 75.0 in | Wt 264.6 lb

## 2019-07-26 DIAGNOSIS — R609 Edema, unspecified: Secondary | ICD-10-CM | POA: Insufficient documentation

## 2019-07-26 DIAGNOSIS — E669 Obesity, unspecified: Secondary | ICD-10-CM | POA: Insufficient documentation

## 2019-07-26 DIAGNOSIS — I4819 Other persistent atrial fibrillation: Secondary | ICD-10-CM | POA: Diagnosis not present

## 2019-07-26 DIAGNOSIS — Z8249 Family history of ischemic heart disease and other diseases of the circulatory system: Secondary | ICD-10-CM | POA: Insufficient documentation

## 2019-07-26 DIAGNOSIS — G4733 Obstructive sleep apnea (adult) (pediatric): Secondary | ICD-10-CM | POA: Diagnosis not present

## 2019-07-26 DIAGNOSIS — Z6833 Body mass index (BMI) 33.0-33.9, adult: Secondary | ICD-10-CM | POA: Diagnosis not present

## 2019-07-26 DIAGNOSIS — E1151 Type 2 diabetes mellitus with diabetic peripheral angiopathy without gangrene: Secondary | ICD-10-CM | POA: Insufficient documentation

## 2019-07-26 DIAGNOSIS — Z7901 Long term (current) use of anticoagulants: Secondary | ICD-10-CM | POA: Insufficient documentation

## 2019-07-26 DIAGNOSIS — I10 Essential (primary) hypertension: Secondary | ICD-10-CM | POA: Diagnosis not present

## 2019-07-26 DIAGNOSIS — Z833 Family history of diabetes mellitus: Secondary | ICD-10-CM | POA: Diagnosis not present

## 2019-07-26 DIAGNOSIS — Z79899 Other long term (current) drug therapy: Secondary | ICD-10-CM | POA: Diagnosis not present

## 2019-07-26 DIAGNOSIS — Z7984 Long term (current) use of oral hypoglycemic drugs: Secondary | ICD-10-CM | POA: Diagnosis not present

## 2019-07-26 DIAGNOSIS — I483 Typical atrial flutter: Secondary | ICD-10-CM | POA: Diagnosis not present

## 2019-07-26 DIAGNOSIS — E785 Hyperlipidemia, unspecified: Secondary | ICD-10-CM | POA: Insufficient documentation

## 2019-07-26 DIAGNOSIS — I4892 Unspecified atrial flutter: Secondary | ICD-10-CM | POA: Diagnosis not present

## 2019-07-26 MED ORDER — POTASSIUM CHLORIDE ER 10 MEQ PO TBCR: 10.0000 meq | EXTENDED_RELEASE_TABLET | Freq: Every day | ORAL | 3 refills | Status: DC

## 2019-07-26 MED ORDER — FUROSEMIDE 20 MG PO TABS: 20.0000 mg | ORAL_TABLET | Freq: Every day | ORAL | 3 refills | Status: DC

## 2019-07-26 NOTE — Progress Notes (Signed)
Primary Care Physician: System, Provider Not In Primary Cardiologist: none Primary Electrophysiologist: Dr Johney FrameAllred Referring Physician: [facility]   Charles [name] is a 53 y.o. male with a history of DM, nonischemic CM, OSA, PVD, HTN, HLD, and paroxysmal atrial fibrillation who presents for follow up [facility]. Patient is s/p afib ablation with Dr Johney FrameAllred in 2012 and had not followed up until 08/2018. Patient went to the ER on 06/14/19 with symptoms of SOB, diaphoresis and chest discomfort and was found to be in afib with RVR. He was discharged in rate controlled afib.   He is s/p DCCV on 07/09/19. Unfortunately, he has had ERAF with symptoms of fatigue and SOB. Patient reports that since starting the amiodarone and lasix he felt much better with less SOB and more energy. He does not today that since he ran out of the lasix, his weight has gone back up and his SOB has returned.   Today, he denies symptoms of palpitations, chest pain, PND, lower extremity edema, dizziness, presyncope, syncope, bleeding, or neurologic sequela. The patient is tolerating medications without difficulties and is otherwise without complaint today.    Atrial Fibrillation Risk Factors:  he does have symptoms or diagnosis of sleep apnea. he is not compliant with CPAP therapy. he does not have a history of rheumatic fever. he does not have a history of alcohol use. The patient does not have a history of early familial atrial fibrillation or other arrhythmias.  he has a BMI of Body mass index is 33.07 kg/m.Marland Kitchen. Filed Weights   07/26/19 1543  Weight: 120 kg    Family History  Problem Relation Age of Onset  . Hypertension Mother   . Diabetes Mother   . Alcohol abuse Father   . CAD Father   . Coronary artery disease Sister        with multiple stents  . Coronary artery disease Other        in multiple family members  . Hypertension Brother   . Diabetes Sister   .  Diabetes Brother      Atrial Fibrillation Management history:  Previous antiarrhythmic drugs: sotalol  Previous cardioversions: 2012, 07/09/19 Previous ablations: 2012 CHADS2VASC score: 2 Anticoagulation history: Pradaxa, Xarelto   Past Medical History:  Diagnosis Date  . Atrial fibrillation (HCC)   . Chronic systolic dysfunction of left ventricle   . Diabetes mellitus    A1c of 8% is an improvement from the previous level of 11%  . Hyperlipidemia   . Hypertension   . LVH (left ventricular hypertrophy)    Echo 08/3012-mild LVH  . Nonischemic cardiomyopathy (HCC)    39% - echo on 07/20/2011 -- EF of 45-50% and grade  2 diastolic dysfunction.  . Obesity   . Obstructive sleep apnea    noncompliant with CPAP  . Peripheral vascular disease [facility](HCC)    Past Surgical History:  Procedure Laterality Date  . CARDIAC CATHETERIZATION  10/15/04   no sign. CAD  . CARDIOVERSION  08/09/11   successful  . CARDIOVERSION N/A 07/09/2019   Procedure: CARDIOVERSION;  Surgeon: Pricilla Riffleoss, Paula V, MD;  [facility];  Service: Cardiovascular;  Laterality: N/A;  . RADIOFREQUENCY ABLATION  10/01/2011   PVI for afib by Dr Johney FrameAllred    Current Outpatient Medications  Medication Sig Dispense Refill  . amiodarone (PACERONE) 200 MG tablet TAKE 1 TABLET BY MOUTH TWICE A DAY FOR 1 MONTH THEN REDUCE TO 1 TABLET DAILY 60 tablet  0  . Apple Cider Vinegar 500 MG TABS Take 2 tablets by mouth 2 (two) times a day.    . cetirizine (ZYRTEC) 10 MG tablet Take 10 mg by mouth daily.     Marland Kitchen diltiazem (CARDIZEM CD) 240 MG 24 hr capsule TAKE 1 CAPSULE BY MOUTH EVERY DAY *STOP AMLODIPINE* 90 capsule 0  . FARXIGA 10 MG TABS tablet Take 10 mg by mouth daily.   1  . furosemide (LASIX) 20 MG tablet Take 1 tablet (20 mg total) by mouth daily. 30 tablet 3  . glipiZIDE (GLUCOTROL) 5 MG tablet Take 5 mg by mouth 2 (two) times daily before a meal.     . lisinopril (ZESTRIL) 40 MG tablet Take 40 mg by mouth daily.    . metFORMIN  (GLUCOPHAGE-XR) 500 MG 24 hr tablet Take 1,000 mg by mouth 2 (two) times a day.     . metoprolol succinate (TOPROL-XL) 50 MG 24 hr tablet Take 1 tablet (50 mg total) by mouth 2 (two) times a day. 30 tablet 3  . TRULICITY 1.5 MG/0.5ML SOPN Inject 1.5 mg into the skin once a week. Sundays  6  . XARELTO 20 MG TABS tablet Take 20 mg by mouth daily.   6  . potassium chloride (K-DUR) 10 MEQ tablet Take 1 tablet (10 mEq total) by mouth daily. 30 tablet 3   No current facility-administered medications for this encounter.     Allergies  Allergen Reactions  . Procaine Hcl Nausea And Vomiting    Social History   Socioeconomic History  . Marital status: Divorced    Spouse name: Not on file  . Number of children: Not on file  . Years of education: Not on file  . Highest education level: Not on file  Occupational History  . Not on file  Social Needs  . Financial resource strain: Not on file  . Food insecurity    Worry: Not on file    Inability: Not on file  . Transportation needs    Medical: Not on file    Non-medical: Not on file  Tobacco Use  . Smoking status: Never Smoker  . Smokeless tobacco: Never Used  Substance and Sexual Activity  . Alcohol use: Yes    Comment: seldom  . Drug use: No  . Sexual activity: Not on file  Lifestyle  . Physical activity    Days per week: Not on file    Minutes per session: Not on file  . Stress: Not on file  Relationships  . Social Musician on phone: Not on file    Gets together: Not on file    Attends religious service: Not on file    Active member of club or organization: Not on file    Attends meetings of clubs or organizations: Not on file    Relationship status: Not on file  . Intimate partner violence    Fear of current or ex partner: Not on file    Emotionally abused: Not on file    Physically abused: Not on file    Forced sexual activity: Not on file  Other Topics Concern  . Not on file  Social History Narrative    Lives in [place] 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,  Textile worker           ROS- All systems are reviewed and negative except as per the HPI above.  Physical Exam: Vitals:   07/26/19 1543  BP: 118/76  Pulse: 87  Weight: 120 kg  Height: 6\' 3"  (1.905 m)   GEN- The patient is well appearing obese male, alert and oriented x 3 today.   HEENT-head normocephalic, atraumatic, sclera clear, conjunctiva pink, hearing intact, trachea midline. Lungs- Clear to ausculation bilaterally, normal work of breathing Heart- irregular rate and rhythm, no murmurs, rubs or gallops  GI- soft, NT, ND, + BS Extremities- no clubbing, cyanosis. Trace bilateral edema MS- no significant deformity or atrophy Skin- no rash or lesion Psych- euthymic mood, full affect Neuro- strength and sensation are intact     Wt Readings from Last 3 Encounters:  07/26/19 120 kg  07/12/19 116.6 kg  07/09/19 113.6 kg    EKG today demonstrates atrial flutter with variable block, HR 87, QRS 98, QTc 454  Echo 08/27/18 demonstrated  - Left ventricle: The cavity size was normal. Wall thickness was   increased in a pattern of mild LVH. Systolic function was normal.   The estimated ejection fraction was in the range of 55% to 60%.   Wall motion was normal; there were no regional wall motion   abnormalities. Left ventricular diastolic function parameters   were normal. - Aortic valve: There was mild regurgitation.  Epic records are reviewed at length today  Assessment and Plan:  1. Persistent atrial fibrillation/atrial flutter S/p ablation 2012 with Dr Rayann Heman. S/p DCCV 07/09/19 with ERAF. Patient remains in atrial flutter today. Continue amiodarone 200 mg BID as a bridge to ablation. Decrease to 200 mg daily in two weeks. Continue diltiazem 240 mg for rate control. Continue Toprol 50 mg BID.  Continue Xarelto 20 mg daily. Lifestyle changes as below.  This patients CHA2DS2-VASc Score and unadjusted Ischemic Stroke Rate (% per  year) is equal to 2.2 % stroke rate/year from a score of 2  Above score calculated as 1 point each if present [CHF, HTN, DM, Vascular=MI/PAD/Aortic Plaque, Age if 65-74, or Male] Above score calculated as 2 points each if present [Age > 75, or Stroke/TIA/TE]   2. Obesity Body mass index is 33.07 kg/m. Lifestyle modification was discussed and encouraged including regular physical activity and weight reduction.  3. Obstructive sleep apnea The importance of adequate treatment of sleep apnea was discussed today in order to improve our ability to maintain sinus rhythm long term. Sleep study is pending to restart patient on CPAP therapy.   4. HTN Stable, no change today.  5. Edema/SOB Patient responded well to Lasix. Will start Lasix 20 mg daily Start K+ 10 meq daily Daily weight 2 g sodium diet.  Hopefully will improve with restoration of SR.   Follow up with Dr Rayann Heman as scheduled.   [facility] [address] [place], [place] [address] [phone] 07/26/2019 4:13 PM

## 2019-08-10 ENCOUNTER — Other Ambulatory Visit (HOSPITAL_COMMUNITY): Payer: Self-pay | Admitting: *Deleted

## 2019-08-10 MED ORDER — METOPROLOL SUCCINATE ER 50 MG PO TB24: 50.0000 mg | ORAL_TABLET | Freq: Two times a day (BID) | ORAL | 6 refills | Status: DC

## 2019-08-18 ENCOUNTER — Ambulatory Visit: Payer: BC Managed Care – PPO | Admitting: Internal Medicine

## 2019-08-26 ENCOUNTER — Telehealth: Payer: Self-pay | Admitting: Internal Medicine

## 2019-08-26 NOTE — Telephone Encounter (Signed)
Pt. Is scheduled for an office visit, however, there is virtual with question marks in the appt. Notes. Please confirm if this appt is an office visit or virtual office visit.

## 2019-08-27 ENCOUNTER — Ambulatory Visit: Payer: BC Managed Care – PPO | Admitting: Internal Medicine

## 2019-08-27 ENCOUNTER — Encounter: Payer: Self-pay | Admitting: Internal Medicine

## 2019-08-27 ENCOUNTER — Telehealth: Payer: Self-pay

## 2019-08-27 ENCOUNTER — Other Ambulatory Visit: Payer: Self-pay

## 2019-08-27 VITALS — BP 136/82 | HR 102 | Ht 75.0 in | Wt 258.0 lb

## 2019-08-27 DIAGNOSIS — I483 Typical atrial flutter: Secondary | ICD-10-CM | POA: Diagnosis not present

## 2019-08-27 DIAGNOSIS — I4819 Other persistent atrial fibrillation: Secondary | ICD-10-CM

## 2019-08-27 DIAGNOSIS — G4730 Sleep apnea, unspecified: Secondary | ICD-10-CM | POA: Diagnosis not present

## 2019-08-27 DIAGNOSIS — I10 Essential (primary) hypertension: Secondary | ICD-10-CM

## 2019-08-27 NOTE — Telephone Encounter (Signed)
Advised Pt he could come in for appt

## 2019-08-27 NOTE — H&P (View-Only) (Signed)
PCP: System, Provider Not In   Primary EP: Dr Rayann Heman  Charles [name] is a 53 y.o. male who presents today for routine electrophysiology followup.  He underwent PVI by me in 2012.  He did amazing until recently.  Over the past few months, he has developed persistent afib/ atrial flutter.  He has fatigue and decreased exercise tolerance.  Today, he denies symptoms of chest pain, shortness of breath,  lower extremity edema, dizziness, presyncope, or syncope.  The patient is otherwise without complaint today.   Past Medical History:  Diagnosis Date  . Atrial fibrillation ([place])   . Chronic systolic dysfunction of left ventricle   . Diabetes mellitus    A1c of 8% is an improvement from the previous level of 11%  . Hyperlipidemia   . Hypertension   . LVH (left ventricular hypertrophy)    Echo 08/3012-mild LVH  . Nonischemic cardiomyopathy ([place])    39% - echo on 07/20/2011 -- EF of 45-50% and grade  2 diastolic dysfunction.  . Obesity   . Obstructive sleep apnea    noncompliant with CPAP  . Peripheral vascular disease [facility])    Past Surgical History:  Procedure Laterality Date  . CARDIAC CATHETERIZATION  10/15/04   no sign. CAD  . CARDIOVERSION  08/09/11   successful  . CARDIOVERSION N/A 07/09/2019   Procedure: CARDIOVERSION;  Surgeon: Fay Records, MD;  Location: [place];  Service: Cardiovascular;  Laterality: N/A;  . RADIOFREQUENCY ABLATION  10/01/2011   PVI for afib by Dr Rayann Heman    ROS- all systems are reviewed and negatives except as per HPI above  Current Outpatient Medications  Medication Sig Dispense Refill  . amiodarone (PACERONE) 200 MG tablet TAKE 1 TABLET BY MOUTH TWICE A DAY FOR 1 MONTH THEN REDUCE TO 1 TABLET DAILY 60 tablet 0  . Apple Cider Vinegar 500 MG TABS Take 2 tablets by mouth 2 (two) times a day.    . cetirizine (ZYRTEC) 10 MG tablet Take 10 mg by mouth daily.     Marland Kitchen diltiazem (CARDIZEM CD) 240 MG 24 hr capsule TAKE 1 CAPSULE BY MOUTH EVERY DAY *STOP  AMLODIPINE* 90 capsule 0  . FARXIGA 10 MG TABS tablet Take 10 mg by mouth daily.   1  . furosemide (LASIX) 20 MG tablet Take 1 tablet (20 mg total) by mouth daily. 30 tablet 3  . glipiZIDE (GLUCOTROL) 5 MG tablet Take 5 mg by mouth 2 (two) times daily before a meal.     . lisinopril (ZESTRIL) 40 MG tablet Take 40 mg by mouth daily.    . metFORMIN (GLUCOPHAGE-XR) 500 MG 24 hr tablet Take 1,000 mg by mouth 2 (two) times a day.     . metoprolol succinate (TOPROL-XL) 50 MG 24 hr tablet Take 1 tablet (50 mg total) by mouth 2 (two) times daily. 60 tablet 6  . potassium chloride (K-DUR) 10 MEQ tablet Take 1 tablet (10 mEq total) by mouth daily. 30 tablet 3  . TRULICITY 1.5 MV/6.7MC SOPN Inject 1.5 mg into the skin once a week. Sundays  6  . XARELTO 20 MG TABS tablet Take 20 mg by mouth daily.   6   No current facility-administered medications for this visit.     Physical Exam: Vitals:   08/27/19 1600  BP: 136/82  Pulse: (!) 102  SpO2: 96%  Weight: 258 lb (117 kg)  [name]: 6\' 3"  (1.905 m)    GEN- The patient is well appearing, alert and  oriented x 3 today.   Head- normocephalic, atraumatic Eyes-  Sclera clear, conjunctiva pink Ears- hearing intact Oropharynx- clear Lungs-  normal work of breathing Heart- tachycardic irregular rhythm GI- soft, NT, ND, + BS Extremities- no clubbing, cyanosis, trace edema  Wt Readings from Last 3 Encounters:  08/27/19 258 lb (117 kg)  07/26/19 264 lb 9.6 oz (120 kg)  07/12/19 257 lb (116.6 kg)    EKG tracing ordered today is personally reviewed and shows somewhat typical appearing atrial flutter vs coarse AF  Assessment and Plan:  1. Persistent atrial fibrillation/ coarse afib The patient has symptomatic, recurrent atrial fibrillation and atrial flutter. he has failed medical therapy with sotalol and amiodarone.  He underwent PVI by me in 2012 and did very well initially following ablation. Chads2vasc score is 4.  he is anticoagulated with xarelto  . Therapeutic strategies for afib including medicine and repeat ablation were discussed in detail with the patient today. Risk, benefits, and alternatives to EP study and radiofrequency ablation for afib were also discussed in detail today. These risks include but are not limited to stroke, bleeding, vascular damage, tamponade, perforation, damage to the esophagus, lungs, and other structures, pulmonary vein stenosis, worsening renal function, and death. The patient understands these risk and wishes to proceed.  We will therefore proceed with catheter ablation at the next available time.  Carto, ICE, anesthesia are requested for the procedure.  Will also obtain cardiac CT prior to the procedure to exclude LAA thrombus and further evaluate atrial anatomy.  2. OSA Compliance with CPAP encouraged  3. HTN Stable No change required today  4. Obesity Lifestyle modification is encouraged  Hillis Range MD, [facility] 08/27/2019 4:16 PM

## 2019-08-27 NOTE — Telephone Encounter (Signed)
Left a message regarding appt on 08/27/19. 

## 2019-08-27 NOTE — Progress Notes (Signed)
PCP: System, Provider Not In   Primary EP: Dr Rayann Heman  Karalee Height is a 53 y.o. male who presents today for routine electrophysiology followup.  He underwent PVI by me in 2012.  He did amazing until recently.  Over the past few months, he has developed persistent afib/ atrial flutter.  He has fatigue and decreased exercise tolerance.  Today, he denies symptoms of chest pain, shortness of breath,  lower extremity edema, dizziness, presyncope, or syncope.  The patient is otherwise without complaint today.   Past Medical History:  Diagnosis Date  . Atrial fibrillation ([place])   . Chronic systolic dysfunction of left ventricle   . Diabetes mellitus    A1c of 8% is an improvement from the previous level of 11%  . Hyperlipidemia   . Hypertension   . LVH (left ventricular hypertrophy)    Echo 08/3012-mild LVH  . Nonischemic cardiomyopathy ([place])    39% - echo on 07/20/2011 -- EF of 45-50% and grade  2 diastolic dysfunction.  . Obesity   . Obstructive sleep apnea    noncompliant with CPAP  . Peripheral vascular disease [facility])    Past Surgical History:  Procedure Laterality Date  . CARDIAC CATHETERIZATION  10/15/04   no sign. CAD  . CARDIOVERSION  08/09/11   successful  . CARDIOVERSION N/A 07/09/2019   Procedure: CARDIOVERSION;  Surgeon: Fay Records, MD;  Location: [place];  Service: Cardiovascular;  Laterality: N/A;  . RADIOFREQUENCY ABLATION  10/01/2011   PVI for afib by Dr Rayann Heman    ROS- all systems are reviewed and negatives except as per HPI above  Current Outpatient Medications  Medication Sig Dispense Refill  . amiodarone (PACERONE) 200 MG tablet TAKE 1 TABLET BY MOUTH TWICE A DAY FOR 1 MONTH THEN REDUCE TO 1 TABLET DAILY 60 tablet 0  . Apple Cider Vinegar 500 MG TABS Take 2 tablets by mouth 2 (two) times a day.    . cetirizine (ZYRTEC) 10 MG tablet Take 10 mg by mouth daily.     Marland Kitchen diltiazem (CARDIZEM CD) 240 MG 24 hr capsule TAKE 1 CAPSULE BY MOUTH EVERY DAY *STOP  AMLODIPINE* 90 capsule 0  . FARXIGA 10 MG TABS tablet Take 10 mg by mouth daily.   1  . furosemide (LASIX) 20 MG tablet Take 1 tablet (20 mg total) by mouth daily. 30 tablet 3  . glipiZIDE (GLUCOTROL) 5 MG tablet Take 5 mg by mouth 2 (two) times daily before a meal.     . lisinopril (ZESTRIL) 40 MG tablet Take 40 mg by mouth daily.    . metFORMIN (GLUCOPHAGE-XR) 500 MG 24 hr tablet Take 1,000 mg by mouth 2 (two) times a day.     . metoprolol succinate (TOPROL-XL) 50 MG 24 hr tablet Take 1 tablet (50 mg total) by mouth 2 (two) times daily. 60 tablet 6  . potassium chloride (K-DUR) 10 MEQ tablet Take 1 tablet (10 mEq total) by mouth daily. 30 tablet 3  . TRULICITY 1.5 MV/6.7MC SOPN Inject 1.5 mg into the skin once a week. Sundays  6  . XARELTO 20 MG TABS tablet Take 20 mg by mouth daily.   6   No current facility-administered medications for this visit.     Physical Exam: Vitals:   08/27/19 1600  BP: 136/82  Pulse: (!) 102  SpO2: 96%  Weight: 258 lb (117 kg)  Height: 6\' 3"  (1.905 m)    GEN- The patient is well appearing, alert and  oriented x 3 today.   Head- normocephalic, atraumatic Eyes-  Sclera clear, conjunctiva pink Ears- hearing intact Oropharynx- clear Lungs-  normal work of breathing Heart- tachycardic irregular rhythm GI- soft, NT, ND, + BS Extremities- no clubbing, cyanosis, trace edema  Wt Readings from Last 3 Encounters:  08/27/19 258 lb (117 kg)  07/26/19 264 lb 9.6 oz (120 kg)  07/12/19 257 lb (116.6 kg)    EKG tracing ordered today is personally reviewed and shows somewhat typical appearing atrial flutter vs coarse AF  Assessment and Plan:  1. Persistent atrial fibrillation/ coarse afib The patient has symptomatic, recurrent atrial fibrillation and atrial flutter. he has failed medical therapy with sotalol and amiodarone.  He underwent PVI by me in 2012 and did very well initially following ablation. Chads2vasc score is 4.  he is anticoagulated with xarelto  . Therapeutic strategies for afib including medicine and repeat ablation were discussed in detail with the patient today. Risk, benefits, and alternatives to EP study and radiofrequency ablation for afib were also discussed in detail today. These risks include but are not limited to stroke, bleeding, vascular damage, tamponade, perforation, damage to the esophagus, lungs, and other structures, pulmonary vein stenosis, worsening renal function, and death. The patient understands these risk and wishes to proceed.  We will therefore proceed with catheter ablation at the next available time.  Carto, ICE, anesthesia are requested for the procedure.  Will also obtain cardiac CT prior to the procedure to exclude LAA thrombus and further evaluate atrial anatomy.  2. OSA Compliance with CPAP encouraged  3. HTN Stable No change required today  4. Obesity Lifestyle modification is encouraged  Sweetie Giebler MD, FACC 08/27/2019 4:16 PM      

## 2019-08-27 NOTE — Patient Instructions (Signed)
Medication Instructions:  Your physician recommends that you continue on your current medications as directed. Please refer to the Current Medication list given to you today.  Labwork: You will get lab work today:  BMP and CBC  Testing/Procedures: None ordered.  Follow-Up: Will be scheduled.  Any Other Special Instructions Will Be Listed Below (If Applicable).  If you need a refill on your cardiac medications before your next appointment, please call your pharmacy.

## 2019-08-28 LAB — CBC WITH DIFFERENTIAL/PLATELET
Basophils Absolute: 0.1 10*3/uL (ref 0.0–0.2)
Basos: 1 %
EOS (ABSOLUTE): 0.1 10*3/uL (ref 0.0–0.4)
Eos: 1 %
Hematocrit: 43.4 % (ref 37.5–51.0)
Hemoglobin: 15.2 g/dL (ref 13.0–17.7)
Immature Grans (Abs): 0 10*3/uL (ref 0.0–0.1)
Immature Granulocytes: 0 %
Lymphocytes Absolute: 3.2 10*3/uL — ABNORMAL HIGH (ref 0.7–3.1)
Lymphs: 40 %
MCH: 31 pg (ref 26.6–33.0)
MCHC: 35 g/dL (ref 31.5–35.7)
MCV: 88 fL (ref 79–97)
Monocytes Absolute: 0.7 10*3/uL (ref 0.1–0.9)
Monocytes: 9 %
Neutrophils Absolute: 3.8 10*3/uL (ref 1.4–7.0)
Neutrophils: 49 %
Platelets: 221 10*3/uL (ref 150–450)
RBC: 4.91 x10E6/uL (ref 4.14–5.80)
RDW: 13.7 % (ref 11.6–15.4)
WBC: 8 10*3/uL (ref 3.4–10.8)

## 2019-08-28 LAB — BASIC METABOLIC PANEL
BUN/Creatinine Ratio: 13 (ref 9–20)
BUN: 14 mg/dL (ref 6–24)
CO2: 25 mmol/L (ref 20–29)
Calcium: 10 mg/dL (ref 8.7–10.2)
Chloride: 104 mmol/L (ref 96–106)
Creatinine, Ser: 1.08 mg/dL (ref 0.76–1.27)
GFR calc Af Amer: 89 mL/min/{1.73_m2} (ref >59)
GFR calc non Af Amer: 77 mL/min/{1.73_m2} (ref >59)
Glucose: 119 mg/dL — ABNORMAL HIGH (ref 65–99)
Potassium: 3.9 mmol/L (ref 3.5–5.2)
Sodium: 145 mmol/L — ABNORMAL HIGH (ref 134–144)

## 2019-09-02 ENCOUNTER — Telehealth (HOSPITAL_COMMUNITY): Payer: Self-pay | Admitting: Emergency Medicine

## 2019-09-02 NOTE — Telephone Encounter (Signed)
Left message on voicemail with name and callback number Oswell Say RN Navigator Cardiac Imaging [facility] [phone] Office [phone] Cell  

## 2019-09-06 ENCOUNTER — Other Ambulatory Visit: Payer: Self-pay

## 2019-09-06 ENCOUNTER — Other Ambulatory Visit (HOSPITAL_COMMUNITY): Payer: BC Managed Care – PPO

## 2019-09-06 ENCOUNTER — Other Ambulatory Visit (HOSPITAL_COMMUNITY)
Admission: RE | Admit: 2019-09-06 | Discharge: 2019-09-06 | Disposition: A | Discharge status: Home / Self Care | Payer: BC Managed Care – PPO | Source: Ambulatory Visit | Attending: Internal Medicine | Admitting: Internal Medicine

## 2019-09-06 ENCOUNTER — Ambulatory Visit (HOSPITAL_COMMUNITY)
Admission: RE | Admit: 2019-09-06 | Discharge: 2019-09-06 | Disposition: A | Discharge status: Home / Self Care | Payer: BC Managed Care – PPO | Source: Ambulatory Visit | Attending: Internal Medicine | Admitting: Internal Medicine

## 2019-09-06 DIAGNOSIS — Z20828 Contact with and (suspected) exposure to other viral communicable diseases: Secondary | ICD-10-CM | POA: Diagnosis not present

## 2019-09-06 DIAGNOSIS — I4819 Other persistent atrial fibrillation: Secondary | ICD-10-CM

## 2019-09-06 MED ORDER — METOPROLOL TARTRATE 5 MG/5ML IV SOLN
5.0000 mg | INTRAVENOUS | Status: DC | PRN
  Administered 2019-09-06 (×4): 5 mg via INTRAVENOUS
  Filled 2019-09-06 (×4): qty 5

## 2019-09-06 MED ORDER — IOHEXOL 350 MG/ML SOLN
80.0000 mL | Freq: Once | INTRAVENOUS | Status: AC | PRN
  Administered 2019-09-06: 80 mL via INTRAVENOUS

## 2019-09-06 MED ORDER — METOPROLOL TARTRATE 5 MG/5ML IV SOLN
INTRAVENOUS | Status: AC
  Filled 2019-09-06: qty 20

## 2019-09-07 ENCOUNTER — Other Ambulatory Visit (HOSPITAL_COMMUNITY): Payer: Self-pay | Admitting: Physician Assistant

## 2019-09-07 LAB — NOVEL CORONAVIRUS, NAA (HOSP ORDER, SEND-OUT TO REF LAB; TAT 18-24 HRS): SARS-CoV-2, NAA: NOT DETECTED

## 2019-09-08 NOTE — Anesthesia Preprocedure Evaluation (Addendum)
Anesthesia Evaluation  Patient identified by MRN, date of birth, ID band Patient awake    Reviewed: Allergy & Precautions, NPO status , Patient's Chart, lab work & pertinent test results  History of Anesthesia Complications Negative for: history of anesthetic complications  Airway Mallampati: II  TM Distance: >3 FB Neck ROM: Full    Dental  (+) Dental Advisory Given, Teeth Intact   Pulmonary sleep apnea and Continuous Positive Airway Pressure Ventilation ,    breath sounds clear to auscultation       Cardiovascular hypertension, Pt. on medications and Pt. on home beta blockers (-) angina+ Peripheral Vascular Disease  + dysrhythmias Atrial Fibrillation  Rhythm:Irregular Rate:Normal   '19 TTE - mild LVH. EF 55% to 60%. Mild AI.    Neuro/Psych negative neurological ROS  negative psych ROS   GI/Hepatic negative GI ROS, Neg liver ROS,   Endo/Other  diabetes, Type 2, Oral Hypoglycemic Agents Obesity   Renal/GU negative Renal ROS     Musculoskeletal negative musculoskeletal ROS (+)   Abdominal   Peds  Hematology negative hematology ROS (+)   Anesthesia Other Findings   Reproductive/Obstetrics                            Anesthesia Physical Anesthesia Plan  ASA: III  Anesthesia Plan: General   Post-op Pain Management:    Induction: Intravenous  PONV Risk Score and Plan: 2 and Treatment may vary due to age or medical condition, Ondansetron, Dexamethasone and Midazolam  Airway Management Planned: Oral ETT  Additional Equipment: None  Intra-op Plan:   Post-operative Plan: Extubation in OR  Informed Consent: I have reviewed the patients History and Physical, chart, labs and discussed the procedure including the risks, benefits and alternatives for the proposed anesthesia with the patient or authorized representative who has indicated his/her understanding and acceptance.     Dental  advisory given  Plan Discussed with: CRNA and Anesthesiologist  Anesthesia Plan Comments:        Anesthesia Quick Evaluation

## 2019-09-09 ENCOUNTER — Ambulatory Visit (HOSPITAL_COMMUNITY)
Admission: RE | Admit: 2019-09-09 | Discharge: 2019-09-09 | Disposition: A | Discharge status: Home / Self Care | Payer: BC Managed Care – PPO | Attending: Internal Medicine | Admitting: Internal Medicine

## 2019-09-09 ENCOUNTER — Ambulatory Visit (HOSPITAL_COMMUNITY): Payer: BC Managed Care – PPO | Admitting: Anesthesiology

## 2019-09-09 ENCOUNTER — Encounter (HOSPITAL_COMMUNITY)
Admission: RE | Disposition: A | Discharge status: Home / Self Care | Payer: Self-pay | Source: Home / Self Care | Attending: Internal Medicine

## 2019-09-09 ENCOUNTER — Other Ambulatory Visit: Payer: Self-pay

## 2019-09-09 DIAGNOSIS — Z6832 Body mass index (BMI) 32.0-32.9, adult: Secondary | ICD-10-CM | POA: Diagnosis not present

## 2019-09-09 DIAGNOSIS — E669 Obesity, unspecified: Secondary | ICD-10-CM | POA: Insufficient documentation

## 2019-09-09 DIAGNOSIS — I428 Other cardiomyopathies: Secondary | ICD-10-CM | POA: Diagnosis not present

## 2019-09-09 DIAGNOSIS — I739 Peripheral vascular disease, unspecified: Secondary | ICD-10-CM | POA: Diagnosis not present

## 2019-09-09 DIAGNOSIS — Z9119 Patient's noncompliance with other medical treatment and regimen: Secondary | ICD-10-CM | POA: Diagnosis not present

## 2019-09-09 DIAGNOSIS — I5022 Chronic systolic (congestive) heart failure: Secondary | ICD-10-CM | POA: Diagnosis not present

## 2019-09-09 DIAGNOSIS — Z7901 Long term (current) use of anticoagulants: Secondary | ICD-10-CM | POA: Insufficient documentation

## 2019-09-09 DIAGNOSIS — G4733 Obstructive sleep apnea (adult) (pediatric): Secondary | ICD-10-CM | POA: Insufficient documentation

## 2019-09-09 DIAGNOSIS — Z79899 Other long term (current) drug therapy: Secondary | ICD-10-CM | POA: Diagnosis not present

## 2019-09-09 DIAGNOSIS — I483 Typical atrial flutter: Secondary | ICD-10-CM | POA: Insufficient documentation

## 2019-09-09 DIAGNOSIS — Z7984 Long term (current) use of oral hypoglycemic drugs: Secondary | ICD-10-CM | POA: Insufficient documentation

## 2019-09-09 DIAGNOSIS — I119 Hypertensive heart disease without heart failure: Secondary | ICD-10-CM | POA: Diagnosis not present

## 2019-09-09 DIAGNOSIS — I110 Hypertensive heart disease with heart failure: Secondary | ICD-10-CM | POA: Insufficient documentation

## 2019-09-09 DIAGNOSIS — I519 Heart disease, unspecified: Secondary | ICD-10-CM | POA: Diagnosis not present

## 2019-09-09 DIAGNOSIS — E119 Type 2 diabetes mellitus without complications: Secondary | ICD-10-CM | POA: Diagnosis not present

## 2019-09-09 DIAGNOSIS — I480 Paroxysmal atrial fibrillation: Secondary | ICD-10-CM | POA: Insufficient documentation

## 2019-09-09 DIAGNOSIS — E785 Hyperlipidemia, unspecified: Secondary | ICD-10-CM | POA: Diagnosis not present

## 2019-09-09 HISTORY — PX: ATRIAL FIBRILLATION ABLATION: EP1191

## 2019-09-09 LAB — GLUCOSE, CAPILLARY
Glucose-Capillary: 188 mg/dL — ABNORMAL HIGH (ref 70–99)
Glucose-Capillary: 148 mg/dL — ABNORMAL HIGH (ref 70–99)

## 2019-09-09 SURGERY — ATRIAL FIBRILLATION ABLATION
Anesthesia: General

## 2019-09-09 MED ORDER — ACETAMINOPHEN 325 MG PO TABS
650.0000 mg | ORAL_TABLET | ORAL | Status: DC | PRN
  Administered 2019-09-09: 650 mg via ORAL
  Filled 2019-09-09: qty 2

## 2019-09-09 MED ORDER — ISOPROTERENOL HCL 0.2 MG/ML IJ SOLN
INTRAMUSCULAR | Status: AC
  Filled 2019-09-09: qty 5

## 2019-09-09 MED ORDER — SODIUM CHLORIDE 0.9 % IV SOLN
INTRAVENOUS | Status: DC
  Administered 2019-09-09: 06:00:00 via INTRAVENOUS

## 2019-09-09 MED ORDER — BUPIVACAINE HCL (PF) 0.25 % IJ SOLN
INTRAMUSCULAR | Status: DC | PRN
  Administered 2019-09-09: 30 mL

## 2019-09-09 MED ORDER — ISOPROTERENOL HCL 0.2 MG/ML IJ SOLN
INTRAVENOUS | Status: DC | PRN
  Administered 2019-09-09: 20 ug/min via INTRAVENOUS

## 2019-09-09 MED ORDER — PROPOFOL 10 MG/ML IV BOLUS
INTRAVENOUS | Status: DC | PRN
  Administered 2019-09-09: 200 mg via INTRAVENOUS

## 2019-09-09 MED ORDER — FENTANYL CITRATE (PF) 100 MCG/2ML IJ SOLN: 25.0000 ug | INTRAMUSCULAR | Status: DC | PRN

## 2019-09-09 MED ORDER — ROCURONIUM BROMIDE 10 MG/ML (PF) SYRINGE
PREFILLED_SYRINGE | INTRAVENOUS | Status: DC | PRN
  Administered 2019-09-09: 20 mg via INTRAVENOUS
  Administered 2019-09-09: 60 mg via INTRAVENOUS

## 2019-09-09 MED ORDER — PROTAMINE SULFATE 10 MG/ML IV SOLN
INTRAVENOUS | Status: DC | PRN
  Administered 2019-09-09: 40 mg via INTRAVENOUS

## 2019-09-09 MED ORDER — HEPARIN (PORCINE) IN NACL 1000-0.9 UT/500ML-% IV SOLN
INTRAVENOUS | Status: DC | PRN
  Administered 2019-09-09: 500 mL

## 2019-09-09 MED ORDER — SODIUM CHLORIDE 0.9 % IV SOLN: 250.0000 mL | INTRAVENOUS | Status: DC | PRN

## 2019-09-09 MED ORDER — HYDROCODONE-ACETAMINOPHEN 5-325 MG PO TABS: 1.0000 | ORAL_TABLET | ORAL | Status: DC | PRN

## 2019-09-09 MED ORDER — HEPARIN SODIUM (PORCINE) 1000 UNIT/ML IJ SOLN
INTRAMUSCULAR | Status: DC | PRN
  Administered 2019-09-09: 5000 [IU] via INTRAVENOUS

## 2019-09-09 MED ORDER — ONDANSETRON HCL 4 MG/2ML IJ SOLN
4.0000 mg | Freq: Four times a day (QID) | INTRAMUSCULAR | Status: DC | PRN
  Administered 2019-09-09: 4 mg via INTRAVENOUS

## 2019-09-09 MED ORDER — PROMETHAZINE HCL 25 MG/ML IJ SOLN: 6.2500 mg | INTRAMUSCULAR | Status: DC | PRN

## 2019-09-09 MED ORDER — OXYCODONE HCL 5 MG/5ML PO SOLN: 5.0000 mg | Freq: Once | ORAL | Status: DC | PRN

## 2019-09-09 MED ORDER — HEPARIN (PORCINE) IN NACL 1000-0.9 UT/500ML-% IV SOLN
INTRAVENOUS | Status: AC
  Filled 2019-09-09: qty 500

## 2019-09-09 MED ORDER — SODIUM CHLORIDE 0.9% FLUSH: 3.0000 mL | INTRAVENOUS | Status: DC | PRN

## 2019-09-09 MED ORDER — BUPIVACAINE HCL (PF) 0.25 % IJ SOLN
INTRAMUSCULAR | Status: AC
  Filled 2019-09-09: qty 30

## 2019-09-09 MED ORDER — ONDANSETRON HCL 4 MG/2ML IJ SOLN
INTRAMUSCULAR | Status: DC | PRN
  Administered 2019-09-09: 4 mg via INTRAVENOUS

## 2019-09-09 MED ORDER — OXYCODONE HCL 5 MG PO TABS: 5.0000 mg | ORAL_TABLET | Freq: Once | ORAL | Status: DC | PRN

## 2019-09-09 MED ORDER — HEPARIN SODIUM (PORCINE) 1000 UNIT/ML IJ SOLN
INTRAMUSCULAR | Status: DC | PRN
  Administered 2019-09-09: 14000 [IU]
  Administered 2019-09-09: 1000 [IU] via INTRAVENOUS

## 2019-09-09 MED ORDER — SODIUM CHLORIDE 0.9% FLUSH: 3.0000 mL | Freq: Two times a day (BID) | INTRAVENOUS | Status: DC

## 2019-09-09 MED ORDER — HEPARIN SODIUM (PORCINE) 1000 UNIT/ML IJ SOLN
INTRAMUSCULAR | Status: AC
  Filled 2019-09-09: qty 2

## 2019-09-09 MED ORDER — DEXAMETHASONE SODIUM PHOSPHATE 10 MG/ML IJ SOLN
INTRAMUSCULAR | Status: DC | PRN
  Administered 2019-09-09: 4 mg via INTRAVENOUS

## 2019-09-09 MED ORDER — SODIUM CHLORIDE 0.9 % IV SOLN
INTRAVENOUS | Status: DC | PRN
  Administered 2019-09-09: 20 ug/min via INTRAVENOUS

## 2019-09-09 MED ORDER — ONDANSETRON HCL 4 MG/2ML IJ SOLN
INTRAMUSCULAR | Status: AC
  Filled 2019-09-09: qty 2

## 2019-09-09 MED ORDER — PANTOPRAZOLE SODIUM 40 MG PO TBEC: 40.0000 mg | DELAYED_RELEASE_TABLET | Freq: Every day | ORAL | 0 refills | Status: DC

## 2019-09-09 MED ORDER — MIDAZOLAM HCL 2 MG/2ML IJ SOLN
INTRAMUSCULAR | Status: DC | PRN
  Administered 2019-09-09: 2 mg via INTRAVENOUS

## 2019-09-09 MED ORDER — RIVAROXABAN 20 MG PO TABS
20.0000 mg | ORAL_TABLET | Freq: Once | ORAL | Status: AC
  Administered 2019-09-09: 11:00:00 20 mg via ORAL
  Filled 2019-09-09: qty 1

## 2019-09-09 MED ORDER — LACTATED RINGERS IV SOLN
INTRAVENOUS | Status: DC | PRN
  Administered 2019-09-09: 08:00:00 via INTRAVENOUS

## 2019-09-09 MED ORDER — LIDOCAINE 2% (20 MG/ML) 5 ML SYRINGE
INTRAMUSCULAR | Status: DC | PRN
  Administered 2019-09-09: 80 mg via INTRAVENOUS

## 2019-09-09 MED ORDER — FENTANYL CITRATE (PF) 100 MCG/2ML IJ SOLN
INTRAMUSCULAR | Status: DC | PRN
  Administered 2019-09-09: 100 ug via INTRAVENOUS

## 2019-09-09 MED ORDER — SUGAMMADEX SODIUM 200 MG/2ML IV SOLN
INTRAVENOUS | Status: DC | PRN
  Administered 2019-09-09: 200 mg via INTRAVENOUS

## 2019-09-09 SURGICAL SUPPLY — 18 items
BLANKET WARM UNDERBOD FULL ACC (MISCELLANEOUS) ×2 IMPLANT
CATH MAPPNG PENTARAY F 2-6-2MM (CATHETERS) IMPLANT
CATH SMTCH THERMOCOOL SF DF (CATHETERS) ×1 IMPLANT
CATH SOUNDSTAR ECO REPROCESSED (CATHETERS) ×2 IMPLANT
CATH WEBSTER BI DIR CS D-F CRV (CATHETERS) ×1 IMPLANT
COVER SWIFTLINK CONNECTOR (BAG) ×2 IMPLANT
NDL BAYLIS TRANSSEPTAL 71CM (NEEDLE) IMPLANT
NEEDLE BAYLIS TRANSSEPTAL 71CM (NEEDLE) ×2 IMPLANT
PACK EP LATEX FREE (CUSTOM PROCEDURE TRAY) ×2
PACK EP LF (CUSTOM PROCEDURE TRAY) ×1 IMPLANT
PAD PRO RADIOLUCENT 2001M-C (PAD) ×2 IMPLANT
PATCH CARTO3 (PAD) ×1 IMPLANT
PENTARAY F 2-6-2MM (CATHETERS) ×2
SHEATH AVANTI 11F 11CM (SHEATH) ×1 IMPLANT
SHEATH PINNACLE 7F 10CM (SHEATH) ×2 IMPLANT
SHEATH PROBE COVER 6X72 (BAG) ×1 IMPLANT
SHEATH SWARTZ TS SL2 63CM 8.5F (SHEATH) ×1 IMPLANT
TUBING SMART ABLATE COOLFLOW (TUBING) ×1 IMPLANT

## 2019-09-09 NOTE — Interval H&P Note (Signed)
History and Physical Interval Note:  09/09/2019 7:28 AM  Charles [name]  has presented today for surgery, with the diagnosis of Atrial Fibrillation.  The various methods of treatment have been discussed with the patient and family. After consideration of risks, benefits and other options for treatment, the patient has consented to  Procedure(s): ATRIAL FIBRILLATION ABLATION (N/A) as a surgical intervention.  The patient's history has been reviewed, patient examined, no change in status, stable for surgery.  I have reviewed the patient's chart and labs.  Questions were answered to the patient's satisfaction.    Cardiac CT reviewed with patient. He reports compliance with xarelto without interruption.  Thompson Grayer

## 2019-09-09 NOTE — Progress Notes (Signed)
Report and Care transferred to [facility]. Patient resting in bed at this time. Rt groin remains stable.

## 2019-09-09 NOTE — Progress Notes (Signed)
Report and care received from [facility]. Rt groin remains stable with no bleeding or hematoma noted. Patient resting in bed with no complaints at this time. Will continue to monitor patient.

## 2019-09-09 NOTE — Anesthesia Postprocedure Evaluation (Signed)
Anesthesia Post Note  Patient: Charles [name]  Procedure(s) Performed: ATRIAL FIBRILLATION ABLATION (N/A )     Patient location during evaluation: PACU Anesthesia Type: General Level of consciousness: awake and alert Pain management: pain level controlled Vital Signs Assessment: post-procedure vital signs reviewed and stable Respiratory status: spontaneous breathing, nonlabored ventilation, respiratory function stable and patient connected to nasal cannula oxygen Cardiovascular status: blood pressure returned to baseline and stable Postop Assessment: no apparent nausea or vomiting Anesthetic complications: no    Last Vitals:  Vitals:   09/09/19 1050 09/09/19 1120  BP:  (!) 134/94  Pulse:  93  Resp:  (!) 23  Temp: (!) 36.2 C   SpO2:  97%    Last Pain:  Vitals:   09/09/19 1120  TempSrc:   PainSc: 0-No pain                 Audry Pili

## 2019-09-09 NOTE — Progress Notes (Signed)
Figure 8 Stitch removed. Site CDI.  Level 0. Patient states no pain and stable.  Tegaderm and Gauze dressing applied.

## 2019-09-09 NOTE — Transfer of Care (Signed)
Immediate Anesthesia Transfer of Care Note  Patient: Charles [name]  Procedure(s) Performed: ATRIAL FIBRILLATION ABLATION (N/A )  Patient Location: Cath Lab  Anesthesia Type:General  Level of Consciousness: awake, alert  and oriented  Airway & Oxygen Therapy: Patient Spontanous Breathing and Patient connected to nasal cannula oxygen  Post-op Assessment: Report given to RN, Post -op Vital signs reviewed and stable and Patient moving all extremities X 4  Post vital signs: Reviewed and stable  Last Vitals:  Vitals Value Taken Time  BP 137/93 09/09/19 1020  Temp 36.1 C 09/09/19 1018  Pulse 88 09/09/19 1020  Resp 19 09/09/19 1020  SpO2 94 % 09/09/19 1020    Last Pain:  Vitals:   09/09/19 1018  TempSrc: Temporal  PainSc: 0-No pain      Patients Stated Pain Goal: 3 (62/[id number])  Complications: No apparent anesthesia complications

## 2019-09-09 NOTE — Anesthesia Procedure Notes (Signed)
Procedure Name: Intubation Date/Time: 09/09/2019 7:50 AM Performed by: Leonor Liv, CRNA Pre-anesthesia Checklist: Patient identified, Emergency Drugs available, Suction available and Patient being monitored Patient Re-evaluated:Patient Re-evaluated prior to induction Oxygen Delivery Method: Circle System Utilized Preoxygenation: Pre-oxygenation with 100% oxygen Induction Type: IV induction Ventilation: Mask ventilation without difficulty Laryngoscope Size: Mac and 4 Grade View: Grade I Tube type: Oral Tube size: 7.5 mm Number of attempts: 1 Airway Equipment and Method: Stylet and Oral airway Placement Confirmation: ETT inserted through vocal cords under direct vision,  positive ETCO2 and breath sounds checked- equal and bilateral Secured at: 22 cm Tube secured with: Tape Dental Injury: Teeth and Oropharynx as per pre-operative assessment

## 2019-09-09 NOTE — Discharge Instructions (Signed)
Post procedure care instructions No driving for 4 days. No lifting over 5 lbs for 1 week. No vigorours or sexual activity for 1 week. You may return to work on 09/16/2019. Keep procedure site clean & dry. If you notice increased pain, swelling, bleeding or pus, call/return!  You may shower, but no soaking baths/hot tubs/pools for 1 week.     You have an appointment set up with the [facility].  Multiple studies have shown that being followed by a dedicated atrial fibrillation clinic in addition to the standard care you receive from your other physicians improves health. We believe that enrollment in the atrial fibrillation clinic will allow [place] to better care for you.   The phone number to the [facility] is [phone]. The clinic is staffed Monday through Friday from 8:30am to 5pm.  Parking Directions: The clinic is located in the Heart and Vascular Building connected to [facility]. 1)From [address] turn on to [address] and go to the 3rd entrance  (Heart and Vascular entrance) on the right. 2)Look to the right for Heart &Vascular Parking Garage. 3)A code for the entrance is required please call the clinic to receive this.   4)Take the elevators to the 1st floor. Registration is in the room with the glass walls at the end of the hallway.  If you have any trouble parking or locating the clinic, please dont hesitate to call [phone].

## 2019-09-10 ENCOUNTER — Encounter (HOSPITAL_COMMUNITY): Payer: Self-pay | Admitting: Internal Medicine

## 2019-09-14 LAB — POCT ACTIVATED CLOTTING TIME: Activated Clotting Time: 241 seconds

## 2019-09-30 DIAGNOSIS — K429 Umbilical hernia without obstruction or gangrene: Secondary | ICD-10-CM | POA: Diagnosis not present

## 2019-09-30 DIAGNOSIS — I10 Essential (primary) hypertension: Secondary | ICD-10-CM | POA: Diagnosis not present

## 2019-09-30 DIAGNOSIS — G4733 Obstructive sleep apnea (adult) (pediatric): Secondary | ICD-10-CM | POA: Diagnosis not present

## 2019-09-30 DIAGNOSIS — E6609 Other obesity due to excess calories: Secondary | ICD-10-CM | POA: Diagnosis not present

## 2019-09-30 DIAGNOSIS — E782 Mixed hyperlipidemia: Secondary | ICD-10-CM | POA: Diagnosis not present

## 2019-09-30 DIAGNOSIS — Z0001 Encounter for general adult medical examination with abnormal findings: Secondary | ICD-10-CM | POA: Diagnosis not present

## 2019-09-30 DIAGNOSIS — Z23 Encounter for immunization: Secondary | ICD-10-CM | POA: Diagnosis not present

## 2019-09-30 DIAGNOSIS — I4891 Unspecified atrial fibrillation: Secondary | ICD-10-CM | POA: Diagnosis not present

## 2019-09-30 DIAGNOSIS — Z1212 Encounter for screening for malignant neoplasm of rectum: Secondary | ICD-10-CM | POA: Diagnosis not present

## 2019-09-30 DIAGNOSIS — E1165 Type 2 diabetes mellitus with hyperglycemia: Secondary | ICD-10-CM | POA: Diagnosis not present

## 2019-10-06 ENCOUNTER — Other Ambulatory Visit: Payer: Self-pay

## 2019-10-06 ENCOUNTER — Ambulatory Visit (HOSPITAL_COMMUNITY)
Admission: RE | Admit: 2019-10-06 | Discharge: 2019-10-06 | Disposition: A | Discharge status: Home / Self Care | Payer: BC Managed Care – PPO | Source: Ambulatory Visit | Attending: Nurse Practitioner | Admitting: Nurse Practitioner

## 2019-10-06 ENCOUNTER — Encounter (HOSPITAL_COMMUNITY): Payer: Self-pay | Admitting: Nurse Practitioner

## 2019-10-06 VITALS — BP 136/82 | HR 88 | Ht 75.0 in | Wt 258.6 lb

## 2019-10-06 DIAGNOSIS — I4819 Other persistent atrial fibrillation: Secondary | ICD-10-CM

## 2019-10-06 DIAGNOSIS — Z79899 Other long term (current) drug therapy: Secondary | ICD-10-CM | POA: Diagnosis not present

## 2019-10-06 DIAGNOSIS — Z7901 Long term (current) use of anticoagulants: Secondary | ICD-10-CM | POA: Diagnosis not present

## 2019-10-06 DIAGNOSIS — I5022 Chronic systolic (congestive) heart failure: Secondary | ICD-10-CM | POA: Insufficient documentation

## 2019-10-06 DIAGNOSIS — Z7984 Long term (current) use of oral hypoglycemic drugs: Secondary | ICD-10-CM | POA: Diagnosis not present

## 2019-10-06 DIAGNOSIS — G4733 Obstructive sleep apnea (adult) (pediatric): Secondary | ICD-10-CM | POA: Diagnosis not present

## 2019-10-06 DIAGNOSIS — I110 Hypertensive heart disease with heart failure: Secondary | ICD-10-CM | POA: Insufficient documentation

## 2019-10-06 DIAGNOSIS — E118 Type 2 diabetes mellitus with unspecified complications: Secondary | ICD-10-CM | POA: Diagnosis not present

## 2019-10-06 DIAGNOSIS — I4891 Unspecified atrial fibrillation: Secondary | ICD-10-CM | POA: Insufficient documentation

## 2019-10-06 DIAGNOSIS — I428 Other cardiomyopathies: Secondary | ICD-10-CM | POA: Insufficient documentation

## 2019-10-06 DIAGNOSIS — I739 Peripheral vascular disease, unspecified: Secondary | ICD-10-CM | POA: Insufficient documentation

## 2019-10-06 DIAGNOSIS — E785 Hyperlipidemia, unspecified: Secondary | ICD-10-CM | POA: Insufficient documentation

## 2019-10-06 NOTE — Patient Instructions (Signed)
Lasix and potassium Every other day for a week then stop - monitor weights daily.

## 2019-10-06 NOTE — Progress Notes (Signed)
Primary Care Physician: Default, Provider, MD Referring Physician: Dr. [name] is a 53 y.o. male with a h/o afib s/p ablation, one month ago, in the afib clinic for f/u. He has not noted any afib. He did develop rash from  pantoprazole and stopped after 2 days of taking with rash quickly fading. He has not had any swallowing or groin difficulties following the procedure. Amiodarone and diltiazem was stopped after ablation by Dr. Rayann [name]. Pt is wanting to come off laisx and K+ as fluid has not been as issue since pt has been saying in SR.   Today, he denies symptoms of palpitations, chest pain, shortness of breath, orthopnea, PND, lower extremity edema, dizziness, presyncope, syncope, or neurologic sequela. The patient is tolerating medications without difficulties and is otherwise without complaint today.   Past Medical History:  Diagnosis Date  . Atrial fibrillation ([place])   . Chronic systolic dysfunction of left ventricle   . Diabetes mellitus    A1c of 8% is an improvement from the previous level of 11%  . Hyperlipidemia   . Hypertension   . LVH (left ventricular hypertrophy)    Echo 08/3012-mild LVH  . Nonischemic cardiomyopathy ([place])    39% - echo on 07/20/2011 -- EF of 45-50% and grade  2 diastolic dysfunction.  . Obesity   . Obstructive sleep apnea    noncompliant with CPAP  . Peripheral vascular disease [facility])    Past Surgical History:  Procedure Laterality Date  . ATRIAL FIBRILLATION ABLATION N/A 09/09/2019   Procedure: ATRIAL FIBRILLATION ABLATION;  Surgeon: Charles Grayer, MD;  Location: [place] CV LAB;  Service: Cardiovascular;  Laterality: N/A;  . CARDIAC CATHETERIZATION  10/15/04   no sign. CAD  . CARDIOVERSION  08/09/11   successful  . CARDIOVERSION N/A 07/09/2019   Procedure: CARDIOVERSION;  Surgeon: Charles Records, MD;  Location: [place];  Service: Cardiovascular;  Laterality: N/A;  . RADIOFREQUENCY ABLATION  10/01/2011   PVI for afib by Dr  Charles [name]    Current Outpatient Medications  Medication Sig Dispense Refill  . APPLE CIDER VINEGAR PO Take 450-900 mg by mouth See admin instructions. Take 2 tablets (900 mg) by mouth in the morning & 1 tablet (450 mg) by mouth in the evening.    . cetirizine (ZYRTEC) 10 MG tablet Take 10 mg by mouth daily.     Marland Kitchen FARXIGA 10 MG TABS tablet Take 10 mg by mouth daily.   1  . furosemide (LASIX) 20 MG tablet TAKE 1 TABLET BY MOUTH EVERY DAY 90 tablet 1  . glipiZIDE (GLUCOTROL) 5 MG tablet Take 5 mg by mouth 2 (two) times daily before a meal.     . lisinopril (ZESTRIL) 40 MG tablet Take 40 mg by mouth daily.    . metFORMIN (GLUCOPHAGE-XR) 500 MG 24 hr tablet Take 1,000 mg by mouth 2 (two) times a day.     . metoprolol succinate (TOPROL-XL) 50 MG 24 hr tablet Take 1 tablet (50 mg total) by mouth 2 (two) times daily. 60 tablet 6  . pantoprazole (PROTONIX) 40 MG tablet Take 1 tablet (40 mg total) by mouth daily. 45 tablet 0  . TRULICITY 1.5 ZD/6.3OV SOPN Inject 1.5 mg into the skin every Sunday.   6  . XARELTO 20 MG TABS tablet Take 20 mg by mouth daily.   6   No current facility-administered medications for this encounter.     Allergies  Allergen Reactions  . Procaine Hcl  Nausea And Vomiting    Per Pharmacy 09/09/2019, Okay for Lidocaine and Bupivicaine.    Social History   Socioeconomic History  . Marital status: Divorced    Spouse name: Not on file  . Number of children: Not on file  . Years of education: Not on file  . Highest education level: Not on file  Occupational History  . Not on file  Social Needs  . Financial resource strain: Not on file  . Food insecurity    Worry: Not on file    Inability: Not on file  . Transportation needs    Medical: Not on file    Non-medical: Not on file  Tobacco Use  . Smoking status: Never Smoker  . Smokeless tobacco: Never Used  Substance and Sexual Activity  . Alcohol use: Yes    Alcohol/week: 1.0 standard drinks    Types: 1 Standard  drinks or equivalent per week    Comment: seldom  . Drug use: No  . Sexual activity: Not on file  Lifestyle  . Physical activity    Days per week: Not on file    Minutes per session: Not on file  . Stress: Not on file  Relationships  . Social Musician on phone: Not on file    Gets together: Not on file    Attends religious service: Not on file    Active member of club or organization: Not on file    Attends meetings of clubs or organizations: Not on file    Relationship status: Not on file  . Intimate partner violence    Fear of current or ex partner: Not on file    Emotionally abused: Not on file    Physically abused: Not on file    Forced sexual activity: Not on file  Other Topics Concern  . 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 Hypertension Mother   . Diabetes Mother   . Alcohol abuse Father   . CAD Father   . Coronary artery disease Sister        with multiple stents  . Coronary artery disease Other        in multiple family members  . Hypertension Brother   . Diabetes Sister   . Diabetes Brother     ROS- All systems are reviewed and negative except as per the HPI above  Physical Exam: Vitals:   10/06/19 1515  BP: 136/82  Pulse: 88  Weight: 117.3 kg  Height: 6\' 3"  (1.905 m)   Wt Readings from Last 3 Encounters:  10/06/19 117.3 kg  09/09/19 116.1 kg  08/27/19 117 kg    Labs: Lab Results  Component Value Date   NA 145 (H) 08/27/2019   K 3.9 08/27/2019   CL 104 08/27/2019   CO2 25 08/27/2019   GLUCOSE 119 (H) 08/27/2019   BUN 14 08/27/2019   CREATININE 1.08 08/27/2019   CALCIUM 10.0 08/27/2019   MG 1.8 06/14/2019   Lab Results  Component Value Date   INR 1.29 08/06/2011   No results found for: CHOL, HDL, LDLCALC, TRIG   GEN- The patient is well appearing, alert and oriented x 3 today.   Head- normocephalic,  atraumatic Eyes-  Sclera clear, conjunctiva pink Ears- hearing intact Oropharynx- clear Neck- supple, no JVP Lymph- no cervical  lymphadenopathy Lungs- Clear to ausculation bilaterally, normal work of breathing Heart- Regular rate and rhythm, no murmurs, rubs or gallops, PMI not laterally displaced GI- soft, NT, ND, + BS Extremities- no clubbing, cyanosis, or edema MS- no significant deformity or atrophy Skin- no rash or lesion Psych- euthymic mood, full affect Neuro- strength and sensation are intact  EKG- NSR at 88 bpm, pr int 204 ms, qrs int 90 ms, qtc 474 ms  Epic [name] reviewed    Assessment and Plan: 1. Afib S/p ablation x one month Amiodarone/diltiazem stopped after ablation Staying in SR He wants to stop lasix/K+ He can take qod for one week and watch daily  weights / dyspnea/edema IF weight stays stable, stop drug and use as needed if has weight gain x 3-5 lbs in 1-2 days.  2. CHA2DS2VASc score of 2 Continue xarelto 20 mg daily  F/u with Dr. Johney Frame January 2021  Lupita Leash C[facility] [address] [place], [place] [address] [phone]

## 2019-10-07 ENCOUNTER — Ambulatory Visit (HOSPITAL_COMMUNITY): Payer: BC Managed Care – PPO | Admitting: Nurse Practitioner

## 2019-10-18 ENCOUNTER — Other Ambulatory Visit: Payer: Self-pay | Admitting: Internal Medicine

## 2019-10-26 ENCOUNTER — Encounter (HOSPITAL_COMMUNITY): Payer: Self-pay | Admitting: Internal Medicine

## 2019-12-06 ENCOUNTER — Other Ambulatory Visit: Payer: Self-pay | Admitting: Internal Medicine

## 2019-12-21 DIAGNOSIS — Z20822 Contact with and (suspected) exposure to covid-19: Secondary | ICD-10-CM | POA: Diagnosis not present

## 2020-01-06 ENCOUNTER — Other Ambulatory Visit (HOSPITAL_COMMUNITY): Payer: Self-pay | Admitting: Physician Assistant

## 2020-01-12 ENCOUNTER — Telehealth: Payer: Self-pay

## 2020-01-17 ENCOUNTER — Telehealth (INDEPENDENT_AMBULATORY_CARE_PROVIDER_SITE_OTHER): Payer: BC Managed Care – PPO | Admitting: Internal Medicine

## 2020-01-17 VITALS — BP 168/109 | HR 76 | Ht 75.0 in | Wt 252.0 lb

## 2020-01-17 DIAGNOSIS — I4819 Other persistent atrial fibrillation: Secondary | ICD-10-CM | POA: Diagnosis not present

## 2020-01-17 DIAGNOSIS — D6869 Other thrombophilia: Secondary | ICD-10-CM

## 2020-01-17 DIAGNOSIS — G4733 Obstructive sleep apnea (adult) (pediatric): Secondary | ICD-10-CM

## 2020-01-17 DIAGNOSIS — I10 Essential (primary) hypertension: Secondary | ICD-10-CM

## 2020-01-17 NOTE — Progress Notes (Signed)
Electrophysiology TeleHealth Note   Due to national recommendations of social distancing due to COVID 19, an audio telehealth visit is felt to be most appropriate for this patient at this time.  See MyChart message from today for the patient's consent to telehealth for [facility].   Date:  01/17/2020   ID:  Charles [name], DOB [birth year], MRN [id number]  Location: patient's home  Provider location:  [facility]  Evaluation Performed: Follow-up visit  PCP:  Default, Provider, MD   Electrophysiologist:  Dr Johney Frame  Chief Complaint:  palpitations  History of Present Illness:    Charles [name] is a 54 y.o. male who presents via telehealth conferencing today.  Since his ablation, the patient reports doing very well.  He has occasional palpitations.  His fatigue is improved.  Denies procedure related complications.  Today, he denies symptoms of palpitations, chest pain, shortness of breath,  lower extremity edema, dizziness, presyncope, or syncope.  The patient is otherwise without complaint today.  The patient denies symptoms of fevers, chills, cough, or new SOB worrisome for COVID 19.  Past Medical History:  Diagnosis Date  . Atrial fibrillation ([facility])   . Chronic systolic dysfunction of left ventricle   . Diabetes mellitus    A1c of 8% is an improvement from the previous level of 11%  . Hyperlipidemia   . Hypertension   . LVH (left ventricular hypertrophy)    Echo 08/3012-mild LVH  . Nonischemic cardiomyopathy ([facility])    39% - echo on 07/20/2011 -- EF of 45-50% and grade  2 diastolic dysfunction.  . Obesity   . Obstructive sleep apnea    noncompliant with CPAP  . Peripheral vascular disease [facility])     Past Surgical History:  Procedure Laterality Date  . ATRIAL FIBRILLATION ABLATION N/A 09/09/2019   Procedure: ATRIAL FIBRILLATION ABLATION;  Surgeon: Hillis Range, MD;  [facility] IN[facility] LAB;  Service: Cardiovascular;  Laterality: N/A;  . CARDIAC CATHETERIZATION   10/15/04   no sign. CAD  . CARDIOVERSION  08/09/11   successful  . CARDIOVERSION N/A 07/09/2019   Procedure: CARDIOVERSION;  Surgeon: Pricilla Riffle, MD;  Location: [facility] ENDOSCOPY;  Service: Cardiovascular;  Laterality: N/A;  . RADIOFREQUENCY ABLATION  10/01/2011   PVI for afib by Dr Johney Frame    Current Outpatient Medications  Medication Sig Dispense Refill  . APPLE CIDER VINEGAR PO Take 450-900 mg by mouth See admin instructions. Take 2 tablets (900 mg) by mouth in the morning & 1 tablet (450 mg) by mouth in the evening.    . cetirizine (ZYRTEC) 10 MG tablet Take 10 mg by mouth daily.     Marland Kitchen FARXIGA 10 MG TABS tablet Take 10 mg by mouth daily.   1  . furosemide (LASIX) 20 MG tablet TAKE 1 TABLET BY MOUTH EVERY DAY 90 tablet 1  . glipiZIDE (GLUCOTROL) 5 MG tablet Take 5 mg by mouth 2 (two) times daily before a meal.     . lisinopril (ZESTRIL) 40 MG tablet Take 40 mg by mouth daily.    . metFORMIN (GLUCOPHAGE-XR) 500 MG 24 hr tablet Take 1,000 mg by mouth 2 (two) times a day.     . metoprolol succinate (TOPROL-XL) 50 MG 24 hr tablet TAKE 1 TABLET BY MOUTH TWICE A DAY 180 tablet 2  . pantoprazole (PROTONIX) 40 MG tablet TAKE 1 TABLET BY MOUTH EVERY DAY 90 tablet 2  . potassium chloride (KLOR-CON) 10 MEQ tablet Take 10 mEq by mouth  every other day.    . TRULICITY 1.5 EN/2.7PO SOPN Inject 1.5 mg into the skin every Sunday.   6  . XARELTO 20 MG TABS tablet Take 20 mg by mouth daily.   6   No current facility-administered medications for this visit.    Allergies:   Procaine hcl   Social History:  The patient  reports that he has never smoked. He has never used smokeless tobacco. He reports current alcohol use of about 1.0 standard drinks of alcohol per week. He reports that he does not use drugs.   Family History:  The patient's family history includes Alcohol abuse in his father; CAD in his father; Coronary artery disease in his sister and another family member; Diabetes in his brother, mother, and  sister; Hypertension in his brother and mother.   ROS:  Please see the history of present illness.   All other systems are personally reviewed and negative.    Exam:    Vital Signs:  BP (!) 168/109   Pulse 76   Ht 6\' 3"  (1.905 m)   Wt 252 lb (114.3 kg)   BMI 31.50 kg/m   Well sounding, alert and conversant  Labs/Other Tests and Data Reviewed:    Recent Labs: 06/14/2019: B Natriuretic Peptide 604.0; Magnesium 1.8; TSH 2.792 08/27/2019: BUN 14; Creatinine, Ser 1.08; Hemoglobin 15.2; Platelets 221; Potassium 3.9; Sodium 145   Wt Readings from Last 3 Encounters:  01/17/20 252 lb (114.3 kg)  10/06/19 258 lb 9.6 oz (117.3 kg)  09/09/19 256 lb (116.1 kg)     ASSESSMENT & PLAN:    1.  Persistent atrial fibrillation Doing very well post ablation off AAD therapy chads2vasc score is 2.  Continue xarelto We discussed STOP AF trial results at length. We discussed [facility] and long term monitoring with ILR at length today.  2. HTN Elevated today He doesn't feel that it is elevated normally I have encouraged him to follow his BP closely at home. No change required today  3. OSA Not using CPAP We discussed at length today Compliance with CPAP encouraged I will refer to Carlena Sax for sleep management.  4. Obesity Lifestyle modification is encouraged We discussed STOP AF trial results at length.  Follow-up:  3 months with me   Patient Risk:  after full review of this patients clinical status, I feel that they are at moderate risk at this time.  Today, I have spent 15 minutes with the patient with telehealth technology discussing arrhythmia management .    Army Fossa, MD  01/17/2020 8:17 AM     [facility] [address] [place]  [address] [phone] (office) [phone] (fax)

## 2020-02-08 DIAGNOSIS — I10 Essential (primary) hypertension: Secondary | ICD-10-CM | POA: Diagnosis not present

## 2020-02-08 DIAGNOSIS — E782 Mixed hyperlipidemia: Secondary | ICD-10-CM | POA: Diagnosis not present

## 2020-02-08 DIAGNOSIS — E1165 Type 2 diabetes mellitus with hyperglycemia: Secondary | ICD-10-CM | POA: Diagnosis not present

## 2020-02-10 DIAGNOSIS — E782 Mixed hyperlipidemia: Secondary | ICD-10-CM | POA: Diagnosis not present

## 2020-02-10 DIAGNOSIS — I10 Essential (primary) hypertension: Secondary | ICD-10-CM | POA: Diagnosis not present

## 2020-02-10 DIAGNOSIS — E1165 Type 2 diabetes mellitus with hyperglycemia: Secondary | ICD-10-CM | POA: Diagnosis not present

## 2020-02-10 DIAGNOSIS — M25562 Pain in left knee: Secondary | ICD-10-CM | POA: Diagnosis not present

## 2020-02-10 DIAGNOSIS — J301 Allergic rhinitis due to pollen: Secondary | ICD-10-CM | POA: Diagnosis not present

## 2020-03-02 DIAGNOSIS — Z23 Encounter for immunization: Secondary | ICD-10-CM | POA: Diagnosis not present

## 2020-03-25 DIAGNOSIS — Z23 Encounter for immunization: Secondary | ICD-10-CM | POA: Diagnosis not present

## 2020-03-31 ENCOUNTER — Other Ambulatory Visit: Payer: Self-pay

## 2020-03-31 MED ORDER — POTASSIUM CHLORIDE ER 10 MEQ PO TBCR: 10.0000 meq | EXTENDED_RELEASE_TABLET | ORAL | 3 refills | Status: DC

## 2020-04-03 ENCOUNTER — Other Ambulatory Visit (HOSPITAL_COMMUNITY): Payer: Self-pay

## 2020-04-03 MED ORDER — FUROSEMIDE 20 MG PO TABS: 20.0000 mg | ORAL_TABLET | Freq: Every day | ORAL | 1 refills | Status: DC

## 2020-04-17 ENCOUNTER — Other Ambulatory Visit: Payer: Self-pay

## 2020-04-17 ENCOUNTER — Telehealth (INDEPENDENT_AMBULATORY_CARE_PROVIDER_SITE_OTHER): Payer: BC Managed Care – PPO | Admitting: Internal Medicine

## 2020-04-17 DIAGNOSIS — D6869 Other thrombophilia: Secondary | ICD-10-CM | POA: Diagnosis not present

## 2020-04-17 DIAGNOSIS — I4819 Other persistent atrial fibrillation: Secondary | ICD-10-CM | POA: Diagnosis not present

## 2020-04-17 DIAGNOSIS — I119 Hypertensive heart disease without heart failure: Secondary | ICD-10-CM

## 2020-04-17 DIAGNOSIS — G4733 Obstructive sleep apnea (adult) (pediatric): Secondary | ICD-10-CM

## 2020-04-17 NOTE — Progress Notes (Signed)
Electrophysiology TeleHealth Note   Due to national recommendations of social distancing due to COVID 19, an audio/video telehealth visit is felt to be most appropriate for this patient at this time.  See MyChart message from today for the patient's consent to telehealth for [facility].  Date:  04/17/2020   ID:  Charles [name], DOB [birth year], MRN [id number]  Location: patient's home  Provider location:  [place]  Evaluation Performed: Follow-up visit  PCP:  Default, Provider, MD   Electrophysiologist:  Dr Johney Frame  Chief Complaint:  palpitations  History of Present Illness:    Charles [name] is a 54 y.o. male who presents via telehealth conferencing today.  Since last being seen in our clinic, the patient reports doing very well.  He has had some palpitations and thinks that he may have been in afib.  Today, he denies symptoms of palpitations, chest pain, shortness of breath,  lower extremity edema, dizziness, presyncope, or syncope.  The patient is otherwise without complaint today.   Past Medical History:  Diagnosis Date  . Atrial fibrillation ([facility])   . Chronic systolic dysfunction of left ventricle   . Diabetes mellitus    A1c of 8% is an improvement from the previous level of 11%  . Hyperlipidemia   . Hypertension   . LVH (left ventricular hypertrophy)    Echo 08/3012-mild LVH  . Nonischemic cardiomyopathy ([facility])    39% - echo on 07/20/2011 -- EF of 45-50% and grade  2 diastolic dysfunction.  . Obesity   . Obstructive sleep apnea    noncompliant with CPAP  . Peripheral vascular disease [facility])     Past Surgical History:  Procedure Laterality Date  . ATRIAL FIBRILLATION ABLATION N/A 09/09/2019   Procedure: ATRIAL FIBRILLATION ABLATION;  Surgeon: Hillis Range, MD;  [facility] INVASIVE CV LAB;  Service: Cardiovascular;  Laterality: N/A;  . CARDIAC CATHETERIZATION  10/15/04   no sign. CAD  . CARDIOVERSION  08/09/11   successful  . CARDIOVERSION N/A 07/09/2019     Procedure: CARDIOVERSION;  Surgeon: Pricilla Riffle, MD;  Location: [facility] ENDOSCOPY;  Service: Cardiovascular;  Laterality: N/A;  . RADIOFREQUENCY ABLATION  10/01/2011   PVI for afib by Dr Johney Frame    Current Outpatient Medications  Medication Sig Dispense Refill  . APPLE CIDER VINEGAR PO Take 450-900 mg by mouth See admin instructions. Take 2 tablets (900 mg) by mouth in the morning & 1 tablet (450 mg) by mouth in the evening.    . cetirizine (ZYRTEC) 10 MG tablet Take 10 mg by mouth daily.     Marland Kitchen FARXIGA 10 MG TABS tablet Take 10 mg by mouth daily.   1  . furosemide (LASIX) 20 MG tablet Take 1 tablet (20 mg total) by mouth daily. 90 tablet 1  . glipiZIDE (GLUCOTROL) 5 MG tablet Take 5 mg by mouth 2 (two) times daily before a meal.     . lisinopril (ZESTRIL) 40 MG tablet Take 40 mg by mouth daily.    . metFORMIN (GLUCOPHAGE-XR) 500 MG 24 hr tablet Take 1,000 mg by mouth 2 (two) times a day.     . metoprolol succinate (TOPROL-XL) 50 MG 24 hr tablet TAKE 1 TABLET BY MOUTH TWICE A DAY 180 tablet 2  . pantoprazole (PROTONIX) 40 MG tablet TAKE 1 TABLET BY MOUTH EVERY DAY 90 tablet 2  . potassium chloride (KLOR-CON) 10 MEQ tablet Take 1 tablet (10 mEq total) by mouth every other day. 45 tablet 3  .  TRULICITY 1.5 OA/4.1YS SOPN Inject 1.5 mg into the skin every Sunday.   6  . XARELTO 20 MG TABS tablet Take 20 mg by mouth daily.   6   No current facility-administered medications for this visit.    Allergies:   Procaine hcl   Social History:  The patient  reports that he has never smoked. He has never used smokeless tobacco. He reports current alcohol use of about 1.0 standard drinks of alcohol per week. He reports that he does not use drugs.   ROS:  Please see the history of present illness.   All other systems are personally reviewed and negative.    Exam:    Vital Signs:  There were no vitals taken for this visit.  Well sounding and appearing, alert and conversant, regular work of breathing,   good skin color Eyes- anicteric, neuro- grossly intact, skin- no apparent rash or lesions or cyanosis, mouth- oral mucosa is pink  Labs/Other Tests and Data Reviewed:    Recent Labs: 06/14/2019: B Natriuretic Peptide 604.0; Magnesium 1.8; TSH 2.792 08/27/2019: BUN 14; Creatinine, Ser 1.08; Hemoglobin 15.2; Platelets 221; Potassium 3.9; Sodium 145   Wt Readings from Last 3 Encounters:  01/17/20 252 lb (114.3 kg)  10/06/19 258 lb 9.6 oz (117.3 kg)  09/09/19 256 lb (116.1 kg)     ASSESSMENT & PLAN:    1.  Persistent atrial fibrillation He has occasional palpitations and thinks that he is having afib. He is not able to further characterize his afib . I would advise ILR implant for long term monitoring post ablation Risks of implant including bleeding and infection were discussed today.  He wishes to proceed.  We will schedule at the next available time.  2. OSA Did not follow-up with Dr Maxwell Caul.  He says he did not hear from their office.  We will resend referral  3. Obesity Lifestyle modification is advised  4. Hypertensive cardiovascular disease Stable No change required today   Risks, benefits and potential toxicities for medications prescribed and/or refilled reviewed with patient today.   Follow-up:  In office for ILR implant with me at next available time.   Patient Risk:  after full review of this patients clinical status, I feel that they are at moderate risk at this time.  Today, I have spent 15 minutes with the patient with telehealth technology discussing arrhythmia management .    Army Fossa, MD  04/17/2020 3:58 PM     [place] [address] [place] [address] [phone] (office) [phone] (fax)

## 2020-04-17 NOTE — Addendum Note (Signed)
Addended by: Roney Mans A on: 04/17/2020 04:28 PM   Modules accepted: Orders

## 2020-05-31 ENCOUNTER — Ambulatory Visit: Payer: Self-pay | Admitting: Internal Medicine

## 2020-05-31 ENCOUNTER — Telehealth: Payer: Self-pay | Admitting: Internal Medicine

## 2020-05-31 NOTE — Telephone Encounter (Signed)
New message   Pt was called to RS loop appt. Pt states that he wants to hold off on appt for now because he hasn't had any issues since last visit with Dr. Johney Frame. He states that his sleep study is also not until July 13th and would like to wait to discuss that.

## 2020-06-02 DIAGNOSIS — I10 Essential (primary) hypertension: Secondary | ICD-10-CM | POA: Diagnosis not present

## 2020-06-02 DIAGNOSIS — E1165 Type 2 diabetes mellitus with hyperglycemia: Secondary | ICD-10-CM | POA: Diagnosis not present

## 2020-06-02 DIAGNOSIS — E782 Mixed hyperlipidemia: Secondary | ICD-10-CM | POA: Diagnosis not present

## 2020-06-07 DIAGNOSIS — Z1389 Encounter for screening for other disorder: Secondary | ICD-10-CM | POA: Diagnosis not present

## 2020-06-07 DIAGNOSIS — E782 Mixed hyperlipidemia: Secondary | ICD-10-CM | POA: Diagnosis not present

## 2020-06-07 DIAGNOSIS — K429 Umbilical hernia without obstruction or gangrene: Secondary | ICD-10-CM | POA: Diagnosis not present

## 2020-06-07 DIAGNOSIS — I10 Essential (primary) hypertension: Secondary | ICD-10-CM | POA: Diagnosis not present

## 2020-06-07 DIAGNOSIS — Z1331 Encounter for screening for depression: Secondary | ICD-10-CM | POA: Diagnosis not present

## 2020-06-07 DIAGNOSIS — E1165 Type 2 diabetes mellitus with hyperglycemia: Secondary | ICD-10-CM | POA: Diagnosis not present

## 2020-06-20 DIAGNOSIS — G4733 Obstructive sleep apnea (adult) (pediatric): Secondary | ICD-10-CM | POA: Diagnosis not present

## 2020-06-27 DIAGNOSIS — G4733 Obstructive sleep apnea (adult) (pediatric): Secondary | ICD-10-CM | POA: Diagnosis not present

## 2020-09-06 ENCOUNTER — Encounter: Payer: Self-pay | Admitting: *Deleted

## 2020-09-06 ENCOUNTER — Encounter: Payer: Self-pay | Admitting: Internal Medicine

## 2020-09-06 ENCOUNTER — Other Ambulatory Visit: Payer: Self-pay

## 2020-09-06 ENCOUNTER — Ambulatory Visit: Payer: BC Managed Care – PPO | Admitting: Internal Medicine

## 2020-09-06 VITALS — BP 138/80 | HR 86 | Ht 75.0 in | Wt 253.8 lb

## 2020-09-06 DIAGNOSIS — I4819 Other persistent atrial fibrillation: Secondary | ICD-10-CM

## 2020-09-06 DIAGNOSIS — I10 Essential (primary) hypertension: Secondary | ICD-10-CM

## 2020-09-06 DIAGNOSIS — G4733 Obstructive sleep apnea (adult) (pediatric): Secondary | ICD-10-CM | POA: Diagnosis not present

## 2020-09-06 NOTE — Progress Notes (Signed)
PCP: Richardean Chimera, MD   Primary EP: Dr Johney Frame  Charles [name] is a 54 y.o. male who presents today for routine electrophysiology followup.  Since last being seen in our clinic, the patient reports doing very well.  Today, he denies symptoms of palpitations, chest pain, shortness of breath,  lower extremity edema, dizziness, presyncope, or syncope.  The patient is otherwise without complaint today.   Past Medical History:  Diagnosis Date  . Atrial fibrillation (HCC)   . Chronic systolic dysfunction of left ventricle   . Diabetes mellitus    A1c of 8% is an improvement from the previous level of 11%  . Hyperlipidemia   . Hypertension   . LVH (left ventricular hypertrophy)    Echo 08/3012-mild LVH  . Nonischemic cardiomyopathy (HCC)    39% - echo on 07/20/2011 -- EF of 45-50% and grade  2 diastolic dysfunction.  . Obesity   . Obstructive sleep apnea    noncompliant with CPAP  . Peripheral vascular disease [facility])    Past Surgical History:  Procedure Laterality Date  . ATRIAL FIBRILLATION ABLATION N/A 09/09/2019   Procedure: ATRIAL FIBRILLATION ABLATION;  Surgeon: Hillis Range, MD;  Location: [facility] INVAS[facility];  Service: Cardiovascular;  Laterality: N/A;  . CARDIAC CATHETERIZATION  10/15/04   no sign. CAD  . CARDIOVERSION  08/09/11   successful  . CARDIOVERSION N/A 07/09/2019   Procedure: CARDIOVERSION;  Surgeon: Pricilla Riffle, MD;  Location: [facility] ENDOSCOPY;  Service: Cardiovascular;  Laterality: N/A;  . RADIOFREQUENCY ABLATION  10/01/2011   PVI for afib by Dr Johney Frame    ROS- all systems are reviewed and negatives except as per HPI above  Current Outpatient Medications  Medication Sig Dispense Refill  . APPLE CIDER VINEGAR PO Take 450-900 mg by mouth See admin instructions. Take 2 tablets (900 mg) by mouth in the morning & 1 tablet (450 mg) by mouth in the evening.    . cetirizine (ZYRTEC) 10 MG tablet Take 10 mg by mouth daily.     Marland Kitchen FARXIGA 10 MG TABS tablet Take 10 mg by  mouth daily.   1  . furosemide (LASIX) 20 MG tablet Take 1 tablet (20 mg total) by mouth daily. 90 tablet 1  . glipiZIDE (GLUCOTROL) 5 MG tablet Take 5 mg by mouth 2 (two) times daily before a meal.     . lisinopril (ZESTRIL) 40 MG tablet Take 40 mg by mouth daily.    . meloxicam (MOBIC) 15 MG tablet Take 15 mg by mouth as needed for muscle pain.    . metFORMIN (GLUCOPHAGE-XR) 500 MG 24 hr tablet Take 1,000 mg by mouth 2 (two) times a day.     . metoprolol succinate (TOPROL-XL) 50 MG 24 hr tablet TAKE 1 TABLET BY MOUTH TWICE A DAY 180 tablet 2  . pantoprazole (PROTONIX) 40 MG tablet TAKE 1 TABLET BY MOUTH EVERY DAY 90 tablet 2  . potassium chloride (KLOR-CON) 10 MEQ tablet Take 1 tablet (10 mEq total) by mouth every other day. 45 tablet 3  . TRULICITY 1.5 MG/0.5ML SOPN Inject 1.5 mg into the skin every Sunday.   6  . XARELTO 20 MG TABS tablet Take 20 mg by mouth daily.   6   No current facility-administered medications for this visit.    Physical Exam: Vitals:   09/06/20 1038  BP: 138/80  Pulse: 86  SpO2: 95%  Weight: 253 lb 12.8 oz (115.1 kg)  Height: 6\' 3"  (1.905 m)  GEN- The patient is well appearing, alert and oriented x 3 today.   Head- normocephalic, atraumatic Eyes-  Sclera clear, conjunctiva pink Ears- hearing intact Oropharynx- clear Lungs- Clear to ausculation bilaterally, normal work of breathing Heart- Regular rate and rhythm, no murmurs, rubs or gallops, PMI not laterally displaced GI- soft, NT, ND, + BS Extremities- no clubbing, cyanosis, or edema  Wt Readings from Last 3 Encounters:  09/06/20 253 lb 12.8 oz (115.1 kg)  01/17/20 252 lb (114.3 kg)  10/06/19 258 lb 9.6 oz (117.3 kg)    EKG tracing ordered today is personally reviewed and shows sinus rhythm, first degree AV block, LAD  Assessment and Plan:  1. Persistent afib Well controlled post ablation Lifestyle modification advised today  2. Overweight Lifestyle modification is advised  3. Mild  OSA Importance of treatment again discussed today He follows with Dr Earl Gala  4. HTN Stable No change required today   Risks, benefits and potential toxicities for medications prescribed and/or refilled reviewed with patient today.   Return to see me in 6 months  Hillis Range MD, [facility] 09/06/2020 11:04 AM

## 2020-09-06 NOTE — Patient Instructions (Addendum)
Medication Instructions:  Your physician recommends that you continue on your current medications as directed. Please refer to the Current Medication list given to you today.  *If you need a refill on your cardiac medications before your next appointment, please call your pharmacy*  Lab Work: None ordered.  If you have labs (blood work) drawn today and your tests are completely normal, you will receive your results only by: Marland Kitchen MyChart Message (if you have MyChart) OR . A paper copy in the mail If you have any lab test that is abnormal or we need to change your treatment, we will call you to review the results.  Testing/Procedures: None ordered.  Follow-Up: At [facility], you and your health needs are our priority.  As part of our continuing mission to provide you with exceptional heart care, we have created designated Provider Care Teams.  These Care Teams include your primary Cardiologist (physician) and Advanced Practice Providers (APPs -  Physician Assistants and Nurse Practitioners) who all work together to provide you with the care you need, when you need it.  We recommend signing up for the patient portal called "MyChart".  Sign up information is provided on this After Visit Summary.  MyChart is used to connect with patients for Virtual Visits (Telemedicine).  Patients are able to view lab/test results, encounter notes, upcoming appointments, etc.  Non-urgent messages can be sent to your provider as well.   To learn more about what you can do with MyChart, go to [email or web address].    Your next appointment:   Your physician wants you to follow-up in: 03/05/21 at 9:30 am with Dr. Johney Frame.   Other Instructions:

## 2020-09-12 DIAGNOSIS — D485 Neoplasm of uncertain behavior of skin: Secondary | ICD-10-CM | POA: Diagnosis not present

## 2020-09-12 DIAGNOSIS — Z6832 Body mass index (BMI) 32.0-32.9, adult: Secondary | ICD-10-CM | POA: Diagnosis not present

## 2020-09-28 DIAGNOSIS — C44529 Squamous cell carcinoma of skin of other part of trunk: Secondary | ICD-10-CM | POA: Diagnosis not present

## 2020-09-28 DIAGNOSIS — C44519 Basal cell carcinoma of skin of other part of trunk: Secondary | ICD-10-CM | POA: Diagnosis not present

## 2020-09-30 ENCOUNTER — Other Ambulatory Visit (HOSPITAL_COMMUNITY): Payer: Self-pay | Admitting: Physician Assistant

## 2020-10-11 DIAGNOSIS — E782 Mixed hyperlipidemia: Secondary | ICD-10-CM | POA: Diagnosis not present

## 2020-10-11 DIAGNOSIS — Z0001 Encounter for general adult medical examination with abnormal findings: Secondary | ICD-10-CM | POA: Diagnosis not present

## 2020-10-11 DIAGNOSIS — N400 Enlarged prostate without lower urinary tract symptoms: Secondary | ICD-10-CM | POA: Diagnosis not present

## 2020-10-11 DIAGNOSIS — E1165 Type 2 diabetes mellitus with hyperglycemia: Secondary | ICD-10-CM | POA: Diagnosis not present

## 2020-10-12 DIAGNOSIS — L570 Actinic keratosis: Secondary | ICD-10-CM | POA: Diagnosis not present

## 2020-10-16 DIAGNOSIS — E782 Mixed hyperlipidemia: Secondary | ICD-10-CM | POA: Diagnosis not present

## 2020-10-16 DIAGNOSIS — K429 Umbilical hernia without obstruction or gangrene: Secondary | ICD-10-CM | POA: Diagnosis not present

## 2020-10-16 DIAGNOSIS — E1165 Type 2 diabetes mellitus with hyperglycemia: Secondary | ICD-10-CM | POA: Diagnosis not present

## 2020-10-16 DIAGNOSIS — I10 Essential (primary) hypertension: Secondary | ICD-10-CM | POA: Diagnosis not present

## 2020-10-23 ENCOUNTER — Other Ambulatory Visit: Payer: Self-pay | Admitting: *Deleted

## 2020-10-23 MED ORDER — METOPROLOL SUCCINATE ER 50 MG PO TB24: 50.0000 mg | ORAL_TABLET | Freq: Two times a day (BID) | ORAL | 2 refills | Status: DC

## 2021-02-25 ENCOUNTER — Other Ambulatory Visit: Payer: Self-pay | Admitting: Internal Medicine

## 2021-02-27 IMAGING — DX PORTABLE CHEST - 1 VIEW
1 series · 1 of 1 positions shown · non-contrast
Comparison: None.

CLINICAL DATA: Pt complains of SOB w/ increased HR and BP beginning
early this morning. Pt states he had a cardioversion on [REDACTED]. Hx
of HTN and diabetes.SHOB

EXAM:
PORTABLE CHEST 1 VIEW

[chest ap]
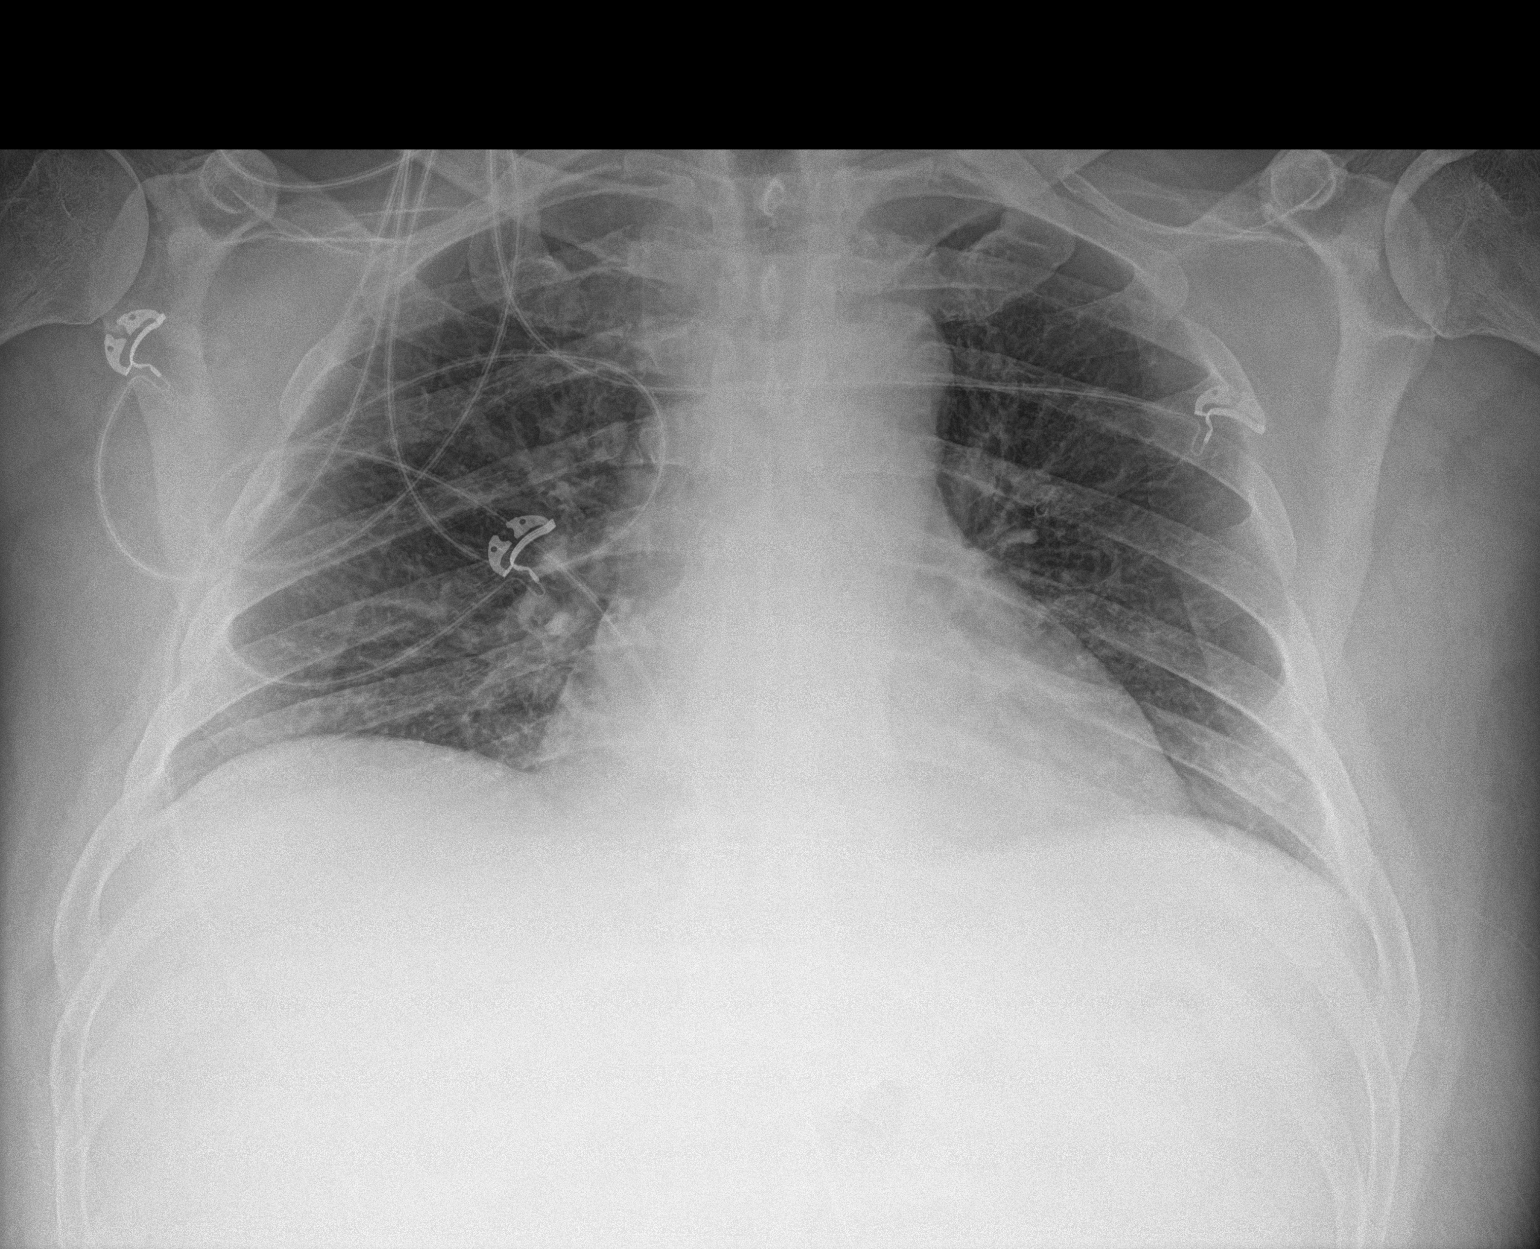

[1 of 1 positions shown; findings below may reference images not displayed]

FINDINGS: Normal mediastinum and cardiac silhouette. Normal pulmonary
vasculature. No evidence of effusion, infiltrate, or pneumothorax.
No acute bony abnormality.
IMPRESSION: No acute cardiopulmonary process.

## 2021-03-05 ENCOUNTER — Ambulatory Visit: Payer: BC Managed Care – PPO | Admitting: Internal Medicine

## 2021-03-07 ENCOUNTER — Other Ambulatory Visit: Payer: Self-pay

## 2021-03-07 ENCOUNTER — Ambulatory Visit: Payer: BC Managed Care – PPO | Admitting: Internal Medicine

## 2021-03-07 ENCOUNTER — Encounter: Payer: Self-pay | Admitting: Internal Medicine

## 2021-03-07 VITALS — BP 148/84 | HR 85 | Ht 75.0 in | Wt 249.0 lb

## 2021-03-07 DIAGNOSIS — I4819 Other persistent atrial fibrillation: Secondary | ICD-10-CM | POA: Diagnosis not present

## 2021-03-07 DIAGNOSIS — D6869 Other thrombophilia: Secondary | ICD-10-CM

## 2021-03-07 DIAGNOSIS — G4733 Obstructive sleep apnea (adult) (pediatric): Secondary | ICD-10-CM

## 2021-03-07 DIAGNOSIS — I10 Essential (primary) hypertension: Secondary | ICD-10-CM | POA: Diagnosis not present

## 2021-03-07 MED ORDER — METOPROLOL SUCCINATE ER 50 MG PO TB24: 50.0000 mg | ORAL_TABLET | Freq: Every day | ORAL | 3 refills | Status: DC

## 2021-03-07 NOTE — Progress Notes (Signed)
PCP: Richardean Chimera, MD   Primary EP: Dr Johney Frame  Charles [name] is a 55 y.o. male who presents today for routine electrophysiology followup.  Since last being seen in our clinic, the patient reports doing very well.  + rare palpitations Today, he denies symptoms of chest pain, shortness of breath,  lower extremity edema, dizziness, presyncope, or syncope.  The patient is otherwise without complaint today.   Past Medical History:  Diagnosis Date  . Atrial fibrillation (HCC)   . Chronic systolic dysfunction of left ventricle   . Diabetes mellitus    A1c of 8% is an improvement from the previous level of 11%  . Hyperlipidemia   . Hypertension   . LVH (left ventricular hypertrophy)    Echo 08/3012-mild LVH  . Nonischemic cardiomyopathy (HCC)    39% - echo on 07/20/2011 -- EF of 45-50% and grade  2 diastolic dysfunction.  . Obesity   . Obstructive sleep apnea    noncompliant with CPAP  . Peripheral vascular disease [facility])    Past Surgical History:  Procedure Laterality Date  . ATRIAL FIBRILLATION ABLATION N/A 09/09/2019   Procedure: ATRIAL FIBRILLATION ABLATION;  Surgeon: Hillis Range, MD;  Location: [facility] INVASIVE CV LAB;  Service: Cardiovascular;  Laterality: N/A;  . CARDIAC CATHETERIZATION  10/15/04   no sign. CAD  . CARDIOVERSION  08/09/11   successful  . CARDIOVERSION N/A 07/09/2019   Procedure: CARDIOVERSION;  Surgeon: Pricilla Riffle, MD;  Location: [facility] ENDOSCOPY;  Service: Cardiovascular;  Laterality: N/A;  . RADIOFREQUENCY ABLATION  10/01/2011   PVI for afib by Dr Johney Frame    ROS- all systems are reviewed and negatives except as per HPI above  Current Outpatient Medications  Medication Sig Dispense Refill  . APPLE CIDER VINEGAR PO Take 450-900 mg by mouth See admin instructions. Take 2 tablets (900 mg) by mouth in the morning & 1 tablet (450 mg) by mouth in the evening.    . cetirizine (ZYRTEC) 10 MG tablet Take 10 mg by mouth daily.     Marland Kitchen FARXIGA 10 MG TABS tablet Take 10 mg  by mouth daily.   1  . furosemide (LASIX) 20 MG tablet TAKE 1 TABLET BY MOUTH EVERY DAY 90 tablet 3  . glipiZIDE (GLUCOTROL) 5 MG tablet Take 5 mg by mouth 2 (two) times daily before a meal.     . lisinopril (ZESTRIL) 40 MG tablet Take 40 mg by mouth daily.    . metFORMIN (GLUCOPHAGE-XR) 500 MG 24 hr tablet Take 1,000 mg by mouth 2 (two) times a day.     . metoprolol succinate (TOPROL-XL) 50 MG 24 hr tablet Take 1 tablet (50 mg total) by mouth 2 (two) times daily. Take with or immediately following a meal. 180 tablet 2  . potassium chloride (KLOR-CON) 10 MEQ tablet TAKE 1 TABLET (10 MEQ TOTAL) BY MOUTH EVERY OTHER DAY. 45 tablet 3  . TRULICITY 1.5 MG/0.5ML SOPN Inject 1.5 mg into the skin every Sunday.   6  . XARELTO 20 MG TABS tablet Take 20 mg by mouth daily.   6   No current facility-administered medications for this visit.    Physical Exam: Vitals:   03/07/21 1627  BP: (!) 148/84  Pulse: 85  SpO2: 93%  Weight: 249 lb (112.9 kg)  Height: 6\' 3"  (1.905 m)    GEN- The patient is well appearing, alert and oriented x 3 today.   Head- normocephalic, atraumatic Eyes-  Sclera clear, conjunctiva pink Ears-  hearing intact Oropharynx- clear Lungs- Clear to ausculation bilaterally, normal work of breathing Heart- Regular rate and rhythm, no murmurs, rubs or gallops, PMI not laterally displaced GI- soft, NT, ND, + BS Extremities- no clubbing, cyanosis, or edema  Wt Readings from Last 3 Encounters:  03/07/21 249 lb (112.9 kg)  09/06/20 253 lb 12.8 oz (115.1 kg)  01/17/20 252 lb (114.3 kg)    EKG tracing ordered today is personally reviewed and shows sinus rhythm with PVCs, PR 202 msec  Assessment and Plan:  1. Persistent afib Well controlled post ablation Reduce toprol to 50mg  daily He has some fatigue.  May be able to wean metoprolol further on return.  2. Mild OSA Follows with Dr  3. HTN Stable No change required today  4. Overweight Body mass index is 31.12  kg/m. Lifestyle modification advised  Risks, benefits and potential toxicities for medications prescribed and/or refilled reviewed with patient today.   Return in 6 months  Earl Gala MD, [facility] 03/07/2021 4:43 PM

## 2021-03-07 NOTE — Patient Instructions (Addendum)
Medication Instructions:  Reduce your Metoprolol succinate to 50 mg daily.  Your physician recommends that you continue on your current medications as directed. Please refer to the Current Medication list given to you today.  Labwork: None ordered.  Testing/Procedures: None ordered.  Follow-Up: Your physician wants you to follow-up in: 09/07/21 at 4 pm with Dr. Johney Frame.   Any Other Special Instructions Will Be Listed Below (If Applicable).  If you need a refill on your cardiac medications before your next appointment, please call your pharmacy.

## 2021-08-14 ENCOUNTER — Other Ambulatory Visit: Payer: Self-pay | Admitting: Internal Medicine

## 2021-08-28 ENCOUNTER — Other Ambulatory Visit (HOSPITAL_COMMUNITY): Payer: Self-pay | Admitting: Internal Medicine

## 2021-09-07 ENCOUNTER — Ambulatory Visit: Payer: BC Managed Care – PPO | Admitting: Internal Medicine

## 2021-09-30 ENCOUNTER — Other Ambulatory Visit: Payer: Self-pay | Admitting: Internal Medicine

## 2021-11-16 ENCOUNTER — Encounter: Payer: Self-pay | Admitting: Internal Medicine

## 2021-11-16 ENCOUNTER — Ambulatory Visit: Payer: BC Managed Care – PPO | Admitting: Internal Medicine

## 2021-11-16 ENCOUNTER — Other Ambulatory Visit: Payer: Self-pay

## 2021-11-16 VITALS — BP 122/70 | HR 94 | Ht 75.0 in | Wt 248.6 lb

## 2021-11-16 DIAGNOSIS — I10 Essential (primary) hypertension: Secondary | ICD-10-CM

## 2021-11-16 DIAGNOSIS — I4819 Other persistent atrial fibrillation: Secondary | ICD-10-CM

## 2021-11-16 DIAGNOSIS — G4733 Obstructive sleep apnea (adult) (pediatric): Secondary | ICD-10-CM

## 2021-11-16 MED ORDER — METOPROLOL SUCCINATE ER 25 MG PO TB24: 25.0000 mg | ORAL_TABLET | Freq: Every day | ORAL | 3 refills | Status: DC

## 2021-11-16 NOTE — Patient Instructions (Addendum)
Medication Instructions:  Reduce Metoprolol succinate to 25 mg daily Your physician recommends that you continue on your current medications as directed. Please refer to the Current Medication list given to you today. *If you need a refill on your cardiac medications before your next appointment, please call your pharmacy*  Lab Work: None. If you have labs (blood work) drawn today and your tests are completely normal, you will receive your results only by: MyChart Message (if you have MyChart) OR A paper copy in the mail If you have any lab test that is abnormal or we need to change your treatment, we will call you to review the results.  Testing/Procedures: None.  Follow-Up: At [facility], you and your health needs are our priority.  As part of our continuing mission to provide you with exceptional heart care, we have created designated Provider Care Teams.  These Care Teams include your primary Cardiologist (physician) and Advanced Practice Providers (APPs -  Physician Assistants and Nurse Practitioners) who all work together to provide you with the care you need, when you need it.  Your physician wants you to follow-up in: 6 months in the Afib Clinic. They will contact you to schedule.     You will receive a reminder letter in the mail two months in advance. If you don't receive a letter, please call our office to schedule the follow-up appointment.  We recommend signing up for the patient portal called "MyChart".  Sign up information is provided on this After Visit Summary.  MyChart is used to connect with patients for Virtual Visits (Telemedicine).  Patients are able to view lab/test results, encounter notes, upcoming appointments, etc.  Non-urgent messages can be sent to your provider as well.   To learn more about what you can do with MyChart, go to [email or web address].    Any Other Special Instructions Will Be Listed Below (If Applicable).

## 2021-11-16 NOTE — Progress Notes (Signed)
PCP: Richardean Chimera, MD   Primary EP: Dr Johney Frame  Charles [name] is a 55 y.o. male who presents today for routine electrophysiology followup.  Since last being seen in our clinic, the patient reports doing very well.  Today, he denies symptoms of palpitations, chest pain, shortness of breath,  lower extremity edema, dizziness, presyncope, or syncope.  The patient is otherwise without complaint today.   Past Medical History:  Diagnosis Date   Atrial fibrillation (HCC)    Chronic systolic dysfunction of left ventricle    Diabetes mellitus    A1c of 8% is an improvement from the previous level of 11%   Hyperlipidemia    Hypertension    LVH (left ventricular hypertrophy)    Echo 08/3012-mild LVH   Nonischemic cardiomyopathy (HCC)    39% - echo on 07/20/2011 -- EF of 45-50% and grade  2 diastolic dysfunction.   Obesity    Obstructive sleep apnea    noncompliant with CPAP   Peripheral vascular disease (HCC)    Past Surgical History:  Procedure Laterality Date   ATRIAL FIBRILLATION ABLATION N/A 09/09/2019   Procedure: ATRIAL FIBRILLATION ABLATION;  Surgeon: Hillis Range, MD;  Location: [facility] INVASIVE CV LAB;  Service: Cardiovascular;  Laterality: N/A;   CARDIAC CATHETERIZATION  10/15/04   no sign. CAD   CARDIOVERSION  08/09/11   successful   CARDIOVERSION N/A 07/09/2019   Procedure: CARDIOVERSION;  Surgeon: Pricilla Riffle, MD;  Location: [facility] ENDOSCOPY;  Service: Cardiovascular;  Laterality: N/A;   RADIOFREQUENCY ABLATION  10/01/2011   PVI for afib by Dr Johney Frame    ROS- all systems are reviewed and negatives except as per HPI above  Current Outpatient Medications  Medication Sig Dispense Refill   APPLE CIDER VINEGAR PO Take 450-900 mg by mouth See admin instructions. Take 2 tablets (900 mg) by mouth in the morning & 1 tablet (450 mg) by mouth in the evening.     cetirizine (ZYRTEC) 10 MG tablet Take 10 mg by mouth daily.      FARXIGA 10 MG TABS tablet Take 10 mg by mouth daily.   1    furosemide (LASIX) 20 MG tablet TAKE 1 TABLET (20 MG TOTAL) BY MOUTH DAILY. PT NEEDS TO KEEP UPCOMING APPT IN DEC FOR FURTHER REFILLS 60 tablet 0   glipiZIDE (GLUCOTROL) 5 MG tablet Take 5 mg by mouth 2 (two) times daily before a meal.      lisinopril (ZESTRIL) 40 MG tablet Take 40 mg by mouth daily.     metFORMIN (GLUCOPHAGE-XR) 500 MG 24 hr tablet Take 1,000 mg by mouth 2 (two) times a day.      metoprolol succinate (TOPROL-XL) 50 MG 24 hr tablet Take 1 tablet (50 mg total) by mouth daily. With or immediately following a meal. 90 tablet 1   potassium chloride (KLOR-CON) 10 MEQ tablet TAKE 1 TABLET (10 MEQ TOTAL) BY MOUTH EVERY OTHER DAY. 45 tablet 3   TRULICITY 4.5 MG/0.5ML SOPN Inject into the skin.     XARELTO 20 MG TABS tablet Take 20 mg by mouth daily.   6   meloxicam (MOBIC) 15 MG tablet Take 15 mg by mouth daily. (Patient not taking: Reported on 11/16/2021)     TRULICITY 1.5 MG/0.5ML SOPN Inject 1.5 mg into the skin every Sunday.  (Patient not taking: Reported on 11/16/2021)  6   No current facility-administered medications for this visit.    Physical Exam: Vitals:   11/16/21 1424  BP: 122/70  Pulse: 94  SpO2: 97%  Weight: 248 lb 9.6 oz (112.8 kg)  Height: 6\' 3"  (1.905 m)    GEN- The patient is well appearing, alert and oriented x 3 today.   Head- normocephalic, atraumatic Eyes-  Sclera clear, conjunctiva pink Ears- hearing intact Oropharynx- clear Lungs- Clear to ausculation bilaterally, normal work of breathing Heart- Regular rate and rhythm, no murmurs, rubs or gallops, PMI not laterally displaced GI- soft, NT, ND, + BS Extremities- no clubbing, cyanosis, or edema  Wt Readings from Last 3 Encounters:  11/16/21 248 lb 9.6 oz (112.8 kg)  03/07/21 249 lb (112.9 kg)  09/06/20 253 lb 12.8 oz (115.1 kg)    EKG tracing ordered today is personally reviewed and shows sinus with PVCs, PR 220 msec, nonspecific ST/T changes  Assessment and Plan:  Well controlled post  ablation Reduce metoprolol to 25mg  daily   2. HTN Stable No change required today  3. OSA (mild) Follows with Dr 09/08/20  Risks, benefits and potential toxicities for medications prescribed and/or refilled reviewed with patient today.   Follow-up in AF clinic in 6 months  MD, [facility] 11/16/2021 2:36 PM

## 2022-01-03 ENCOUNTER — Other Ambulatory Visit: Payer: Self-pay | Admitting: Internal Medicine

## 2022-01-09 ENCOUNTER — Encounter (HOSPITAL_COMMUNITY): Payer: Self-pay | Admitting: Nurse Practitioner

## 2022-01-09 ENCOUNTER — Other Ambulatory Visit: Payer: Self-pay

## 2022-01-09 ENCOUNTER — Ambulatory Visit (HOSPITAL_COMMUNITY)
Admission: RE | Admit: 2022-01-09 | Discharge: 2022-01-09 | Disposition: A | Discharge status: Home / Self Care | Payer: BC Managed Care – PPO | Source: Ambulatory Visit | Attending: Nurse Practitioner | Admitting: Nurse Practitioner

## 2022-01-09 VITALS — BP 144/84 | HR 85 | Ht 75.0 in | Wt 245.4 lb

## 2022-01-09 DIAGNOSIS — E119 Type 2 diabetes mellitus without complications: Secondary | ICD-10-CM | POA: Diagnosis not present

## 2022-01-09 DIAGNOSIS — R0602 Shortness of breath: Secondary | ICD-10-CM | POA: Diagnosis present

## 2022-01-09 DIAGNOSIS — G4733 Obstructive sleep apnea (adult) (pediatric): Secondary | ICD-10-CM | POA: Insufficient documentation

## 2022-01-09 DIAGNOSIS — I480 Paroxysmal atrial fibrillation: Secondary | ICD-10-CM

## 2022-01-09 DIAGNOSIS — Z7901 Long term (current) use of anticoagulants: Secondary | ICD-10-CM | POA: Insufficient documentation

## 2022-01-09 DIAGNOSIS — I10 Essential (primary) hypertension: Secondary | ICD-10-CM | POA: Insufficient documentation

## 2022-01-09 DIAGNOSIS — R0609 Other forms of dyspnea: Secondary | ICD-10-CM

## 2022-01-09 DIAGNOSIS — R0600 Dyspnea, unspecified: Secondary | ICD-10-CM | POA: Diagnosis not present

## 2022-01-09 DIAGNOSIS — I4891 Unspecified atrial fibrillation: Secondary | ICD-10-CM | POA: Insufficient documentation

## 2022-01-09 DIAGNOSIS — R0789 Other chest pain: Secondary | ICD-10-CM

## 2022-01-09 DIAGNOSIS — D6869 Other thrombophilia: Secondary | ICD-10-CM

## 2022-01-09 LAB — COMPREHENSIVE METABOLIC PANEL
ALT: 19 U/L (ref 0–44)
AST: 25 U/L (ref 15–41)
Albumin: 4.3 g/dL (ref 3.5–5.0)
Alkaline Phosphatase: 31 U/L — ABNORMAL LOW (ref 38–126)
Anion gap: 11 (ref 5–15)
BUN: 11 mg/dL (ref 6–20)
CO2: 30 mmol/L (ref 22–32)
Calcium: 9.5 mg/dL (ref 8.9–10.3)
Chloride: 101 mmol/L (ref 98–111)
Creatinine, Ser: 1.01 mg/dL (ref 0.61–1.24)
GFR, Estimated: >60 mL/min (ref >60)
Glucose, Bld: 121 mg/dL — ABNORMAL HIGH (ref 70–99)
Potassium: 3.8 mmol/L (ref 3.5–5.1)
Sodium: 142 mmol/L (ref 135–145)
Total Bilirubin: 1.2 mg/dL (ref 0.3–1.2)
Total Protein: 6.7 g/dL (ref 6.5–8.1)

## 2022-01-09 LAB — TSH: TSH: 2.1 u[IU]/mL (ref 0.350–4.500)

## 2022-01-09 LAB — CBC
HCT: 43.6 % (ref 39.0–52.0)
Hemoglobin: 14.1 g/dL (ref 13.0–17.0)
MCH: 30.5 pg (ref 26.0–34.0)
MCHC: 32.3 g/dL (ref 30.0–36.0)
MCV: 94.4 fL (ref 80.0–100.0)
Platelets: 180 10*3/uL (ref 150–400)
RBC: 4.62 MIL/uL (ref 4.22–5.81)
RDW: 13.7 % (ref 11.5–15.5)
WBC: 7 10*3/uL (ref 4.0–10.5)
nRBC: 0 % (ref 0.0–0.2)

## 2022-01-09 LAB — BRAIN NATRIURETIC PEPTIDE: B Natriuretic Peptide: 2036.9 pg/mL — ABNORMAL HIGH (ref 0.0–100.0)

## 2022-01-09 NOTE — Progress Notes (Signed)
Primary Care Physician: Richardean Chimera, MD Referring Physician: Dr. Rennie Plowman is a 56 y.o. male with a h/o afib and ablation in 2020. Also h/o of  DM, HTN, LVH, LV dysfunction, OSA, not using CPAP recently.  He was taken off amiodarone after ablation. He has been staying in [place].   He asked to be seen today for new symptoms he has been having for the last several days. He has noted some shortness of breath with exertion and some mild chest tightness. It does not happen with all exertion. He also has noted some chest "gurgling" with lying done at night. He will sit up and clear his throat and it will help but may have to do this a couple of times a night. He denies any fluid retention, no PND/orthopnea. No recent illness, fever. No tobacco use. He does not eat late, no hot water brash in throat at night. No alcohol use.   Today, he denies symptoms of palpitations, chest pain, shortness of breath, orthopnea, PND, lower extremity edema, dizziness, presyncope, syncope, or neurologic sequela. The patient is tolerating medications without difficulties and is otherwise without complaint today.   Past Medical History:  Diagnosis Date   Atrial fibrillation (HCC)    Chronic systolic dysfunction of left ventricle    Diabetes mellitus    A1c of 8% is an improvement from the previous level of 11%   Hyperlipidemia    Hypertension    LVH (left ventricular hypertrophy)    Echo 08/3012-mild LVH   Nonischemic cardiomyopathy (HCC)    39% - echo on 07/20/2011 -- EF of 45-50% and grade  2 diastolic dysfunction.   Obesity    Obstructive sleep apnea    noncompliant with CPAP   Peripheral vascular disease (HCC)    Past Surgical History:  Procedure Laterality Date   ATRIAL FIBRILLATION ABLATION N/A 09/09/2019   Procedure: ATRIAL FIBRILLATION ABLATION;  Surgeon: Hillis Range, MD;  Location: [facility];  Service: Cardiovascular;  Laterality: N/A;   CARDIAC CATHETERIZATION  10/15/04   no  sign. CAD   CARDIOVERSION  08/09/11   successful   CARDIOVERSION N/A 07/09/2019   Procedure: CARDIOVERSION;  Surgeon: Pricilla Riffle, MD;  Location: [facility] ENDOSCOPY;  Service: Cardiovascular;  Laterality: N/A;   RADIOFREQUENCY ABLATION  10/01/2011   PVI for afib by Dr Johney Frame    Current Outpatient Medications  Medication Sig Dispense Refill   APPLE CIDER VINEGAR PO Take 450-900 mg by mouth See admin instructions. Take 2 tablets (900 mg) by mouth in the morning & 1 tablet (450 mg) by mouth in the evening.     cetirizine (ZYRTEC) 10 MG tablet Take 10 mg by mouth daily.      FARXIGA 10 MG TABS tablet Take 10 mg by mouth daily.   1   furosemide (LASIX) 20 MG tablet TAKE 1 TABLET BY MOUTH DAILY. PT NEEDS TO KEEP UPCOMING APPT IN DEC FOR FURTHER REFILLS 60 tablet 0   glipiZIDE (GLUCOTROL) 5 MG tablet Take 5 mg by mouth 2 (two) times daily before a meal.      lisinopril (ZESTRIL) 40 MG tablet Take 40 mg by mouth daily.     metFORMIN (GLUCOPHAGE-XR) 500 MG 24 hr tablet Take 1,000 mg by mouth 2 (two) times a day.      metoprolol succinate (TOPROL XL) 25 MG 24 hr tablet Take 1 tablet (25 mg total) by mouth daily. 90 tablet 3   potassium chloride (  KLOR-CON) 10 MEQ tablet TAKE 1 TABLET (10 MEQ TOTAL) BY MOUTH EVERY OTHER DAY. 45 tablet 3   TRULICITY 4.5 MG/0.5ML SOPN Inject 4.5 mg into the skin once a week. Injection every sunday     XARELTO 20 MG TABS tablet Take 20 mg by mouth daily.   6   No current facility-administered medications for this encounter.    Allergies  Allergen Reactions   Procaine Hcl Nausea And Vomiting    Per Pharmacy 09/09/2019, Okay for Lidocaine and Bupivicaine.    Social History   Socioeconomic History   Marital status: Divorced    Spouse name: Not on file   Number of children: Not on file   Years of education: Not on file   Highest education level: Not on file  Occupational History   Not on file  Tobacco Use   Smoking status: Never   Smokeless tobacco: Never  Vaping  Use   Vaping Use: Never used  Substance and Sexual Activity   Alcohol use: Yes    Alcohol/week: 1.0 standard drink    Types: 1 Standard drinks or equivalent per week    Comment: seldom   Drug use: No   Sexual activity: Not on file  Other Topics Concern   Not on file  Social History Narrative   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,  Scientist, product/process development         Social Determinants of Health   Financial Resource Strain: Not on file  Food Insecurity: Not on file  Transportation Needs: Not on file  Physical Activity: Not on file  Stress: Not on file  Social Connections: Not on file  Intimate Partner Violence: Not on file    Family History  Problem Relation Age of Onset   Hypertension Mother    Diabetes Mother    Alcohol abuse Father    CAD Father    Coronary artery disease Sister        with multiple stents   Coronary artery disease Other        in multiple family members   Hypertension Brother    Diabetes Sister    Diabetes Brother     ROS- All systems are reviewed and negative except as per the HPI above  Physical Exam: Vitals:   01/09/22 1557  BP: (!) 144/84  Pulse: 85  Weight: 111.3 kg  Height: 6\' 3"  (1.905 m)   Wt Readings from Last 3 Encounters:  01/09/22 111.3 kg  11/16/21 112.8 kg  03/07/21 112.9 kg    Labs: Lab Results  Component Value Date   NA 145 (H) 08/27/2019   K 3.9 08/27/2019   CL 104 08/27/2019   CO2 25 08/27/2019   GLUCOSE 119 (H) 08/27/2019   BUN 14 08/27/2019   CREATININE 1.08 08/27/2019   CALCIUM 10.0 08/27/2019   MG 1.8 06/14/2019   Lab Results  Component Value Date   INR 1.29 08/06/2011   No results found for: CHOL, HDL, LDLCALC, TRIG   GEN- The patient is well appearing, alert and oriented x 3 today.   Head- normocephalic, atraumatic Eyes-  Sclera clear, conjunctiva pink Ears- hearing intact Oropharynx- clear Neck- supple, no JVP Lymph- no cervical lymphadenopathy Lungs- Clear to ausculation bilaterally, normal work  of breathing Heart- Regular rate and rhythm, no murmurs, rubs or gallops, PMI not laterally displaced GI- soft, NT, ND, + BS Extremities- no clubbing, cyanosis, or edema MS- no significant deformity or atrophy Skin- no rash or lesion Psych- euthymic mood, full  affect Neuro- strength and sensation are intact  EKG-Sinus rhythm with first degree AVB, NSTW abnormality pr int 224 ms, qrs int 96 ms, qtc 468 ms     Assessment and Plan:  1. Exertional Chest discomfort with shortness of breath   Has just started in the last 7-10 days, intermittent   Will order lexi myoview  Echo ordered as well  Cmet/tsh/cmet/cbc  2. Chest congestion "gurgling" at night CXR ordered today   3. Afib  Quiet Staying in [place]  Continue metoprolol xl 25 mg daily   4. CHA2DS2VASc score of 2 Continue xarelto 20 mg daily   I will let pt know results as they come in If chest pain episode that persists, go to ER   Lupita Leash [facility] [address] [place], [place] [address] [phone]   BNP

## 2022-01-11 ENCOUNTER — Other Ambulatory Visit (HOSPITAL_COMMUNITY): Payer: Self-pay | Admitting: *Deleted

## 2022-01-11 ENCOUNTER — Other Ambulatory Visit (HOSPITAL_COMMUNITY): Payer: Self-pay | Admitting: Nurse Practitioner

## 2022-01-11 DIAGNOSIS — R0789 Other chest pain: Secondary | ICD-10-CM

## 2022-01-11 DIAGNOSIS — R079 Chest pain, unspecified: Secondary | ICD-10-CM

## 2022-01-11 MED ORDER — FUROSEMIDE 40 MG PO TABS: 40.0000 mg | ORAL_TABLET | Freq: Every day | ORAL | 3 refills | Status: DC

## 2022-01-11 MED ORDER — POTASSIUM CHLORIDE ER 10 MEQ PO TBCR: 10.0000 meq | EXTENDED_RELEASE_TABLET | Freq: Every day | ORAL | 3 refills | Status: DC

## 2022-01-15 ENCOUNTER — Telehealth (HOSPITAL_COMMUNITY): Payer: Self-pay

## 2022-01-15 NOTE — Telephone Encounter (Signed)
Detailed instructions left on the patient's answering machine. Asked to call back with any questions. S.Karlina Suares EMTP 

## 2022-01-16 ENCOUNTER — Other Ambulatory Visit (HOSPITAL_COMMUNITY): Payer: Self-pay | Admitting: Nurse Practitioner

## 2022-01-22 ENCOUNTER — Ambulatory Visit (HOSPITAL_BASED_OUTPATIENT_CLINIC_OR_DEPARTMENT_OTHER): Payer: BC Managed Care – PPO

## 2022-01-22 ENCOUNTER — Other Ambulatory Visit (HOSPITAL_COMMUNITY): Payer: Self-pay | Admitting: *Deleted

## 2022-01-22 ENCOUNTER — Ambulatory Visit (HOSPITAL_COMMUNITY): Payer: BC Managed Care – PPO | Attending: Cardiology

## 2022-01-22 ENCOUNTER — Other Ambulatory Visit: Payer: Self-pay

## 2022-01-22 VITALS — Ht 75.0 in | Wt 245.0 lb

## 2022-01-22 DIAGNOSIS — R110 Nausea: Secondary | ICD-10-CM | POA: Insufficient documentation

## 2022-01-22 DIAGNOSIS — R9439 Abnormal result of other cardiovascular function study: Secondary | ICD-10-CM

## 2022-01-22 DIAGNOSIS — I480 Paroxysmal atrial fibrillation: Secondary | ICD-10-CM

## 2022-01-22 DIAGNOSIS — R079 Chest pain, unspecified: Secondary | ICD-10-CM | POA: Diagnosis not present

## 2022-01-22 LAB — ECHOCARDIOGRAM COMPLETE
Area-P 1/2: 4.61 cm2
Height: 75 in
P 1/2 time: 495 msec
S' Lateral: 4.4 cm
Weight: 3920 oz

## 2022-01-22 LAB — MYOCARDIAL PERFUSION IMAGING
LV dias vol: 213 mL (ref 62–150)
LV sys vol: 157 mL
Nuc Stress EF: 26 %
Peak HR: 108 {beats}/min
Rest HR: 89 {beats}/min
Rest Nuclear Isotope Dose: 10.1 mCi
SDS: 3
SRS: 8
SSS: 11
ST Depression (mm): 0 mm
Stress Nuclear Isotope Dose: 31.5 mCi
TID: 1.04

## 2022-01-22 MED ORDER — REGADENOSON 0.4 MG/5ML IV SOLN
0.4000 mg | Freq: Once | INTRAVENOUS | Status: AC
  Administered 2022-01-22: 0.4 mg via INTRAVENOUS

## 2022-01-22 MED ORDER — TECHNETIUM TC 99M TETROFOSMIN IV KIT
10.1000 | PACK | Freq: Once | INTRAVENOUS | Status: AC | PRN
  Administered 2022-01-22: 10.1 via INTRAVENOUS
  Filled 2022-01-22: qty 11

## 2022-01-22 MED ORDER — AMINOPHYLLINE 25 MG/ML IV SOLN
75.0000 mg | Freq: Once | INTRAVENOUS | Status: AC
  Administered 2022-01-22: 75 mg via INTRAVENOUS

## 2022-01-22 MED ORDER — TECHNETIUM TC 99M TETROFOSMIN IV KIT
31.5000 | PACK | Freq: Once | INTRAVENOUS | Status: AC | PRN
  Administered 2022-01-22: 31.5 via INTRAVENOUS
  Filled 2022-01-22: qty 32

## 2022-01-22 MED ORDER — PERFLUTREN LIPID MICROSPHERE
1.0000 mL | INTRAVENOUS | Status: AC | PRN
  Administered 2022-01-22: 1 mL via INTRAVENOUS

## 2022-01-23 NOTE — Progress Notes (Signed)
Cardiology Office Note    Date:  01/24/2022   ID:  Charles [name], DOB [birth year], MRN [id number]  PCP:  Caryl Bis, MD  Cardiologist:  Fransico Him, MD   Chief Complaint  Patient presents with   Chest Pain   Shortness of Breath   Cardiomyopathy   Atrial Fibrillation    History of Present Illness:  Charles [name] is a 56 y.o. male who is being seen today for the evaluation of abnormal nuclear stress test at the request of Charles Needs, NP.  This is a 56 y.o. male with a h/o afib and ablation in 2020. Also h/o of  DM, HTN, LVH, LV dysfunction, OSA, not using CPAP recently.  He was taken off amiodarone after ablation. He has been staying in [place].   he is followed in A-fib clinic.  Recently he complained of shortness of breath with exertion and mild chest tightness.  He underwent nuclear stress test.  In January 23 showing a small fixed defect with moderate reduction in uptake in the apical to basal inferior location and severe LV dysfunction with EF 26% with inferior infarct.  There was no ischemia but this was read as a high risk study given his LV dysfunction.  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  2D echo 01/2022 showed severe LV dysfunction with EF 20-25% with moderate RVE and moderate RV dysfunction, G3DD and mild AR.    He is now referred for further evaluation.  He tells me that he has had some mild thihtness in his chest but he says it was not real bothersome.  He has had problems recently with DOE, PND and orthopnea. He was started on lasix and he thinks that his SOB has improved and he no longer has PND or orthopnea.  He denies any LE edema, syncope or palpitations.  He has had problems with nausea and some dizziness. He has never smoked but he has a strong fm hx of premature CAD and CHF.   Past  Medical History:  Diagnosis Date   Atrial fibrillation ([place])    Chronic systolic dysfunction of left ventricle    Diabetes mellitus    A1c of 8% is an improvement from the previous level of 11%   Hyperlipidemia    Hypertension    LVH (left ventricular hypertrophy)    Echo 08/3012-mild LVH   Nonischemic cardiomyopathy ([place])    39% - echo on 07/20/2011 -- EF of 45-50% and grade  2 diastolic dysfunction.   Obesity    Obstructive sleep apnea    noncompliant with CPAP   Peripheral vascular disease ([place])     Past Surgical History:  Procedure Laterality Date   ATRIAL FIBRILLATION ABLATION N/A 09/09/2019   Procedure: ATRIAL FIBRILLATION ABLATION;  Surgeon: Thompson Grayer, MD;  Location: [facility] CV [facility];  Service: Cardiovascular;  Laterality: N/A;   CARDIAC CATHETERIZATION  10/15/04   no sign. CAD   CARDIOVERSION  08/09/11   successful   CARDIOVERSION N/A 07/09/2019   Procedure: CARDIOVERSION;  Surgeon: Fay Records, MD;  Location: [facility] ENDOSCOPY;  Service: Cardiovascular;  Laterality: N/A;   RADIOFREQUENCY ABLATION  10/01/2011   PVI for afib by Dr Rayann Heman    Current Medications: Current Meds  Medication Sig   APPLE CIDER VINEGAR PO Take 450-900 mg by mouth  See admin instructions. Take 2 tablets (900 mg) by mouth in the morning & 1 tablet (450 mg) by mouth in the evening.   cetirizine (ZYRTEC) 10 MG tablet Take 10 mg by mouth daily.    FARXIGA 10 MG TABS tablet Take 10 mg by mouth daily.    furosemide (LASIX) 40 MG tablet TAKE 1 TABLET BY MOUTH EVERY DAY   glipiZIDE (GLUCOTROL) 5 MG tablet Take 5 mg by mouth 2 (two) times daily before a meal.    lisinopril (ZESTRIL) 40 MG tablet Take 40 mg by mouth daily.   metFORMIN (GLUCOPHAGE-XR) 500 MG 24 hr tablet Take 1,000 mg by mouth 2 (two) times a day.    metoprolol succinate (TOPROL XL) 25 MG 24 hr tablet Take 1 tablet (25 mg total) by mouth daily.   OZEMPIC, 0.25 OR 0.5 MG/DOSE, 2 MG/1.5ML SOPN Inject 0.25 mg into the skin once a week.    potassium chloride (KLOR-CON) 10 MEQ tablet Take 1 tablet (10 mEq total) by mouth daily.   XARELTO 20 MG TABS tablet Take 20 mg by mouth daily.     Allergies:   Procaine hcl   Social History   Socioeconomic History   Marital status: Divorced    Spouse name: Not on file   Number of children: Not on file   Years of education: Not on file   Highest education level: Not on file  Occupational History   Not on file  Tobacco Use   Smoking status: Never   Smokeless tobacco: Never  Vaping Use   Vaping Use: Never used  Substance and Sexual Activity   Alcohol use: Yes    Alcohol/week: 1.0 standard drink    Types: 1 Standard drinks or equivalent per week    Comment: seldom   Drug use: No   Sexual activity: Not on file  Other Topics Concern   Not on file  Social History Narrative   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   ROS:   Please see the history of present illness.    ROS All other systems reviewed and are negative.  No flowsheet data found.     PHYSICAL EXAM:   VS:  BP 116/62    Pulse 100    Ht 6\' 3"  (1.905 m)    Wt 236 lb (107 kg)    SpO2 98%    BMI 29.50 kg/m    GEN: Well nourished, well developed, in no acute distress  HEENT: normal  Neck: no JVD, carotid bruits, or masses Cardiac: RRR; no murmurs, rubs, or gallops,no edema.  Intact distal pulses bilaterally.  Respiratory:  clear to auscultation bilaterally, normal work of breathing GI: soft, nontender,  nondistended, + BS MS: no deformity or atrophy  Skin: warm and dry, no rash Neuro:  Alert and Oriented x 3, Strength and sensation are intact Psych: euthymic mood, full affect  Wt Readings from Last 3 Encounters:  01/24/22 236 lb (  107 kg)  01/22/22 245 lb (111.1 kg)  01/09/22 245 lb 6.4 oz (111.3 kg)      Studies/Labs Reviewed:   EKG:  EKG is not ordered today.    Recent Labs: 01/09/2022: ALT 19; B Natriuretic Peptide 2,036.9; BUN 11; Creatinine, Ser 1.01; Hemoglobin 14.1; Platelets 180; Potassium 3.8; Sodium 142; TSH 2.100   Lipid Panel No results found for: CHOL, TRIG, HDL, CHOLHDL, VLDL, LDLCALC, LDLDIRECT   CHA2DS2-VASc Score = 4  This indicates a 4.8% annual risk of stroke. The patient's score is based upon: CHF History: 1 HTN History: 1 Diabetes History: 1 Stroke History: 0 Vascular Disease History: 1 Age Score: 0 Gender Score: 0   Additional studies/ records that were reviewed today include:  Nuclear stress test and Pulmonary vein/coronay Ca score 2020    ASSESSMENT:    1. Abnormal nuclear stress test   2. Nonischemic cardiomyopathy ([place])   3. PAF (paroxysmal atrial fibrillation) ([place])   4. Obstructive sleep apnea   5. Primary hypertension   6. Pure hypercholesterolemia   7. Coronary artery disease due to lipid rich plaque      PLAN:  In order of problems listed above:  Abnormal nuclear stress test -Nuclear stress test notes showed no ischemia but showed marked LV dysfunction -He has been having some mild chest pain and shortness of breath -See #2  2.  Dilated cardiomyopathy -He has a history of nonischemic dilated cardiomyopathy with EF 39% with subsequent improvement in the EF of 40 to 50% by echo in 2012  -2D echo done 01/22/2022 showed new dilated cardiomyopathy with EF 20 to 25% with global hypokinesis and grade 3 diastolic dysfunction.  There is moderate RV enlargement with reduced RV function and mild AI. -Coronary calcium score was only 3.2  in 2020 but he could have developed significant CAD since then -I think he would benefit from right and left heart catheterization given his biventricular failure.  I will set him up with one of the advanced heart failure physicians to -Shared Decision Making/Informed Consent The risks [stroke (1 in 1000), death (1 in 44), kidney failure [usually temporary] (1 in 500), bleeding (1 in 200), allergic reaction [possibly serious] (1 in 200)], benefits (diagnostic support and management of coronary artery disease) and alternatives of a cardiac catheterization were discussed in detail with Mr. Hammond and he is willing to proceed. -Continue Farxiga 10 mg daily, Lasix 40 mg daily, Toprol-XL 25 mg daily. -Will change lisinopril 40 mg daily to Entresto 24-26 mg twice daily after holding lisinopril for 48 hours. -Follow-up in Pharm.D. CHF clinic in 2 weeks for up titration of Entresto and eventual addition of spironolactone as BP and renal function tolerates -Will check ferritin and HIV and SPEP/UPEP to look for secondary causes of DCM  3.  PAF -Status post A-fib ablation on 09/09/2019 and a history of several cardioversions -He is maintaining normal sinus rhythm on exam -Amiodarone was stopped after his ablation -Continue prescription drug management with Xarelto 20 mg daily for CHA2DS2-VASc score of 4 and continue Toprol-XL 25 mg daily with as needed refills  4.  Obstructive sleep apnea -He currently is not on CPAP -I will set him up for home sleep study  5.  Hypertension -BP is controlled -Continue prescription drug management with Toprol-XL 25 mg daily -Stop lisinopril and after 48 hours start Entresto 24-26 mg twice daily due to his LV dysfunction  6.  Hyperlipidemia -LDL goal less than 70 -He has not been on any statin  therapy -I have personally reviewed and interpreted outside labs performed by patient's PCP which showed LDL 84, HDL 39, TAGS 159 on 10/17/2021  -start Atorvastatin 20mg  daily and  repeat FLP and ALT in 8 weeks  7.  ASCHD -Coronary calcium score in 2020 showed calcium score 3.2 with mild plaque in the proximal LAD -No aspirin due to DOAC -Continue beta-blocker -See #2 for work-up of chest pain and abnormal nuclear stress test with worsening LV function  Time Spent: 30 minutes total time of encounter, including 20 minutes spent in face-to-face patient care on the date of this encounter. This time includes coordination of care and counseling regarding above mentioned problem list. Remainder of non-face-to-face time involved reviewing chart documents/testing relevant to the patient encounter and documentation in the medical record. I have independently reviewed documentation from referring provider  Medication Adjustments/Labs and Tests Ordered: Current medicines are reviewed at length with the patient today.  Concerns regarding medicines are outlined above.  Medication changes, Labs and Tests ordered today are listed in the Patient Instructions below.  There are no Patient Instructions on file for this visit.   Signed, Fransico Him, MD  01/24/2022 1:38 PM    [facility] [place], [place], [place]  [address] Phone: [phone]; Fax: [phone]

## 2022-01-23 NOTE — H&P (View-Only) (Signed)
Cardiology Office Note    Date:  01/24/2022   ID:  Charles [name], DOB 11/07/1966, MRN [id number]  PCP:  Caryl Bis, MD  Cardiologist:  Fransico Him, MD   Chief Complaint  Patient presents with   Chest Pain   Shortness of Breath   Cardiomyopathy   Atrial Fibrillation    History of Present Illness:  Charles [name] is a 56 y.o. male who is being seen today for the evaluation of abnormal nuclear stress test at the request of Sherran Needs, NP.  This is a 56 y.o. male with a h/o afib and ablation in 2020. Also h/o of  DM, HTN, LVH, LV dysfunction, OSA, not using CPAP recently.  He was taken off amiodarone after ablation. He has been staying in [place].   he is followed in A-fib clinic.  Recently he complained of shortness of breath with exertion and mild chest tightness.  He underwent nuclear stress test.  In January 23 showing a small fixed defect with moderate reduction in uptake in the apical to basal inferior location and severe LV dysfunction with EF 26% with inferior infarct.  There was no ischemia but this was read as a high risk study given his LV dysfunction.  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  2D echo 01/2022 showed severe LV dysfunction with EF 20-25% with moderate RVE and moderate RV dysfunction, G3DD and mild AR.    He is now referred for further evaluation.  He tells me that he has had some mild thihtness in his chest but he says it was not real bothersome.  He has had problems recently with DOE, PND and orthopnea. He was started on lasix and he thinks that his SOB has improved and he no longer has PND or orthopnea.  He denies any LE edema, syncope or palpitations.  He has had problems with nausea and some dizziness. He has never smoked but he has a strong fm hx of premature CAD and CHF.   Past  Medical History:  Diagnosis Date   Atrial fibrillation ([place])    Chronic systolic dysfunction of left ventricle    Diabetes mellitus    A1c of 8% is an improvement from the previous level of 11%   Hyperlipidemia    Hypertension    LVH (left ventricular hypertrophy)    Echo 08/3012-mild LVH   Nonischemic cardiomyopathy ([place])    39% - echo on 07/20/2011 -- EF of 45-50% and grade  2 diastolic dysfunction.   Obesity    Obstructive sleep apnea    noncompliant with CPAP   Peripheral vascular disease ([place])     Past Surgical History:  Procedure Laterality Date   ATRIAL FIBRILLATION ABLATION N/A 09/09/2019   Procedure: ATRIAL FIBRILLATION ABLATION;  Surgeon: Thompson Grayer, MD;  Location: [place] [facility];  Service: Cardiovascular;  Laterality: N/A;   CARDIAC CATHETERIZATION  10/15/04   no sign. CAD   CARDIOVERSION  08/09/11   successful   CARDIOVERSION N/A 07/09/2019   Procedure: CARDIOVERSION;  Surgeon: Fay Records, MD;  Location: [facility] ENDOSCOPY;  Service: Cardiovascular;  Laterality: N/A;   RADIOFREQUENCY ABLATION  10/01/2011   PVI for afib by Dr Rayann Heman    Current Medications: Current Meds  Medication Sig   APPLE CIDER VINEGAR PO Take 450-900 mg by mouth  See admin instructions. Take 2 tablets (900 mg) by mouth in the morning & 1 tablet (450 mg) by mouth in the evening.   cetirizine (ZYRTEC) 10 MG tablet Take 10 mg by mouth daily.    FARXIGA 10 MG TABS tablet Take 10 mg by mouth daily.    furosemide (LASIX) 40 MG tablet TAKE 1 TABLET BY MOUTH EVERY DAY   glipiZIDE (GLUCOTROL) 5 MG tablet Take 5 mg by mouth 2 (two) times daily before a meal.    lisinopril (ZESTRIL) 40 MG tablet Take 40 mg by mouth daily.   metFORMIN (GLUCOPHAGE-XR) 500 MG 24 hr tablet Take 1,000 mg by mouth 2 (two) times a day.    metoprolol succinate (TOPROL XL) 25 MG 24 hr tablet Take 1 tablet (25 mg total) by mouth daily.   OZEMPIC, 0.25 OR 0.5 MG/DOSE, 2 MG/1.5ML SOPN Inject 0.25 mg into the skin once a week.    potassium chloride (KLOR-CON) 10 MEQ tablet Take 1 tablet (10 mEq total) by mouth daily.   XARELTO 20 MG TABS tablet Take 20 mg by mouth daily.     Allergies:   Procaine hcl   Social History   Socioeconomic History   Marital status: Divorced    Spouse name: Not on file   Number of children: Not on file   Years of education: Not on file   Highest education level: Not on file  Occupational History   Not on file  Tobacco Use   Smoking status: Never   Smokeless tobacco: Never  Vaping Use   Vaping Use: Never used  Substance and Sexual Activity   Alcohol use: Yes    Alcohol/week: 1.0 standard drink    Types: 1 Standard drinks or equivalent per week    Comment: seldom   Drug use: No   Sexual activity: Not on file  Other Topics Concern   Not on file  Social History Narrative   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   ROS:   Please see the history of present illness.    ROS All other systems reviewed and are negative.  No flowsheet data found.     PHYSICAL EXAM:   VS:  BP 116/62    Pulse 100    Ht 6\' 3"  (1.905 m)    Wt 236 lb (107 kg)    SpO2 98%    BMI 29.50 kg/m    GEN: Well nourished, well developed, in no acute distress  HEENT: normal  Neck: no JVD, carotid bruits, or masses Cardiac: RRR; no murmurs, rubs, or gallops,no edema.  Intact distal pulses bilaterally.  Respiratory:  clear to auscultation bilaterally, normal work of breathing GI: soft, nontender,  nondistended, + BS MS: no deformity or atrophy  Skin: warm and dry, no rash Neuro:  Alert and Oriented x 3, Strength and sensation are intact Psych: euthymic mood, full affect  Wt Readings from Last 3 Encounters:  01/24/22 236 lb (  107 kg)  °01/22/22 245 lb (111.1 kg)  °01/09/22 245 lb 6.4 oz (111.3 kg)  °  ° ° °Studies/Labs Reviewed:  ° °EKG:  EKG is not ordered today.   ° °Recent Labs: °01/09/2022: ALT 19; B Natriuretic Peptide 2,036.9; BUN 11; Creatinine, Ser 1.01; Hemoglobin 14.1; Platelets 180; Potassium 3.8; Sodium 142; TSH 2.100  ° °Lipid Panel °No results found for: CHOL, TRIG, HDL, CHOLHDL, VLDL, LDLCALC, LDLDIRECT ° ° °CHA2DS2-VASc Score = 4  °This indicates a 4.8% annual risk of stroke. °The patient's score is based upon: °CHF History: 1 °HTN History: 1 °Diabetes History: 1 °Stroke History: 0 °Vascular Disease History: 1 °Age Score: 0 °Gender Score: 0 °  °Additional studies/ records that were reviewed today include:  °Nuclear stress test and Pulmonary vein/coronay Ca score 2020 ° ° ° °ASSESSMENT:   ° °1. Abnormal nuclear stress test   °2. Nonischemic cardiomyopathy (HCC)   °3. PAF (paroxysmal atrial fibrillation) (HCC)   °4. Obstructive sleep apnea   °5. Primary hypertension   °6. Pure hypercholesterolemia   °7. Coronary artery disease due to lipid rich plaque   ° ° ° °PLAN:  °In order of problems listed above: ° °Abnormal nuclear stress test °-Nuclear stress test notes showed no ischemia but showed marked LV dysfunction °-He has been having some mild chest pain and shortness of breath °-See #2 ° °2.  Dilated cardiomyopathy °-He has a history of nonischemic dilated cardiomyopathy with EF 39% with subsequent improvement in the EF of 40 to 50% by echo in 2012  °-2D echo done 01/22/2022 showed new dilated cardiomyopathy with EF 20 to 25% with global hypokinesis and grade 3 diastolic dysfunction.  There is moderate RV enlargement with reduced RV function and mild AI. °-Coronary calcium score was only 3.2  in 2020 but he could have developed significant CAD since then °-I think he would benefit from right and left heart catheterization given his biventricular failure.  I will set him up with one of the advanced heart failure physicians to °-Shared Decision Making/Informed Consent °The risks [stroke (1 in 1000), death (1 in 1000), kidney failure [usually temporary] (1 in 500), bleeding (1 in 200), allergic reaction [possibly serious] (1 in 200)], benefits (diagnostic support and management of coronary artery disease) and alternatives of a cardiac catheterization were discussed in detail with Charles [name] and he is willing to proceed. °-Continue Farxiga 10 mg daily, Lasix 40 mg daily, Toprol-XL 25 mg daily. °-Will change lisinopril 40 mg daily to Entresto 24-26 mg twice daily after holding lisinopril for 48 hours. °-Follow-up in Pharm.D. CHF clinic in 2 weeks for up titration of Entresto and eventual addition of spironolactone as BP and renal function tolerates °-Will check ferritin and HIV and SPEP/UPEP to look for secondary causes of DCM ° °3.  PAF °-Status post A-fib ablation on 09/09/2019 and a history of several cardioversions °-He is maintaining normal sinus rhythm on exam °-Amiodarone was stopped after his ablation °-Continue prescription drug management with Xarelto 20 mg daily for CHA2DS2-VASc score of 4 and continue Toprol-XL 25 mg daily with as needed refills ° °4.  Obstructive sleep apnea °-He currently is not on CPAP °-I will set him up for home sleep study ° °5.  Hypertension °-BP is controlled °-Continue prescription drug management with Toprol-XL 25 mg daily °-Stop lisinopril and after 48 hours start Entresto 24-26 mg twice daily due to his LV dysfunction ° °6.  Hyperlipidemia °-LDL goal less than 70 °-He has not been on any statin   therapy -I have personally reviewed and interpreted outside labs performed by patient's PCP which showed LDL 84, HDL 39, TAGS 159 on 10/17/2021  -start Atorvastatin 20mg  daily and  repeat FLP and ALT in 8 weeks  7.  ASCHD -Coronary calcium score in 2020 showed calcium score 3.2 with mild plaque in the proximal LAD -No aspirin due to DOAC -Continue beta-blocker -See #2 for work-up of chest pain and abnormal nuclear stress test with worsening LV function  Time Spent: 30 minutes total time of encounter, including 20 minutes spent in face-to-face patient care on the date of this encounter. This time includes coordination of care and counseling regarding above mentioned problem list. Remainder of non-face-to-face time involved reviewing chart documents/testing relevant to the patient encounter and documentation in the medical record. I have independently reviewed documentation from referring provider  Medication Adjustments/Labs and Tests Ordered: Current medicines are reviewed at length with the patient today.  Concerns regarding medicines are outlined above.  Medication changes, Labs and Tests ordered today are listed in the Patient Instructions below.  There are no Patient Instructions on file for this visit.   Signed, Fransico Him, MD  01/24/2022 1:38 PM    [facility] [place], [place], [place]  [address] Phone: [phone]; Fax: [phone]

## 2022-01-24 ENCOUNTER — Ambulatory Visit: Payer: BC Managed Care – PPO | Admitting: Cardiology

## 2022-01-24 ENCOUNTER — Other Ambulatory Visit: Payer: Self-pay

## 2022-01-24 ENCOUNTER — Encounter: Payer: Self-pay | Admitting: Cardiology

## 2022-01-24 VITALS — BP 116/62 | HR 100 | Ht 75.0 in | Wt 236.0 lb

## 2022-01-24 DIAGNOSIS — E7800 Pure hypercholesterolemia, unspecified: Secondary | ICD-10-CM

## 2022-01-24 DIAGNOSIS — I428 Other cardiomyopathies: Secondary | ICD-10-CM | POA: Diagnosis not present

## 2022-01-24 DIAGNOSIS — I480 Paroxysmal atrial fibrillation: Secondary | ICD-10-CM | POA: Diagnosis not present

## 2022-01-24 DIAGNOSIS — I2510 Atherosclerotic heart disease of native coronary artery without angina pectoris: Secondary | ICD-10-CM

## 2022-01-24 DIAGNOSIS — I2583 Coronary atherosclerosis due to lipid rich plaque: Secondary | ICD-10-CM

## 2022-01-24 DIAGNOSIS — G4733 Obstructive sleep apnea (adult) (pediatric): Secondary | ICD-10-CM

## 2022-01-24 DIAGNOSIS — R9439 Abnormal result of other cardiovascular function study: Secondary | ICD-10-CM | POA: Diagnosis not present

## 2022-01-24 DIAGNOSIS — I10 Essential (primary) hypertension: Secondary | ICD-10-CM

## 2022-01-24 MED ORDER — ENTRESTO 24-26 MG PO TABS: 1.0000 | ORAL_TABLET | Freq: Two times a day (BID) | ORAL | 11 refills | Status: DC

## 2022-01-24 MED ORDER — ATORVASTATIN CALCIUM 20 MG PO TABS: 20.0000 mg | ORAL_TABLET | Freq: Every day | ORAL | 3 refills | Status: DC

## 2022-01-24 NOTE — Patient Instructions (Signed)
Medication Instructions:  Your physician has recommended you make the following change in your medication:  1) STOP Lisinopril 2) START Entresto 24-26mg  twice a day, start this 48 hours after stopping Lisinopril 3) START Atorvastatin 20mg  once daily  *If you need a refill on your cardiac medications before your next appointment, please call your pharmacy*   Lab Work: In 8 weeks: fasting lipids If you have labs (blood work) drawn today and your tests are completely normal, you will receive your results only by: MyChart Message (if you have MyChart) OR A paper copy in the mail If you have any lab test that is abnormal or we need to change your treatment, we will call you to review the results.   Testing/Procedures: Your physician has requested that you have a cardiac catheterization. Cardiac catheterization is used to diagnose and/or treat various heart conditions. Doctors may recommend this procedure for a number of different reasons. The most common reason is to evaluate chest pain. Chest pain can be a symptom of coronary artery disease (CAD), and cardiac catheterization can show whether plaque is narrowing or blocking your hearts arteries. This procedure is also used to evaluate the valves, as well as measure the blood flow and oxygen levels in different parts of your heart. For further information please visit . Please follow instruction sheet, as given.   We will call you with a date and time for your cath.   Follow-Up: At [facility], you and your health needs are our priority.  As part of our continuing mission to provide you with exceptional heart care, we have created designated Provider Care Teams.  These Care Teams include your primary Cardiologist (physician) and Advanced Practice Providers (APPs -  Physician Assistants and Nurse Practitioners) who all work together to provide you with the care you need, when you need it. }   Follow-up with Advanced Heart  Failure Clinic 2 weeks after heart cath.  Other Instructions Your physician has referred you to PharmD for management of your heart failure medications in 2-3 weeks.

## 2022-01-24 NOTE — Addendum Note (Signed)
Addended by: Theresia Majors on: 01/24/2022 02:08 PM   Modules accepted: Orders

## 2022-01-24 NOTE — Addendum Note (Signed)
Addended by: Franchot Gallo on: 01/24/2022 01:56 PM   Modules accepted: Orders

## 2022-01-28 ENCOUNTER — Telehealth: Payer: Self-pay | Admitting: Cardiology

## 2022-01-28 NOTE — Telephone Encounter (Signed)
°  Patient is calling to discuss scheduling his cath procedure. Please advise.

## 2022-01-28 NOTE — Telephone Encounter (Signed)
Patient said he was waiting for a call about a heart cath date and time. Advised that Dr. Theodosia Blender nurse would give him a call back once that it set up.  Verbalized understanding.

## 2022-01-29 NOTE — Telephone Encounter (Signed)
Spoke with the patient and have made him aware of date/time of heart cath. Instructions have been reviewed and also sent through Mobile. Patient verbalized understanding.

## 2022-01-29 NOTE — Telephone Encounter (Signed)
Spoke with the patient and advised that I am waiting to confirm the date and time for his cath with the staff at [facility]. I will call him back once confirmed and go over his instructions. Patient verbalized understanding.

## 2022-01-31 ENCOUNTER — Telehealth: Payer: Self-pay | Admitting: Cardiology

## 2022-01-31 DIAGNOSIS — Z0279 Encounter for issue of other medical certificate: Secondary | ICD-10-CM

## 2022-01-31 NOTE — Telephone Encounter (Signed)
Form from Edrick Oh received on 01/31/2022. Completed patient auth attached. Took form to Dr. Mayford Knife box for completion. 01/31/2022 JMM

## 2022-02-06 ENCOUNTER — Telehealth: Payer: Self-pay | Admitting: *Deleted

## 2022-02-06 NOTE — Telephone Encounter (Signed)
Cardiac catheterization scheduled at [facility] for: Thursday February 07, 2022 10 AM ?[facility] Main Entrance [facility]) at: 8 AM ? ? ?Diet-no solid food after midnight prior to cath, clear liquids until 5 AM day of procedure. ? ?Medication instructions for procedure: ?-Hold:  ? Xarelto-last dose 9 AM 02/05/22 per pt-knows to hold until post procedure ? Metformin-day of procedure and 48 hours post procedure ? Glipizide/Farxiga-AM of procedure ? Lasix/KCl-AM of procedure ?-Except hold medications usual morning medications can be taken pre-cath with sips of water including aspirin 81 mg. ?   ?Must have responsible adult to drive home post procedure and be with patient first 24 hours after arriving home. ? ?[facility] does allow one visitor to wait in the waiting room during the time you are there. ? ? ?Patient reports does not currently have any new symptoms concerning for COVID-19 and no household members with COVID-19 like illness.  ? ? ? ?Reviewed procedure instructions with patient.  ?   ? ? ? ? ?

## 2022-02-07 ENCOUNTER — Encounter (HOSPITAL_COMMUNITY)
Admission: RE | Disposition: A | Discharge status: Home / Self Care | Payer: Self-pay | Source: Home / Self Care | Attending: Internal Medicine

## 2022-02-07 ENCOUNTER — Encounter (HOSPITAL_COMMUNITY): Payer: Self-pay | Admitting: Cardiology

## 2022-02-07 ENCOUNTER — Other Ambulatory Visit: Payer: Self-pay

## 2022-02-07 ENCOUNTER — Ambulatory Visit (HOSPITAL_COMMUNITY)
Admission: RE | Admit: 2022-02-07 | Discharge: 2022-02-07 | Disposition: A | Discharge status: Home / Self Care | Payer: BC Managed Care – PPO | Attending: Internal Medicine | Admitting: Internal Medicine

## 2022-02-07 ENCOUNTER — Encounter (HOSPITAL_COMMUNITY): Payer: Self-pay | Admitting: Internal Medicine

## 2022-02-07 DIAGNOSIS — I420 Dilated cardiomyopathy: Secondary | ICD-10-CM | POA: Insufficient documentation

## 2022-02-07 DIAGNOSIS — I480 Paroxysmal atrial fibrillation: Secondary | ICD-10-CM | POA: Diagnosis not present

## 2022-02-07 DIAGNOSIS — E7800 Pure hypercholesterolemia, unspecified: Secondary | ICD-10-CM | POA: Diagnosis not present

## 2022-02-07 DIAGNOSIS — I5022 Chronic systolic (congestive) heart failure: Secondary | ICD-10-CM

## 2022-02-07 DIAGNOSIS — G4733 Obstructive sleep apnea (adult) (pediatric): Secondary | ICD-10-CM | POA: Insufficient documentation

## 2022-02-07 DIAGNOSIS — I2510 Atherosclerotic heart disease of native coronary artery without angina pectoris: Secondary | ICD-10-CM | POA: Insufficient documentation

## 2022-02-07 DIAGNOSIS — R9439 Abnormal result of other cardiovascular function study: Secondary | ICD-10-CM | POA: Insufficient documentation

## 2022-02-07 DIAGNOSIS — E119 Type 2 diabetes mellitus without complications: Secondary | ICD-10-CM | POA: Insufficient documentation

## 2022-02-07 DIAGNOSIS — I110 Hypertensive heart disease with heart failure: Secondary | ICD-10-CM | POA: Diagnosis not present

## 2022-02-07 HISTORY — PX: RIGHT/LEFT HEART CATH AND CORONARY ANGIOGRAPHY: CATH118266

## 2022-02-07 LAB — POCT I-STAT EG7
Acid-Base Excess: 4 mmol/L — ABNORMAL HIGH (ref 0.0–2.0)
Acid-Base Excess: 3 mmol/L — ABNORMAL HIGH (ref 0.0–2.0)
Acid-Base Excess: 4 mmol/L — ABNORMAL HIGH (ref 0.0–2.0)
Bicarbonate: 30.6 mmol/L — ABNORMAL HIGH (ref 20.0–28.0)
Bicarbonate: 30.2 mmol/L — ABNORMAL HIGH (ref 20.0–28.0)
Bicarbonate: 30.7 mmol/L — ABNORMAL HIGH (ref 20.0–28.0)
Calcium, Ion: 1.18 mmol/L (ref 1.15–1.40)
Calcium, Ion: 1.14 mmol/L — ABNORMAL LOW (ref 1.15–1.40)
Calcium, Ion: 1.23 mmol/L (ref 1.15–1.40)
HCT: 44 % (ref 39.0–52.0)
HCT: 43 % (ref 39.0–52.0)
HCT: 46 % (ref 39.0–52.0)
Hemoglobin: 15 g/dL (ref 13.0–17.0)
Hemoglobin: 14.6 g/dL (ref 13.0–17.0)
Hemoglobin: 15.6 g/dL (ref 13.0–17.0)
O2 Saturation: 69 %
O2 Saturation: 73 %
O2 Saturation: 77 %
Potassium: 3 mmol/L — ABNORMAL LOW (ref 3.5–5.1)
Potassium: 3 mmol/L — ABNORMAL LOW (ref 3.5–5.1)
Potassium: 3.2 mmol/L — ABNORMAL LOW (ref 3.5–5.1)
Sodium: 143 mmol/L (ref 135–145)
Sodium: 142 mmol/L (ref 135–145)
Sodium: 143 mmol/L (ref 135–145)
TCO2: 32 mmol/L (ref 22–32)
TCO2: 32 mmol/L (ref 22–32)
TCO2: 32 mmol/L (ref 22–32)
pCO2, Ven: 54.9 mmHg (ref 44–60)
pCO2, Ven: 54.5 mmHg (ref 44–60)
pCO2, Ven: 54.7 mmHg (ref 44–60)
pH, Ven: 7.354 (ref 7.25–7.43)
pH, Ven: 7.352 (ref 7.25–7.43)
pH, Ven: 7.357 (ref 7.25–7.43)
pO2, Ven: 39 mmHg (ref 32–45)
pO2, Ven: 41 mmHg (ref 32–45)
pO2, Ven: 45 mmHg (ref 32–45)

## 2022-02-07 LAB — POCT I-STAT 7, (LYTES, BLD GAS, ICA,H+H)
Acid-Base Excess: 3 mmol/L — ABNORMAL HIGH (ref 0.0–2.0)
Bicarbonate: 29.9 mmol/L — ABNORMAL HIGH (ref 20.0–28.0)
Calcium, Ion: 1.13 mmol/L — ABNORMAL LOW (ref 1.15–1.40)
HCT: 44 % (ref 39.0–52.0)
Hemoglobin: 15 g/dL (ref 13.0–17.0)
O2 Saturation: 99 %
Potassium: 3 mmol/L — ABNORMAL LOW (ref 3.5–5.1)
Sodium: 143 mmol/L (ref 135–145)
TCO2: 31 mmol/L (ref 22–32)
pCO2 arterial: 51.9 mmHg — ABNORMAL HIGH (ref 32–48)
pH, Arterial: 7.369 (ref 7.35–7.45)
pO2, Arterial: 132 mmHg — ABNORMAL HIGH (ref 83–108)

## 2022-02-07 LAB — GLUCOSE, CAPILLARY
Glucose-Capillary: 192 mg/dL — ABNORMAL HIGH (ref 70–99)
Glucose-Capillary: 153 mg/dL — ABNORMAL HIGH (ref 70–99)

## 2022-02-07 SURGERY — RIGHT/LEFT HEART CATH AND CORONARY ANGIOGRAPHY
Anesthesia: LOCAL

## 2022-02-07 MED ORDER — HYDRALAZINE HCL 20 MG/ML IJ SOLN: 10.0000 mg | INTRAMUSCULAR | Status: DC | PRN

## 2022-02-07 MED ORDER — SODIUM CHLORIDE 0.9% FLUSH: 3.0000 mL | INTRAVENOUS | Status: DC | PRN

## 2022-02-07 MED ORDER — ASPIRIN 81 MG PO CHEW: 81.0000 mg | CHEWABLE_TABLET | ORAL | Status: DC

## 2022-02-07 MED ORDER — VERAPAMIL HCL 2.5 MG/ML IV SOLN
INTRAVENOUS | Status: DC | PRN
  Administered 2022-02-07: 10 mL via INTRA_ARTERIAL

## 2022-02-07 MED ORDER — LIDOCAINE HCL (PF) 1 % IJ SOLN
INTRAMUSCULAR | Status: AC
  Filled 2022-02-07: qty 30

## 2022-02-07 MED ORDER — FENTANYL CITRATE (PF) 100 MCG/2ML IJ SOLN
INTRAMUSCULAR | Status: AC
  Filled 2022-02-07: qty 2

## 2022-02-07 MED ORDER — LABETALOL HCL 5 MG/ML IV SOLN: 10.0000 mg | INTRAVENOUS | Status: DC | PRN

## 2022-02-07 MED ORDER — HEPARIN (PORCINE) IN NACL 1000-0.9 UT/500ML-% IV SOLN
INTRAVENOUS | Status: DC | PRN
  Administered 2022-02-07 (×2): 500 mL

## 2022-02-07 MED ORDER — SODIUM CHLORIDE 0.9% FLUSH: 3.0000 mL | Freq: Two times a day (BID) | INTRAVENOUS | Status: DC

## 2022-02-07 MED ORDER — SODIUM CHLORIDE 0.9 % IV SOLN: 250.0000 mL | INTRAVENOUS | Status: DC | PRN

## 2022-02-07 MED ORDER — MIDAZOLAM HCL 2 MG/2ML IJ SOLN
INTRAMUSCULAR | Status: DC | PRN
  Administered 2022-02-07: 2 mg via INTRAVENOUS

## 2022-02-07 MED ORDER — ACETAMINOPHEN 325 MG PO TABS: 650.0000 mg | ORAL_TABLET | ORAL | Status: DC | PRN

## 2022-02-07 MED ORDER — HEPARIN (PORCINE) IN NACL 1000-0.9 UT/500ML-% IV SOLN
INTRAVENOUS | Status: AC
  Filled 2022-02-07: qty 1000

## 2022-02-07 MED ORDER — ONDANSETRON HCL 4 MG/2ML IJ SOLN: 4.0000 mg | Freq: Four times a day (QID) | INTRAMUSCULAR | Status: DC | PRN

## 2022-02-07 MED ORDER — MIDAZOLAM HCL 2 MG/2ML IJ SOLN
INTRAMUSCULAR | Status: AC
  Filled 2022-02-07: qty 2

## 2022-02-07 MED ORDER — LIDOCAINE HCL (PF) 1 % IJ SOLN
INTRAMUSCULAR | Status: DC | PRN
  Administered 2022-02-07 (×2): 2 mL via INTRADERMAL

## 2022-02-07 MED ORDER — SODIUM CHLORIDE 0.9 % IV SOLN: INTRAVENOUS | Status: AC

## 2022-02-07 MED ORDER — VERAPAMIL HCL 2.5 MG/ML IV SOLN
INTRAVENOUS | Status: AC
Start: 2022-02-07 — End: ?
  Filled 2022-02-07: qty 2

## 2022-02-07 MED ORDER — IOHEXOL 350 MG/ML SOLN
INTRAVENOUS | Status: DC | PRN
  Administered 2022-02-07: 45 mL via INTRA_ARTERIAL

## 2022-02-07 MED ORDER — HEPARIN SODIUM (PORCINE) 1000 UNIT/ML IJ SOLN
INTRAMUSCULAR | Status: AC
  Filled 2022-02-07: qty 10

## 2022-02-07 MED ORDER — SODIUM CHLORIDE 0.9 % IV SOLN: INTRAVENOUS | Status: DC

## 2022-02-07 MED ORDER — FENTANYL CITRATE (PF) 100 MCG/2ML IJ SOLN
INTRAMUSCULAR | Status: DC | PRN
Start: 2022-02-07 — End: 2022-02-07
  Administered 2022-02-07: 25 ug via INTRAVENOUS

## 2022-02-07 MED ORDER — HEPARIN SODIUM (PORCINE) 1000 UNIT/ML IJ SOLN
INTRAMUSCULAR | Status: DC | PRN
  Administered 2022-02-07: 5000 [IU] via INTRAVENOUS

## 2022-02-07 SURGICAL SUPPLY — 9 items
CATH 5FR JL3.5 JR4 ANG PIG MP (CATHETERS) ×1 IMPLANT
CATH BALLN WEDGE 5F 110CM (CATHETERS) ×1 IMPLANT
DEVICE RAD COMP TR BAND LRG (VASCULAR PRODUCTS) ×1 IMPLANT
GLIDESHEATH SLEND SS 6F .021 (SHEATH) ×1 IMPLANT
GUIDEWIRE INQWIRE 1.5J.035X260 (WIRE) IMPLANT
INQWIRE 1.5J .035X260CM (WIRE) ×2
PACK CARDIAC CATHETERIZATION (CUSTOM PROCEDURE TRAY) ×2 IMPLANT
SHEATH GLIDE SLENDER 4/5FR (SHEATH) ×1 IMPLANT
TRANSDUCER W/STOPCOCK (MISCELLANEOUS) ×2 IMPLANT

## 2022-02-07 NOTE — Interval H&P Note (Signed)
History and Physical Interval Note: ? ?02/07/2022 ?10:55 AM ? ?Charles [name]  has presented today for surgery, with the diagnosis of dilated cardiomyopathy.  The various methods of treatment have been discussed with the patient and family. After consideration of risks, benefits and other options for treatment, the patient has consented to  Procedure(s): ?RIGHT/LEFT HEART CATH AND CORONARY ANGIOGRAPHY (N/A) and possible coronary angioplasty as a surgical intervention.  The patient's history has been reviewed, patient examined, no change in status, stable for surgery.  I have reviewed the patient's chart and labs.  Questions were answered to the patient's satisfaction.   ? ? ?Charles [name] ? ? ?

## 2022-02-08 ENCOUNTER — Telehealth: Payer: Self-pay | Admitting: Cardiology

## 2022-02-08 NOTE — Telephone Encounter (Signed)
Patient was calling to check the status of his FMLA paperwork. He got a call from his work stating they did not receive anything from our office yet  ?

## 2022-02-08 NOTE — Telephone Encounter (Signed)
Spoke with the patient and advised that his paperwork has not been completed by Dr. Radford Pax yet. He is requesting a call back when it is complete.  ?

## 2022-02-12 ENCOUNTER — Telehealth: Payer: Self-pay | Admitting: Cardiology

## 2022-02-12 NOTE — Telephone Encounter (Signed)
Calling in regards his fmla paperwork to see where the other two paperwork went through. Please advise ?

## 2022-02-12 NOTE — Telephone Encounter (Signed)
Left message for patient advising him that paperwork has been faxed and it is ready for him to come pick up.  ?

## 2022-02-12 NOTE — Telephone Encounter (Signed)
Spoke with the patient and advised that paperwork has been faxed. He is aware the original copy has been left here for him to pick up.  ?

## 2022-02-19 ENCOUNTER — Other Ambulatory Visit (HOSPITAL_COMMUNITY): Payer: Self-pay | Admitting: Nurse Practitioner

## 2022-02-20 ENCOUNTER — Other Ambulatory Visit: Payer: Self-pay

## 2022-02-20 MED ORDER — POTASSIUM CHLORIDE ER 10 MEQ PO TBCR: 10.0000 meq | EXTENDED_RELEASE_TABLET | Freq: Every day | ORAL | 3 refills | Status: DC

## 2022-02-20 NOTE — Progress Notes (Signed)
Refilled potassium

## 2022-02-21 ENCOUNTER — Ambulatory Visit: Payer: BC Managed Care – PPO | Admitting: Pharmacist

## 2022-02-21 ENCOUNTER — Other Ambulatory Visit: Payer: Self-pay

## 2022-02-21 VITALS — BP 112/75 | HR 76 | Wt 239.0 lb

## 2022-02-21 DIAGNOSIS — I519 Heart disease, unspecified: Secondary | ICD-10-CM

## 2022-02-21 MED ORDER — SPIRONOLACTONE 25 MG PO TABS: 12.5000 mg | ORAL_TABLET | Freq: Every day | ORAL | 11 refills | Status: DC

## 2022-02-21 NOTE — Progress Notes (Signed)
Patient ID: Charles [name]                 DOB: 10/25/1966                      MRN: [id number] ? ? ? ? ?HPI: ?Charles [name] is a 56 y.o. male referred by Dr. Mayford Knife to pharmacy clinic for HF medication management. PMH is significant for afib s/p ablation in 2020, DM, HTN, LVH, LV dysfunction, and OSA not on CPAP. Most recent LVEF 20-25% on 01/2022 echo. Pt was last seen by Dr Mayford Knife on 01/24/22. His lisnopril was stopped and replaced with Entresto 24-26mg  BID. He was also started on atorvastatin 20mg  daily. Cath 02/07/22 showed 20% prox RCA and mid LAD stenosis. ? ?Pt presents today for follow up. Reports tolerating medications well. Saw his PCP yesterday, BP was low at 100/50. Had labs drawn a week previously on 3/9 - SCr 0.93, Na 143, K not listed but pt advised it was normal. He will send in lab results via MyChart. Had his cholesterol checked as well after starting atorvastatin - LDL goal < 70. ? ?Denies dizziness, headache, LE edema, and CP. Slight SOB but reports this is improving and he's feeling better every day. Takes Entresto at 5/9 and 4-5pm. Has a BP cuff at home but hasn't been checking. ? ?Current CHF meds: Entresto 24-26mg  BID, Toprol 25mg  daily, Farxiga 10mg  daily, furosemide 40mg  daily + KCL [employer] daily ? ?BP goal: <130/30mmHg ? ?Family History:  ?Family History  ?Problem Relation Age of Onset  ? Hypertension Mother   ? Diabetes Mother   ? Alcohol abuse Father   ? CAD Father   ? Coronary artery disease Sister   ?     with multiple stents  ? Coronary artery disease Other   ?     in multiple family members  ? Hypertension Brother   ? Diabetes Sister   ? Diabetes Brother   ? ? ?Social History: Denies drug and tobacco use, seldom alcohol use. ? ?Wt Readings from Last 3 Encounters:  ?02/07/22 230 lb (104.3 kg)  ?01/24/22 236 lb (107 kg)  ?01/22/22 245 lb (111.1 kg)  ? ?BP Readings from Last 3 Encounters:  ?02/07/22 140/89  ?01/24/22 116/62  ?01/22/22 (!) 138/103  ? ?Pulse Readings from Last 3 Encounters:   ?02/07/22 78  ?01/24/22 100  ?01/09/22 85  ? ? ?Renal function: ?CrCl cannot be calculated (Patient's most recent lab result is older than the maximum 21 days allowed.). ? ?Past Medical History:  ?Diagnosis Date  ? Atrial fibrillation (HCC)   ? Chronic systolic dysfunction of left ventricle   ? Diabetes mellitus   ? A1c of 8% is an improvement from the previous level of 11%  ? Hyperlipidemia   ? Hypertension   ? LVH (left ventricular hypertrophy)   ? Echo 08/3012-mild LVH  ? Nonischemic cardiomyopathy (HCC)   ? 39% - echo on 07/20/2011 -- EF of 45-50% and grade  2 diastolic dysfunction.  ? Obesity   ? Obstructive sleep apnea   ? noncompliant with CPAP  ? Peripheral vascular disease (HCC)   ? ? ?Current Outpatient Medications on File Prior to Visit  ?Medication Sig Dispense Refill  ? APPLE CIDER VINEGAR PO Take 450-900 mg by mouth See admin instructions. Take 2 tablets (900 mg) by mouth in the morning & 1 tablet (450 mg) by mouth in the evening.    ? atorvastatin (LIPITOR) 20 MG tablet  Take 1 tablet (20 mg total) by mouth daily. 90 tablet 3  ? cetirizine (ZYRTEC) 10 MG tablet Take 10 mg by mouth daily.     ? FARXIGA 10 MG TABS tablet Take 10 mg by mouth daily.   1  ? furosemide (LASIX) 40 MG tablet TAKE 1 TABLET BY MOUTH EVERY DAY 90 tablet 1  ? glipiZIDE (GLUCOTROL) 5 MG tablet Take 5 mg by mouth 2 (two) times daily before a meal.     ? metFORMIN (GLUCOPHAGE-XR) 500 MG 24 hr tablet Take 1,000 mg by mouth 2 (two) times a day.     ? metoprolol succinate (TOPROL XL) 25 MG 24 hr tablet Take 1 tablet (25 mg total) by mouth daily. 90 tablet 3  ? OZEMPIC, 0.25 OR 0.5 MG/DOSE, 2 MG/1.5ML SOPN Inject 0.5 mg into the skin every Wednesday.    ? potassium chloride (KLOR-CON) 10 MEQ tablet TAKE 1 TABLET BY MOUTH EVERY DAY 90 tablet 1  ? potassium chloride (KLOR-CON) 10 MEQ tablet Take 1 tablet (10 mEq total) by mouth daily. 30 tablet 3  ? sacubitril-valsartan (ENTRESTO) 24-26 MG Take 1 tablet by mouth 2 (two) times daily. 60  tablet 11  ? XARELTO 20 MG TABS tablet Take 20 mg by mouth daily.   6  ? ?No current facility-administered medications on file prior to visit.  ? ? ?Allergies  ?Allergen Reactions  ? Procaine Hcl Nausea And Vomiting  ?  Per Pharmacy 09/09/2019, Okay for Lidocaine and Bupivicaine.  ? ? ? ?Assessment/Plan: ? ?1. CHF - BP at goal < 130/24mmHg with room to further optimize CHF medications. BP was low at PCP yesterday so will adjust meds slowly. Will start spironolactone 12.5mg  daily and continue Entresto 24-26mg  BID, Toprol 25mg  daily, Farxiga 10mg  daily, furosemide 40mg  daily + KCL daily. Pt will monitor BP at home and call if SBP < 100 consistently or if he becomes symptomatic. F/u with me in 10 days for BP check and BMET. ? ?Pt will send in MyChart message with BMET and lipid panel results from PCP last week. For some reason cannot see K or lipid panel in [facility] but can see SCr. ? ?Cath report mentions pt was to be set up with cMRI, pt also mentions he was advised to follow up with Dr in 2-3 weeks but he hasn't heard anything. Will send message to Carly to help coordinate this. ? ?Ana Liaw E. Azayla Polo, PharmD, BCACP, CPP ?[facility] ?[address], [place], [place] [address] ?Phone: [phone]; Fax: [phone] ?02/21/2022 4:29 PM ? ? ?

## 2022-02-21 NOTE — Patient Instructions (Signed)
Your blood pressure goal is < 130/42mmHg ? ?Start taking spironolactone - 1/2 tablet (12.5mg ) once daily ? ?Continue taking your other medications ? ?Monitor your blood pressure at home. Call clinic if your top reading is < 100 consistently ? ?Follow up on Tuesday, March 28th at 4pm for lab work and blood pressure check ? ?Send in your lab results via MyChart message: potassium (K) and lipid/cholesterol panel (total cholesterol, triglycerides, LDL, and HDL) ?

## 2022-02-25 ENCOUNTER — Encounter: Payer: Self-pay | Admitting: Pharmacist

## 2022-03-01 ENCOUNTER — Telehealth (HOSPITAL_COMMUNITY): Payer: Self-pay | Admitting: Vascular Surgery

## 2022-03-01 NOTE — Telephone Encounter (Signed)
Left vm giving new chf appt w/ DB , Asked pt to call back to confirm ?

## 2022-03-05 ENCOUNTER — Ambulatory Visit: Payer: BC Managed Care – PPO | Admitting: Pharmacist

## 2022-03-05 ENCOUNTER — Other Ambulatory Visit: Payer: Self-pay

## 2022-03-05 VITALS — BP 100/64 | HR 86 | Wt 239.0 lb

## 2022-03-05 DIAGNOSIS — I519 Heart disease, unspecified: Secondary | ICD-10-CM

## 2022-03-05 DIAGNOSIS — E782 Mixed hyperlipidemia: Secondary | ICD-10-CM

## 2022-03-05 DIAGNOSIS — E785 Hyperlipidemia, unspecified: Secondary | ICD-10-CM | POA: Insufficient documentation

## 2022-03-05 MED ORDER — ROSUVASTATIN CALCIUM 20 MG PO TABS: 20.0000 mg | ORAL_TABLET | Freq: Every day | ORAL | 11 refills | Status: DC

## 2022-03-05 NOTE — Progress Notes (Addendum)
Patient ID: Charles [name]                 DOB: [birth year]                      MRN: [id number] ? ? ? ? ?HPI: ?Charles [name] is a 56 y.o. male referred by Dr. Radford Pax to pharmacy clinic for HF medication management. PMH is significant for afib s/p ablation in 2020, DM, HTN, LVH, LV dysfunction, and OSA not on CPAP. Most recent LVEF 20-25% on 01/2022 echo. Pt was last seen by Dr Radford Pax on 01/24/22. His lisinopril was stopped and replaced with Entresto 24-26mg  BID. He was also started on atorvastatin 20mg  daily. Cath 02/07/22 showed 20% prox RCA and mid LAD stenosis. I saw pt on 02/21/22 and started him on spironolactone 12.5mg  daily. ? ?Pt presents today for follow up. Reports tolerating medications well. Denies dizziness, headache, SOB, CP, and LE edema. Had labs drawn at PCP on 3/9 - SCr 0.93, Na 143, K 3.9. Lipids since starting atorvastatin 20mg  daily: TC 119, TG 157, HDL 44, LDL 48. A1c 8.9%, PCP started him on Ozempic. Has noticed joint pain over the past few weeks. Feeling great aside from the joint pain. Recalls a home BP of 114/64, no SBP < 100. HR fluctuated 46-90 in clinic today, reports at home it's mainly in the 87s. ? ?Current CHF meds: Entresto 24-26mg  BID, Toprol 25mg  daily, Farxiga 10mg  daily, spironolactone 12.5mg  daily, furosemide 40mg  daily + KCL 23meq daily ? ?BP goal: <130/27mmHg ? ?Family History:  ?Family History  ?Problem Relation Age of Onset  ? Hypertension Mother   ? Diabetes Mother   ? Alcohol abuse Father   ? CAD Father   ? Coronary artery disease Sister   ?     with multiple stents  ? Coronary artery disease Other   ?     in multiple family members  ? Hypertension Brother   ? Diabetes Sister   ? Diabetes Brother   ? ? ?Social History: Denies drug and tobacco use, seldom alcohol use. ? ?Wt Readings from Last 3 Encounters:  ?02/21/22 239 lb (108.4 kg)  ?02/07/22 230 lb (104.3 kg)  ?01/24/22 236 lb (107 kg)  ? ?BP Readings from Last 3 Encounters:  ?02/21/22 112/75  ?02/07/22 140/89  ?01/24/22  116/62  ? ?Pulse Readings from Last 3 Encounters:  ?02/21/22 76  ?02/07/22 78  ?01/24/22 100  ? ? ?Renal function: ?CrCl cannot be calculated (Patient's most recent lab result is older than the maximum 21 days allowed.). ? ?Past Medical History:  ?Diagnosis Date  ? Atrial fibrillation ([place])   ? Chronic systolic dysfunction of left ventricle   ? Diabetes mellitus   ? A1c of 8% is an improvement from the previous level of 11%  ? Hyperlipidemia   ? Hypertension   ? LVH (left ventricular hypertrophy)   ? Echo 08/3012-mild LVH  ? Nonischemic cardiomyopathy ([place])   ? 39% - echo on 07/20/2011 -- EF of 45-50% and grade  2 diastolic dysfunction.  ? Obesity   ? Obstructive sleep apnea   ? noncompliant with CPAP  ? Peripheral vascular disease ([place])   ? ? ?Current Outpatient Medications on File Prior to Visit  ?Medication Sig Dispense Refill  ? APPLE CIDER VINEGAR PO Take 450-900 mg by mouth See admin instructions. Take 2 tablets (900 mg) by mouth in the morning & 1 tablet (450 mg) by mouth in the evening.    ?  atorvastatin (LIPITOR) 20 MG tablet Take 1 tablet (20 mg total) by mouth daily. 90 tablet 3  ? cetirizine (ZYRTEC) 10 MG tablet Take 10 mg by mouth daily.     ? FARXIGA 10 MG TABS tablet Take 10 mg by mouth daily.   1  ? furosemide (LASIX) 40 MG tablet TAKE 1 TABLET BY MOUTH EVERY DAY 90 tablet 1  ? glipiZIDE (GLUCOTROL) 5 MG tablet Take 5 mg by mouth 2 (two) times daily before a meal.     ? metFORMIN (GLUCOPHAGE-XR) 500 MG 24 hr tablet Take 1,000 mg by mouth 2 (two) times a day.     ? metoprolol succinate (TOPROL XL) 25 MG 24 hr tablet Take 1 tablet (25 mg total) by mouth daily. 90 tablet 3  ? OZEMPIC, 0.25 OR 0.5 MG/DOSE, 2 MG/1.5ML SOPN Inject 1 mg into the skin every Wednesday.    ? potassium chloride (KLOR-CON) 10 MEQ tablet Take 1 tablet (10 mEq total) by mouth daily. 30 tablet 3  ? sacubitril-valsartan (ENTRESTO) 24-26 MG Take 1 tablet by mouth 2 (two) times daily. 60 tablet 11  ? spironolactone (ALDACTONE) 25 MG  tablet Take 0.5 tablets (12.5 mg total) by mouth daily. 15 tablet 11  ? XARELTO 20 MG TABS tablet Take 20 mg by mouth daily.   6  ? ?No current facility-administered medications on file prior to visit.  ? ? ?Allergies  ?Allergen Reactions  ? Procaine Hcl Nausea And Vomiting  ?  Per Pharmacy 09/09/2019, Okay for Lidocaine and Bupivicaine.  ? ? ? ?Assessment/Plan: ? ?1. CHF - BP soft today at 100/64 preventing further titration of CHF meds. Checking BMET today with recent spironolactone start. Will continue Entresto 24-26mg  BID, Toprol 25mg  daily, Farxiga 10mg  daily, spironolactone 12.5mg  daily, and furosemide 40mg  daily. Pending BMET results, can stop his low dose KCl 29meq daily supplement. He is establishing with advanced CHF clinic and has first appt with Dr Haroldine Laws next month. F/u with me as needed. ? ?2. Hyperlipidemia - LDL now 46 on atorvastatin 20mg  daily at goal < 55 given ASCVD + DM, however pt has started noticing joint pain over the past few weeks. Will stop atorvastatin, advised pt to monitor for sx improvement over the next week, then start rosuvastatin 20mg  daily. ? ?Addendum: BMET stable, K 3.6 so will continue on supplementation. ? ?Asianae Minkler E. Maude Hettich, PharmD, BCACP, CPP ?[id number] [place], [place] [address] ?Phone: [phone]; Fax: [phone] ?03/05/2022 10:57 AM ? ? ?

## 2022-03-05 NOTE — Patient Instructions (Addendum)
Stop taking atorvastatin - monitor to see if your joint pain resolves ? ?In a week, start taking rosuvastatin 20mg  - 1 tablet daily ? ?We'll check your labs today - I'll call you tomorrow and let you know if you can stop your potassium supplement ? ?Keep your follow up with Dr next month ? ?

## 2022-03-06 LAB — BASIC METABOLIC PANEL
BUN/Creatinine Ratio: 17 (ref 9–20)
BUN: 17 mg/dL (ref 6–24)
CO2: 25 mmol/L (ref 20–29)
Calcium: 9.5 mg/dL (ref 8.7–10.2)
Chloride: 101 mmol/L (ref 96–106)
Creatinine, Ser: 1 mg/dL (ref 0.76–1.27)
Glucose: 135 mg/dL — ABNORMAL HIGH (ref 70–99)
Potassium: 3.6 mmol/L (ref 3.5–5.2)
Sodium: 143 mmol/L (ref 134–144)
eGFR: 87 mL/min/{1.73_m2} (ref >59)

## 2022-03-11 ENCOUNTER — Other Ambulatory Visit: Payer: BC Managed Care – PPO

## 2022-03-22 ENCOUNTER — Other Ambulatory Visit: Payer: BC Managed Care – PPO

## 2022-03-25 ENCOUNTER — Telehealth (HOSPITAL_COMMUNITY): Payer: Self-pay | Admitting: Vascular Surgery

## 2022-03-25 NOTE — Telephone Encounter (Signed)
Lvm to resch 4/21 @ 240 appt w/ db to 4/18 or 4/25, asked pt to call back  ?

## 2022-03-26 NOTE — Telephone Encounter (Signed)
2 nd attempt to contact pt to move 4/21 appt ?

## 2022-03-29 ENCOUNTER — Encounter (HOSPITAL_COMMUNITY): Payer: BC Managed Care – PPO | Admitting: Internal Medicine

## 2022-04-15 ENCOUNTER — Ambulatory Visit (HOSPITAL_COMMUNITY)
Admission: RE | Admit: 2022-04-15 | Discharge: 2022-04-15 | Disposition: A | Discharge status: Home / Self Care | Payer: BC Managed Care – PPO | Source: Ambulatory Visit | Attending: Internal Medicine | Admitting: Internal Medicine

## 2022-04-15 ENCOUNTER — Other Ambulatory Visit (HOSPITAL_COMMUNITY): Payer: Self-pay | Admitting: Internal Medicine

## 2022-04-15 ENCOUNTER — Encounter (HOSPITAL_COMMUNITY): Payer: Self-pay | Admitting: Internal Medicine

## 2022-04-15 VITALS — BP 124/80 | HR 82 | Wt 243.4 lb

## 2022-04-15 DIAGNOSIS — I493 Ventricular premature depolarization: Secondary | ICD-10-CM

## 2022-04-15 DIAGNOSIS — I110 Hypertensive heart disease with heart failure: Secondary | ICD-10-CM | POA: Diagnosis not present

## 2022-04-15 DIAGNOSIS — I480 Paroxysmal atrial fibrillation: Secondary | ICD-10-CM

## 2022-04-15 DIAGNOSIS — I428 Other cardiomyopathies: Secondary | ICD-10-CM | POA: Diagnosis not present

## 2022-04-15 DIAGNOSIS — G4733 Obstructive sleep apnea (adult) (pediatric): Secondary | ICD-10-CM | POA: Insufficient documentation

## 2022-04-15 DIAGNOSIS — E119 Type 2 diabetes mellitus without complications: Secondary | ICD-10-CM | POA: Diagnosis not present

## 2022-04-15 DIAGNOSIS — I5022 Chronic systolic (congestive) heart failure: Secondary | ICD-10-CM | POA: Insufficient documentation

## 2022-04-15 DIAGNOSIS — R002 Palpitations: Secondary | ICD-10-CM

## 2022-04-15 DIAGNOSIS — I519 Heart disease, unspecified: Secondary | ICD-10-CM

## 2022-04-15 LAB — MAGNESIUM: Magnesium: 2.1 mg/dL (ref 1.7–2.4)

## 2022-04-15 LAB — BASIC METABOLIC PANEL
Anion gap: 10 (ref 5–15)
BUN: 20 mg/dL (ref 6–20)
CO2: 26 mmol/L (ref 22–32)
Calcium: 9.5 mg/dL (ref 8.9–10.3)
Chloride: 103 mmol/L (ref 98–111)
Creatinine, Ser: 0.95 mg/dL (ref 0.61–1.24)
GFR, Estimated: >60 mL/min (ref >60)
Glucose, Bld: 136 mg/dL — ABNORMAL HIGH (ref 70–99)
Potassium: 3.3 mmol/L — ABNORMAL LOW (ref 3.5–5.1)
Sodium: 139 mmol/L (ref 135–145)

## 2022-04-15 NOTE — Progress Notes (Signed)
Zio patch placed onto patient.  All instructions and information reviewed with patient, they verbalize understanding with no questions. 

## 2022-04-15 NOTE — Patient Instructions (Signed)
Medication Changes: ? ?None.  ? ?Lab Work: ? ?Labs done today, your results will be available in MyChart, we will contact you for abnormal readings. ? ?GET YOUR BLOOD DRAWN 2-3 DAYS PRIOR TO YOUR CARDIAC MRI. Call our office to schedule a lab appointment.  ? ?Testing/Procedures: ? ?Your physician has requested that you have a cardiac MRI. Cardiac MRI uses a computer to create images of your heart as its beating, producing both still and moving pictures of your heart and major blood vessels. For further information please visit [email or web address] Please follow the instruction sheet given to you today for more information.  ? ?The radiology department will call you to schedule your test once approved by insurance. Get your blood drawn 2-3 days prior to testing---call our office to schedule a lab appointment.  ? ? ? ?Your provider has recommended that  you wear a Zio Patch for 14 days.  This monitor will record your heart rhythm for our review.  IF you have any symptoms while wearing the monitor please press the button.  If you have any issues with the patch or you notice a red or orange light on it please call the company at [phone].  Once you remove the patch please mail it back to the company as soon as possible so we can get the results. ? ? ?Special Instructions // Education: ? ?Do the following things EVERYDAY: ?Weigh yourself in the morning before breakfast. Write it down and keep it in a log. ?Take your medicines as prescribed ?Eat low salt foods--Limit salt (sodium) to 2000 mg per day.  ?Stay as active as you can everyday ?Limit all fluids for the day to less than 2 liters ? ? ?Follow-Up in: 2-3 months  ? ?At the [facility], you and your health needs are our priority. We have a designated team specialized in the treatment of Heart Failure. This Care Team includes your primary Heart Failure Specialized Cardiologist (physician), Advanced Practice Providers (APPs- Physician Assistants  and Nurse Practitioners), and Pharmacist who all work together to provide you with the care you need, when you need it.  ? ?You may see any of the following providers on your designated Care Team at your next follow up: ? ?Dr Glori Bickers ?Dr Loralie Champagne ?Darrick Grinder, NP ?Lyda Jester, PA ?Jessica Milford,NP ?Marlyce Huge, PA ?Audry Riles, PharmD ? ? ?Please be sure to bring in all your medications bottles to every appointment.  ? ?Need to Contact us: ? ?If you have any questions or concerns before your next appointment please send [place] a message through [place] or call our office at [phone].   ? ?TO LEAVE A MESSAGE FOR THE NURSE SELECT OPTION 2, PLEASE LEAVE A MESSAGE INCLUDING: ?YOUR NAME ?DATE OF BIRTH ?CALL BACK NUMBER ?REASON FOR CALL**this is important as we prioritize the call backs ? ?YOU WILL RECEIVE A CALL BACK THE SAME DAY AS LONG AS YOU CALL BEFORE 4:00 PM ? ? ?

## 2022-04-15 NOTE — Progress Notes (Signed)
ADVANCED HF CLINIC CONSULT NOTE  Referring Physician: Dr. Armanda Magic Primary Care: Richardean Chimera, MD Primary Cardiologist: Dr. Mayford Knife  HPI: Charles [name] is a 56 y/o male referred by Dr. Mayford Knife for further evaluation of his HF.   He has a h/o afib and ablation in 2020. Also h/o of  DM, HTN, LVH, LV dysfunction, OSA, not using CPAP recently. He underwent nuclear stress test in 1/23 which showed a small fixed defect with moderate reduction in uptake in the apical to basal inferior location and severe LV dysfunction with EF 26% with inferior infarct. 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    2D echo 01/2022 showed severe LV dysfunction with EF 20-25% with moderate RVE and moderate RV dysfunction, G3DD and mild AR.    R/L Cath  02/07/22 Minimal CAD LAD 20% RCA 20% with apparent akinesis of the anterior wall. EF 25-30%. Well compensated hemodynamics. Referred for cMRI. Of note EKG, 2/23 showed frequent PVCs.   He presents today for f/u. Notes chronic fatigue. Not compliant w/ CPAP. Dyspnea has improved w/ lasix. Denies resting dyspnea. No orthopnea/PND or LEE. NYHA Class II. Compliant w/ meds. SBPs have been soft at home, low 100s systolic. Notes occasional positional dizziness. Notes occasional ETOH use, social drinker but reports not heavy.    Review of Systems: [y] = yes, [ ]  = no   General: Weight gain [ ] ; Weight loss [ ] ; Anorexia [ ] ; Fatigue [ Y]; Fever [ ] ; Chills [ ] ; Weakness [ ]   Cardiac: Chest pain/pressure [ ] ; Resting SOB [ ] ; Exertional SOB [place] ]; Orthopnea [ ] ; Pedal Edema [ ] ; Palpitations [ ] ; Syncope [ ] ; Presyncope [ ] ; Paroxysmal nocturnal dyspnea[ ]   Pulmonary: Cough [ ] ; Wheezing[ ] ; Hemoptysis[ ] ; Sputum [ ] ; Snoring [ Y]  GI: Vomiting[ ] ; Dysphagia[ ] ; Melena[ ] ; Hematochezia [ ] ;  Heartburn[ ] ; Abdominal pain [ ] ; Constipation [ ] ; Diarrhea [ ] ; BRBPR [ ]   GU: Hematuria[ ] ; Dysuria [ ] ; Nocturia[ ]   Vascular: Pain in legs with walking [ ] ; Pain in feet with lying flat [ ] ; Non-healing sores [ ] ; Stroke [ ] ; TIA [ ] ; Slurred speech [ ] ;  Neuro: Headaches[ ] ; Vertigo[ ] ; Seizures[ ] ; Paresthesias[ ] ;Blurred vision [ ] ; Diplopia [ ] ; Vision changes [ ]   Ortho/Skin: Arthritis [ y]; Joint pain [ ] y; Muscle pain [ ] ; Joint swelling [ ] ; Back Pain [ ] ; Rash [ ]   Psych: Depression[ ] ; Anxiety[ ]   Heme: Bleeding problems [ ] ; Clotting disorders [ ] ; Anemia [ ]   Endocrine: Diabetes [ y]; Thyroid dysfunction[ ]    Past Medical History:  Diagnosis Date   Atrial fibrillation ([facility])    Chronic systolic dysfunction of left ventricle    Diabetes mellitus    A1c of 8% is an improvement from the previous level of 11%   Hyperlipidemia    Hypertension    LVH (left ventricular hypertrophy)    Echo 08/3012-mild LVH   Nonischemic cardiomyopathy ([facility])    39% - echo on 07/20/2011 -- EF of 45-50% and grade  2 diastolic dysfunction.   Obesity    Obstructive sleep apnea    noncompliant with CPAP   Peripheral vascular disease ([facility])     Current Outpatient Medications  Medication Sig Dispense Refill   APPLE CIDER  VINEGAR PO Take 450-900 mg by mouth See admin instructions. Take 2 tablets (900 mg) by mouth in the morning & 1 tablet (450 mg) by mouth in the evening.     cetirizine (ZYRTEC) 10 MG tablet Take 10 mg by mouth daily.      FARXIGA 10 MG TABS tablet Take 10 mg by mouth daily.   1   furosemide (LASIX) 40 MG tablet TAKE 1 TABLET BY MOUTH EVERY DAY 90 tablet 1   glipiZIDE (GLUCOTROL) 5 MG tablet Take 5 mg by mouth 2 (two) times daily before a meal.      meloxicam (MOBIC) 15 MG tablet Take 15 mg by mouth daily.     metFORMIN (GLUCOPHAGE-XR) 500 MG 24 hr tablet Take 1,000 mg by mouth 2 (two) times a day.      metoprolol succinate (TOPROL XL) 25 MG 24 hr tablet Take 1 tablet (25 mg total)  by mouth daily. 90 tablet 3   OZEMPIC, 0.25 OR 0.5 MG/DOSE, 2 MG/1.5ML SOPN Inject 1 mg into the skin every Wednesday.     potassium chloride (KLOR-CON) 10 MEQ tablet Take 1 tablet (10 mEq total) by mouth daily. 30 tablet 3   rosuvastatin (CRESTOR) 20 MG tablet Take 1 tablet (20 mg total) by mouth daily. 30 tablet 11   sacubitril-valsartan (ENTRESTO) 24-26 MG Take 1 tablet by mouth 2 (two) times daily. 60 tablet 11   spironolactone (ALDACTONE) 25 MG tablet Take 0.5 tablets (12.5 mg total) by mouth daily. 15 tablet 11   XARELTO 20 MG TABS tablet Take 20 mg by mouth daily.   6   No current facility-administered medications for this encounter.    Allergies  Allergen Reactions   Procaine Hcl Nausea And Vomiting    Per Pharmacy 09/09/2019, Okay for Lidocaine and Bupivicaine.      Social History   Socioeconomic History   Marital status: Divorced    Spouse name: Not on file   Number of children: Not on file   Years of education: Not on file   Highest education level: Not on file  Occupational History   Not on file  Tobacco Use   Smoking status: Never   Smokeless tobacco: Never  Vaping Use   Vaping Use: Never used  Substance and Sexual Activity   Alcohol use: Yes    Alcohol/week: 1.0 standard drink    Types: 1 Standard drinks or equivalent per week    Comment: seldom   Drug use: No   Sexual activity: Not on file  Other Topics Concern   Not on file  Social History Narrative   Lives in [place] 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,  Scientist, product/process development         Social Determinants of Corporate investment banker Strain: Not on file  Food Insecurity: Not on file  Transportation Needs: Not on file  Physical Activity: Not on file  Stress: Not on file  Social Connections: Not on file  Intimate Partner Violence: Not on file      Family History  Problem Relation Age of Onset   Hypertension Mother    Diabetes Mother    Alcohol abuse Father    CAD Father    Coronary artery disease Sister         with multiple stents   Coronary artery disease Other        in multiple family members   Hypertension Brother    Diabetes Sister    Diabetes Brother     Vitals:  04/15/22 1437  BP: 124/80  Pulse: 82  SpO2: 95%  Weight: 110.4 kg (243 lb 6.4 oz)    PHYSICAL EXAM: General:  Well appearing. No respiratory difficulty HEENT: normal Neck: supple. no JVD. Carotids 2+ bilat; no bruits. No lymphadenopathy or thryomegaly appreciated. Cor: PMI nondisplaced. Regular rate & rhythm. No rubs, gallops or murmurs. Lungs: clear Abdomen: soft, nontender, nondistended. No hepatosplenomegaly. No bruits or masses. Good bowel sounds. Extremities: no cyanosis, clubbing, rash, edema Neuro: alert & oriented x 3, cranial nerves grossly intact. moves all 4 extremities w/o difficulty. Affect pleasant.  ECG: NSR w/ 1PVC 86 bpm    ASSESSMENT & PLAN:  1. Chronic systolic HF due to NICM  - 2D echo 01/2022 showed severe LV dysfunction with EF 20-25% with moderate RVE and moderate RV dysfunction, G3DD mild AR.   - R/L Cath  02/07/22 Minimal CAD LAD 20% RCA 20% with apparent akinesis of the anterior wall. EF 25-30%. Well compensated hemodynamics.  - Arrange for cMRI  - ?PVC mediated CM. Arrange for 2 week zio to quantify burden  - NYHA II  - Volume status ok. Euvolemic on exam  - GDMT titration limited by soft BP and orthostasis. No changes today    - Continue Entresto 24/26 bid - Continue Farxiga 10 - Continue spiro 12.5 - Continue Toprol XL 25 mg daily  - Continue Lasix 40 mg daily    2. PAF - s/p previous ablation  - NSR on EKG today  - on Xarelto   3. PVCs - frequent PVCs noted on prior EKG 2/23 - EKG today w/ 1 PVC - ? Etiology of CM - plan 2 wk zio to quantify burden - Check BMP and Mg level today  - needs to improve compliance w/ CPAP  4. OSA - noncompliant w/ CPAP - needs to improve compliance   F/u in 3-4 wks w/ APP post Zio and cMRI  Robbie Lis, PA-C  Patient seen and  examined with the above-signed Advanced Practice Provider and/or Housestaff. I personally reviewed laboratory data, imaging studies and relevant notes. I independently examined the patient and formulated the important aspects of the plan. I have edited the note to reflect any of my changes or salient points. I have personally discussed the plan with the patient and/or family.  56 y/o male with OSA., PAF and systolic HF with EF  25-30%   Recent cath with minimal CAD but apparent akinesis of anterior wall. + PVCs.   Denies CP. Orthopnea has resolved. Mild DOE  General:  Well appearing. No resp difficulty HEENT: normal Neck: supple. no JVD. Carotids 2+ bilat; no bruits. No lymphadenopathy or thryomegaly appreciated. Cor: PMI nondisplaced. Regular rate & rhythm. No rubs, gallops or murmurs. Lungs: clear Abdomen: soft, nontender, nondistended. No hepatosplenomegaly. No bruits or masses. Good bowel sounds. Extremities: no cyanosis, clubbing, rash, edema Neuro: alert & orientedx3, cranial nerves grossly intact. moves all 4 extremities w/o difficulty. Affect pleasant  Doing well. NYHA I-II. On good GDMT. Etiology of LV dysfunction remains unclear. Will check Zio (? PVC cardiomyopathy) and cMRI.   Arvilla Meres, MD  4:04 PM

## 2022-04-22 ENCOUNTER — Telehealth (HOSPITAL_COMMUNITY): Payer: Self-pay | Admitting: *Deleted

## 2022-04-22 NOTE — Telephone Encounter (Signed)
Cmri auth pending ? ? ?

## 2022-04-26 ENCOUNTER — Other Ambulatory Visit (HOSPITAL_COMMUNITY): Payer: Self-pay | Admitting: *Deleted

## 2022-04-26 ENCOUNTER — Telehealth (HOSPITAL_COMMUNITY): Payer: Self-pay | Admitting: Surgery

## 2022-04-26 ENCOUNTER — Telehealth (HOSPITAL_COMMUNITY): Payer: Self-pay | Admitting: *Deleted

## 2022-04-26 DIAGNOSIS — I428 Other cardiomyopathies: Secondary | ICD-10-CM

## 2022-04-26 DIAGNOSIS — I519 Heart disease, unspecified: Secondary | ICD-10-CM

## 2022-04-26 MED ORDER — SPIRONOLACTONE 25 MG PO TABS: 25.0000 mg | ORAL_TABLET | Freq: Every day | ORAL | 3 refills | Status: DC

## 2022-04-26 MED ORDER — POTASSIUM CHLORIDE ER 20 MEQ PO TBCR: 10.0000 meq | EXTENDED_RELEASE_TABLET | Freq: Every day | ORAL | 3 refills | Status: DC

## 2022-04-26 NOTE — Telephone Encounter (Signed)
I attempted to contact patient regarding lab results and recommendations.  I left a message for a return call. 

## 2022-04-26 NOTE — Telephone Encounter (Signed)
Called patient per Dr. Gala Romney and based on recent labs asked him to increase potassium to 20 meq daily and increase spiro to 25 mg daily.  Patient will need repeat labs in 2 week. He asked for Monday 05/20/22 at 4pm - scheduled and ordered.  Pt will call us if any questions or concerns.  Hessie Diener RN Heart Failure [phone]

## 2022-04-26 NOTE — Telephone Encounter (Signed)
-----   Message from Jolaine Artist, MD sent at 04/21/2022 12:35 PM EDT ----- K is low. Please double daily kcl to 20 daily and also in crease spiro to 25 daily. Repeat BMET in 2 weeks. thanks

## 2022-05-01 ENCOUNTER — Other Ambulatory Visit (HOSPITAL_COMMUNITY): Payer: Self-pay

## 2022-05-01 ENCOUNTER — Telehealth (HOSPITAL_COMMUNITY): Payer: Self-pay | Admitting: Surgery

## 2022-05-01 DIAGNOSIS — I519 Heart disease, unspecified: Secondary | ICD-10-CM

## 2022-05-01 DIAGNOSIS — I428 Other cardiomyopathies: Secondary | ICD-10-CM

## 2022-05-01 MED ORDER — SPIRONOLACTONE 25 MG PO TABS: 25.0000 mg | ORAL_TABLET | Freq: Every day | ORAL | 3 refills | Status: DC

## 2022-05-01 MED ORDER — POTASSIUM CHLORIDE ER 20 MEQ PO TBCR: 20.0000 meq | EXTENDED_RELEASE_TABLET | Freq: Every day | ORAL | 3 refills | Status: DC

## 2022-05-01 NOTE — Telephone Encounter (Signed)
Patient called and asked that recently changed medications be resent to his pharmacy as he was unable to pick up medications.  Per Dr. Prescott Gum  previous order I have sent in Potassium 20 meq daily and Spironolactone 25 mg daily to his pharmacy of choice.

## 2022-05-08 ENCOUNTER — Telehealth (HOSPITAL_COMMUNITY): Payer: Self-pay | Admitting: Surgery

## 2022-05-08 NOTE — Telephone Encounter (Signed)
Patient called me regarding the fact that he received a denial letter for upcoming scheduled cMRI.  I spoke with Ceasar Lund CMA and she assures me that she has began the appeal process.  I called Charles [name] back to inform him of the status of the prior authorization.

## 2022-05-09 ENCOUNTER — Telehealth (HOSPITAL_COMMUNITY): Payer: Self-pay

## 2022-05-09 MED ORDER — AMIODARONE HCL 200 MG PO TABS: 200.0000 mg | ORAL_TABLET | Freq: Two times a day (BID) | ORAL | 3 refills | Status: DC

## 2022-05-09 NOTE — Telephone Encounter (Signed)
Patient advised and verbalized understanding. New Rx sent into patients pharmacy. Fu appt scheduled.   Meds ordered this encounter  Medications   amiodarone (PACERONE) 200 MG tablet    Sig: Take 1 tablet (200 mg total) by mouth 2 (two) times daily.    Dispense:  180 tablet    Refill:  3

## 2022-05-09 NOTE — Telephone Encounter (Signed)
-----   Message from Jolaine Artist, MD sent at 05/04/2022 11:35 AM EDT ----- Frequent PACs and PVCs. Start amiodarone 200 bid. Please schedule f/u with me 4-6 weeks.

## 2022-05-16 ENCOUNTER — Telehealth (HOSPITAL_COMMUNITY): Payer: Self-pay | Admitting: *Deleted

## 2022-05-20 ENCOUNTER — Other Ambulatory Visit: Payer: Self-pay | Admitting: Internal Medicine

## 2022-05-20 ENCOUNTER — Ambulatory Visit (HOSPITAL_COMMUNITY)
Admission: RE | Admit: 2022-05-20 | Discharge: 2022-05-20 | Disposition: A | Discharge status: Home / Self Care | Payer: BC Managed Care – PPO | Source: Ambulatory Visit | Attending: Internal Medicine | Admitting: Internal Medicine

## 2022-05-20 DIAGNOSIS — I519 Heart disease, unspecified: Secondary | ICD-10-CM | POA: Diagnosis present

## 2022-05-20 LAB — BASIC METABOLIC PANEL
Anion gap: 9 (ref 5–15)
BUN: 21 mg/dL — ABNORMAL HIGH (ref 6–20)
CO2: 25 mmol/L (ref 22–32)
Calcium: 9.7 mg/dL (ref 8.9–10.3)
Chloride: 106 mmol/L (ref 98–111)
Creatinine, Ser: 1.23 mg/dL (ref 0.61–1.24)
GFR, Estimated: >60 mL/min (ref >60)
Glucose, Bld: 177 mg/dL — ABNORMAL HIGH (ref 70–99)
Potassium: 4.2 mmol/L (ref 3.5–5.1)
Sodium: 140 mmol/L (ref 135–145)

## 2022-05-29 ENCOUNTER — Telehealth (HOSPITAL_COMMUNITY): Payer: Self-pay | Admitting: Emergency Medicine

## 2022-05-29 NOTE — Telephone Encounter (Signed)
Reaching out to patient to offer assistance regarding upcoming cardiac imaging study; pt verbalizes understanding of appt date/time, parking situation and where to check in, and verified current allergies; name and call back number provided for further questions should they arise Rockwell Alexandria RN Navigator Cardiac Imaging [facility] [phone] office [phone] cell  Denies claustro Denies metal  Denies iv issues Arrival 330 Holding diuretics

## 2022-05-29 NOTE — Telephone Encounter (Signed)
Attempted to call patient regarding upcoming cardiac MR appointment. Left message on voicemail with name and callback number Ismahan Lippman RN Navigator Cardiac Imaging [facility] [phone] Office [phone] Cell  

## 2022-05-30 ENCOUNTER — Ambulatory Visit (HOSPITAL_COMMUNITY)
Admission: RE | Admit: 2022-05-30 | Discharge: 2022-05-30 | Disposition: A | Discharge status: Home / Self Care | Payer: BC Managed Care – PPO | Source: Ambulatory Visit | Attending: Internal Medicine | Admitting: Internal Medicine

## 2022-05-30 DIAGNOSIS — I519 Heart disease, unspecified: Secondary | ICD-10-CM | POA: Diagnosis present

## 2022-05-30 MED ORDER — GADOBUTROL 1 MMOL/ML IV SOLN
10.0000 mL | Freq: Once | INTRAVENOUS | Status: AC | PRN
  Administered 2022-05-30: 10 mL via INTRAVENOUS

## 2022-06-27 ENCOUNTER — Encounter (HOSPITAL_COMMUNITY): Payer: Self-pay | Admitting: Internal Medicine

## 2022-06-27 ENCOUNTER — Ambulatory Visit (HOSPITAL_COMMUNITY)
Admission: RE | Admit: 2022-06-27 | Discharge: 2022-06-27 | Disposition: A | Discharge status: Home / Self Care | Payer: BC Managed Care – PPO | Source: Ambulatory Visit | Attending: Internal Medicine | Admitting: Internal Medicine

## 2022-06-27 VITALS — BP 130/80 | HR 71 | Wt 248.6 lb

## 2022-06-27 DIAGNOSIS — I471 Supraventricular tachycardia: Secondary | ICD-10-CM | POA: Diagnosis not present

## 2022-06-27 DIAGNOSIS — Z91199 Patient's noncompliance with other medical treatment and regimen due to unspecified reason: Secondary | ICD-10-CM | POA: Insufficient documentation

## 2022-06-27 DIAGNOSIS — I110 Hypertensive heart disease with heart failure: Secondary | ICD-10-CM | POA: Insufficient documentation

## 2022-06-27 DIAGNOSIS — E1151 Type 2 diabetes mellitus with diabetic peripheral angiopathy without gangrene: Secondary | ICD-10-CM | POA: Diagnosis not present

## 2022-06-27 DIAGNOSIS — Z7901 Long term (current) use of anticoagulants: Secondary | ICD-10-CM | POA: Diagnosis not present

## 2022-06-27 DIAGNOSIS — I2510 Atherosclerotic heart disease of native coronary artery without angina pectoris: Secondary | ICD-10-CM | POA: Diagnosis not present

## 2022-06-27 DIAGNOSIS — Z79899 Other long term (current) drug therapy: Secondary | ICD-10-CM | POA: Diagnosis not present

## 2022-06-27 DIAGNOSIS — I480 Paroxysmal atrial fibrillation: Secondary | ICD-10-CM | POA: Diagnosis not present

## 2022-06-27 DIAGNOSIS — I519 Heart disease, unspecified: Secondary | ICD-10-CM | POA: Diagnosis not present

## 2022-06-27 DIAGNOSIS — I428 Other cardiomyopathies: Secondary | ICD-10-CM | POA: Diagnosis present

## 2022-06-27 DIAGNOSIS — I5022 Chronic systolic (congestive) heart failure: Secondary | ICD-10-CM | POA: Insufficient documentation

## 2022-06-27 DIAGNOSIS — G4733 Obstructive sleep apnea (adult) (pediatric): Secondary | ICD-10-CM | POA: Diagnosis not present

## 2022-06-27 DIAGNOSIS — I493 Ventricular premature depolarization: Secondary | ICD-10-CM

## 2022-06-27 LAB — BASIC METABOLIC PANEL
Anion gap: 11 (ref 5–15)
BUN: 19 mg/dL (ref 6–20)
CO2: 26 mmol/L (ref 22–32)
Calcium: 9.9 mg/dL (ref 8.9–10.3)
Chloride: 104 mmol/L (ref 98–111)
Creatinine, Ser: 1.2 mg/dL (ref 0.61–1.24)
GFR, Estimated: >60 mL/min (ref >60)
Glucose, Bld: 143 mg/dL — ABNORMAL HIGH (ref 70–99)
Potassium: 4.6 mmol/L (ref 3.5–5.1)
Sodium: 141 mmol/L (ref 135–145)

## 2022-06-27 LAB — MAGNESIUM: Magnesium: 1.9 mg/dL (ref 1.7–2.4)

## 2022-06-27 LAB — BRAIN NATRIURETIC PEPTIDE: B Natriuretic Peptide: 70.8 pg/mL (ref 0.0–100.0)

## 2022-06-27 NOTE — Addendum Note (Signed)
Encounter addended by: Linda Hedges, RN on: 06/27/2022 4:23 PM  Actions taken: Clinical Note Signed

## 2022-06-27 NOTE — Progress Notes (Signed)
ADVANCED HF CLINIC NOTE  Referring Physician: Dr. Armanda Magic Primary Care: Richardean Chimera, MD Primary Cardiologist: Dr. Mayford Knife  HPI: Charles [name] is a 56 y/o male referred by Dr. Mayford Knife for further evaluation of his HF.   He has a h/o afib and ablation in 2020. Also h/o of  DM, HTN, LVH, LV dysfunction, OSA, not using CPAP recently. He underwent nuclear stress test in 1/23 which showed a small fixed defect with moderate reduction in uptake in the apical to basal inferior location and severe LV dysfunction with EF 26% with inferior infarct. 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    2D echo 01/2022 showed severe LV dysfunction with EF 20-25% with moderate RVE and moderate RV dysfunction, G3DD and mild AR.    R/L Cath  02/07/22 Minimal CAD LAD 20% RCA 20% with apparent akinesis of the anterior wall. EF 25-30%. Well compensated hemodynamics. Referred for cMRI. Of note EKG, 2/23 showed frequent PVCs.   At last visit we ordered cMRI and Zio to try to understand etiology of cardiomyopathy.  cMRI 05/30/22 1.LVEF = 37%. No LGE. RV normal Mild AI   Zio 5/23 1. 67 runs of NSVTl 141 runs of SVT 2. Frequent PACs 6.1% 3. Frequent PVCs 9.8%   He presents today for f/u. Remains very, very tired. Once he is up and around he does fine. No SOB, edema, orthopnea or PND. People tell him he snores. SBP at home 100-110   Past Medical History:  Diagnosis Date   Atrial fibrillation (HCC)    Chronic systolic dysfunction of left ventricle    Diabetes mellitus    A1c of 8% is an improvement from the previous level of 11%   Hyperlipidemia    Hypertension    LVH (left ventricular hypertrophy)    Echo 08/3012-mild LVH   Nonischemic cardiomyopathy (HCC)    39% - echo on 07/20/2011 -- EF of 45-50% and grade  2 diastolic  dysfunction.   Obesity    Obstructive sleep apnea    noncompliant with CPAP   Peripheral vascular disease (HCC)     Current Outpatient Medications  Medication Sig Dispense Refill   amiodarone (PACERONE) 200 MG tablet Take 1 tablet (200 mg total) by mouth 2 (two) times daily. 180 tablet 3   APPLE CIDER VINEGAR PO Take 450-900 mg by mouth See admin instructions. Take 2 tablets (900 mg) by mouth in the morning & 1 tablet (450 mg) by mouth in the evening.     cetirizine (ZYRTEC) 10 MG tablet Take 10 mg by mouth daily.      FARXIGA 10 MG TABS tablet Take 10 mg by mouth daily.   1   furosemide (LASIX) 40 MG tablet TAKE 1 TABLET BY MOUTH EVERY DAY 90 tablet 1   glipiZIDE (GLUCOTROL) 5 MG tablet Take 5 mg by mouth 2 (two) times daily before a meal.      meloxicam (MOBIC) 15 MG tablet Take 15 mg by mouth daily.     metFORMIN (GLUCOPHAGE-XR) 500 MG 24 hr tablet Take 1,000 mg by mouth 2 (two) times a day.      metoprolol succinate (TOPROL XL) 25 MG 24 hr tablet Take 1 tablet (25 mg total) by mouth daily. 90 tablet 3   OZEMPIC, 0.25 OR 0.5 MG/DOSE, 2 MG/1.5ML SOPN Inject  1 mg into the skin every Wednesday.     potassium chloride SA (KLOR-CON M) 20 MEQ tablet Take 20 mEq by mouth 2 (two) times daily.     rosuvastatin (CRESTOR) 20 MG tablet Take 1 tablet (20 mg total) by mouth daily. 30 tablet 11   sacubitril-valsartan (ENTRESTO) 24-26 MG Take 1 tablet by mouth 2 (two) times daily. 60 tablet 11   spironolactone (ALDACTONE) 25 MG tablet Take 1 tablet (25 mg total) by mouth daily. 90 tablet 3   XARELTO 20 MG TABS tablet Take 20 mg by mouth daily.   6   No current facility-administered medications for this encounter.    Allergies  Allergen Reactions   Procaine Hcl Nausea And Vomiting    Per Pharmacy 09/09/2019, Okay for Lidocaine and Bupivicaine.      Social History   Socioeconomic History   Marital status: Divorced    Spouse name: Not on file   Number of children: Not on file   Years of  education: Not on file   Highest education level: Not on file  Occupational History   Not on file  Tobacco Use   Smoking status: Never   Smokeless tobacco: Never  Vaping Use   Vaping Use: Never used  Substance and Sexual Activity   Alcohol use: Yes    Alcohol/week: 1.0 standard drink of alcohol    Types: 1 Standard drinks or equivalent per week    Comment: seldom   Drug use: No   Sexual activity: Not on file  Other Topics Concern   Not on file  Social History Narrative   Lives in [place] 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,  Scientist, product/process development         Social Determinants of Corporate investment banker Strain: Not on file  Food Insecurity: Not on file  Transportation Needs: Not on file  Physical Activity: Not on file  Stress: Not on file  Social Connections: Not on file  Intimate Partner Violence: Not on file      Family History  Problem Relation Age of Onset   Hypertension Mother    Diabetes Mother    Alcohol abuse Father    CAD Father    Coronary artery disease Sister        with multiple stents   Coronary artery disease Other        in multiple family members   Hypertension Brother    Diabetes Sister    Diabetes Brother     Vitals:   06/27/22 1530  BP: 130/80  Pulse: 71  SpO2: 96%  Weight: 112.8 kg (248 lb 9.6 oz)     PHYSICAL EXAM: General:  Well appearing. No resp difficulty HEENT: normal Neck: supple. no JVD. Carotids 2+ bilat; no bruits. No lymphadenopathy or thryomegaly appreciated. Cor: PMI nondisplaced. Regular rate & rhythm. No rubs, gallops or murmurs. Lungs: clear Abdomen: soft, nontender, nondistended. No hepatosplenomegaly. No bruits or masses. Good bowel sounds. Extremities: no cyanosis, clubbing, rash, edema Neuro: alert & orientedx3, cranial nerves grossly intact. moves all 4 extremities w/o difficulty. Affect pleasant   ASSESSMENT & PLAN:  1. Chronic systolic HF due to NICM  - 2D echo 01/2022 showed severe LV dysfunction with EF 20-25% with  moderate RVE and moderate RV dysfunction, G3DD mild AR.   - R/L Cath  02/07/22 Minimal CAD LAD 20% RCA 20% with apparent akinesis of the anterior wall. EF 25-30%. Well compensated hemodynamics.  - cMRI 05/30/22: LVEF = 37%. No LGE. 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 Amio started 6/23 - NYHA II - Volume status ok.  - GDMT titration limited by orthostasis.  - Continue Entresto 24/26 bid - Continue Farxiga 10 - Continue spiro 12.5 - Continue Toprol XL 25 mg daily  - Continue Lasix 40 mg daily   - REpeat echo in 2 months  2. PAF - s/p previous ablation  - Remains in NSR  - on Xarelto   3. 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 OSA - noncompliant w/ CPAP. Has not used in years -  Will do WatchPat   Arvilla Meres, MD  3:32 PM

## 2022-06-27 NOTE — Progress Notes (Signed)
ITAMAR home sleep study given to patient, all instructions explained, and CLOUDPAT registration complete.  

## 2022-06-27 NOTE — Progress Notes (Signed)
Height:     Weight: BMI:  Today's Date:  STOP BANG RISK ASSESSMENT S (snore) Have you been told that you snore?     YES   T (tired) Are you often tired, fatigued, or sleepy during the day?   YES  O (obstruction) Do you stop breathing, choke, or gasp during sleep? NO   P (pressure) Do you have or are you being treated for high blood pressure? YES   B (BMI) Is your body index greater than 35 kg/m? NO   A (age) Are you 58 years old or older? YES   N (neck) Do you have a neck circumference greater than 16 inches?   NO   G (gender) Are you a male? YES   TOTAL STOP/BANG "YES" ANSWERS 5                                                                       For Office Use Only              Procedure Order Form    YES to 3+ Stop Bang questions OR two clinical symptoms - patient qualifies for WatchPAT (CPT 95800)      Clinical Notes: Will consult Sleep Specialist and refer for management of therapy due to patient increased risk of Sleep Apnea. Ordering a sleep study due to the following two clinical symptoms: Excessive daytime sleepiness G47.10 / Gastroesophageal reflux K21.9 / Nocturia R35.1 / Morning Headaches G44.221 / Difficulty concentrating R41.840 / Memory problems or poor judgment G31.84 / Personality changes or irritability R45.4 / Loud snoring R06.83 / Depression F32.9 / Unrefreshed by sleep G47.8 / Impotence N52.9 / History of high blood pressure R03.0 / Insomnia G47.00       

## 2022-06-27 NOTE — Addendum Note (Signed)
Encounter addended by: Linda Hedges, RN on: 06/27/2022 4:04 PM  Actions taken: Order list changed, Diagnosis association updated, Charge Capture section accepted, Clinical Note Signed

## 2022-06-27 NOTE — Patient Instructions (Signed)
No changes to your medications.  Labs done today, your results will be available in MyChart, we will contact you for abnormal readings.  Your provider has recommended that you have a home sleep study.  We have provided you with the equipment in our office today. Please download the app and follow the instructions. YOUR PIN NUMBER IS: [id number]. Once you have completed the test you just dispose of the equipment, the information is automatically uploaded to [place] via blue-tooth technology. If your test is positive for sleep apnea and you need a home CPAP machine you will be contacted by Dr Norris Cross office [facility]) to set this up.  Your physician has requested that you have an echocardiogram. Echocardiography is a painless test that uses sound waves to create images of your heart. It provides your doctor with information about the size and shape of your heart and how well your heart's chambers and valves are working. This procedure takes approximately one hour. There are no restrictions for this procedure.  Your physician recommends that you schedule a follow-up appointment in: 2 months.  If you have any questions or concerns before your next appointment please send [place] a message through [place] or call our office at [phone].    TO LEAVE A MESSAGE FOR THE NURSE SELECT OPTION 2, PLEASE LEAVE A MESSAGE INCLUDING: YOUR NAME DATE OF BIRTH CALL BACK NUMBER REASON FOR CALL**this is important as we prioritize the call backs  YOU WILL RECEIVE A CALL BACK THE SAME DAY AS LONG AS YOU CALL BEFORE 4:00 PM  At the [facility], you and your health needs are our priority. As part of our continuing mission to provide you with exceptional heart care, we have created designated Provider Care Teams. These Care Teams include your primary Cardiologist (physician) and Advanced Practice Providers (APPs- Physician Assistants and Nurse Practitioners) who all work together to provide you with the care  you need, when you need it.   You may see any of the following providers on your designated Care Team at your next follow up: Dr Arvilla Meres Dr Carron Curie, NP Robbie Lis, [place] [facility] [place], [place] Karle Plumber, PharmD   Please be sure to bring in all your medications bottles to every appointment.

## 2022-06-27 NOTE — Addendum Note (Signed)
Encounter addended by: Suezanne Cheshire, RN on: 06/27/2022 4:23 PM  Actions taken: Clinical Note Signed

## 2022-07-05 ENCOUNTER — Other Ambulatory Visit (HOSPITAL_COMMUNITY): Payer: Self-pay | Admitting: Nurse Practitioner

## 2022-07-25 ENCOUNTER — Encounter (HOSPITAL_COMMUNITY): Payer: BC Managed Care – PPO | Admitting: Internal Medicine

## 2022-08-06 ENCOUNTER — Encounter (HOSPITAL_BASED_OUTPATIENT_CLINIC_OR_DEPARTMENT_OTHER): Payer: BC Managed Care – PPO | Admitting: Cardiology

## 2022-08-06 DIAGNOSIS — G4733 Obstructive sleep apnea (adult) (pediatric): Secondary | ICD-10-CM | POA: Diagnosis not present

## 2022-08-07 ENCOUNTER — Ambulatory Visit: Payer: BC Managed Care – PPO | Attending: Internal Medicine

## 2022-08-07 ENCOUNTER — Telehealth: Payer: Self-pay | Admitting: *Deleted

## 2022-08-07 DIAGNOSIS — G4733 Obstructive sleep apnea (adult) (pediatric): Secondary | ICD-10-CM

## 2022-08-07 NOTE — Telephone Encounter (Signed)
Left message to return a call to discuss sleep study results. 

## 2022-08-07 NOTE — Telephone Encounter (Signed)
-----   Message from Traci R Turner, MD sent at 08/07/2022 11:18 AM EDT ----- Please let patient know that they have sleep apnea and recommend treating with CPAP.  Please order an auto CPAP from 4-15cm H2O with heated humidity and mask of choice.  Order overnight pulse ox on CPAP.  Followup with me in 6 weeks.    

## 2022-08-07 NOTE — Procedures (Signed)
   SLEEP STUDY REPORT Patient Information Study Date: 08/06/22 Patient Name: Charles [name] Patient ID: [id number] Birth Date: Dec 02, 2066 Age: 56 Gender: Male Referring Physician: Nicholes Mango, MD  TEST DESCRIPTION: Home sleep apnea testing was completed using the WatchPat, a Type 1 device, utilizing  peripheral arterial tonometry (PAT), chest movement, actigraphy, pulse oximetry, pulse rate, body position and snore.  AHI was calculated with apnea and hypopnea using valid sleep time as the denominator. RDI includes apneas,  hypopneas, and RERAs. The data acquired and the scoring of sleep and all associated events were performed in  accordance with the recommended standards and specifications as outlined in the AASM Manual for the Scoring of  Sleep and Associated Events 2.2.0 (2015).  FINDINGS: 1. Moderate Obstructive Sleep Apnea with AHI 16.4/hr.  2. No Central Sleep Apnea with pAHIc 0.7/hr. 3. Oxygen desaturations as low as 83%. 4. Mild snoring was present. O2 sats were < 88% for 1.1 min. 5. Total sleep time was 7 hrs and 38 min. 6. 33.3% of total sleep time was spent in REM sleep.  7. Normal sleep onset latency at 17 min 8. Shortened REM sleep onset latency at 34 min.  9. Total awakenings were 4.   DIAGNOSIS:  Moderate Obstructive Sleep Apnea (G47.33)  RECOMMENDATIONS: 1. Clinical correlation of these findings is necessary. The decision to treat obstructive sleep apnea (OSA) is usually  based on the presence of apnea symptoms or the presence of associated medical conditions such as Hypertension,  Congestive Heart Failure, Atrial Fibrillation or Obesity. The most common symptoms of OSA are snoring, gasping for  breath while sleeping, daytime sleepiness and fatigue.   2. Initiating apnea therapy is recommended given the presence of symptoms and/or associated conditions.  Recommend proceeding with one of the following:   a. Auto-CPAP therapy with a pressure range of 5-20cm  H2O.   b. An oral appliance (OA) that can be obtained from certain dentists with expertise in sleep medicine. These are  primarily of use in non-obese patients with mild and moderate disease.   c. An ENT consultation which may be useful to look for specific causes of obstruction and possible treatment  options.   d. If patient is intolerant to PAP therapy, consider referral to ENT for evaluation for hypoglossal nerve stimulator.   3. Close follow-up is necessary to ensure success with CPAP or oral appliance therapy for maximum benefit .  4. A follow-up oximetry study on CPAP is recommended to assess the adequacy of therapy and determine the need  for supplemental oxygen or the potential need for Bi-level therapy. An arterial blood gas to determine the adequacy of  baseline ventilation and oxygenation should also be considered.  5. Healthy sleep recommendations include: adequate nightly sleep (normal 7-9 hrs/night), avoidance of caffeine after  noon and alcohol near bedtime, and maintaining a sleep environment that is cool, dark and quiet.  6. Weight loss for overweight patients is recommended. Even modest amounts of weight loss can significantly  improve the severity of sleep apnea.  7. Snoring recommendations include: weight loss where appropriate, side sleeping, and avoidance of alcohol before  bed.  8. Operation of motor vehicle should not be performed when sleepy.  Signature: Armanda Magic, MD; [facility]; Diplomat, American Board of Sleep  Medicine Electronically Signed: 08/07/22

## 2022-08-13 ENCOUNTER — Other Ambulatory Visit: Payer: Self-pay | Admitting: Cardiology

## 2022-08-13 DIAGNOSIS — G4733 Obstructive sleep apnea (adult) (pediatric): Secondary | ICD-10-CM

## 2022-08-13 NOTE — Telephone Encounter (Signed)
-----   Message from Quintella Reichert, MD sent at 08/07/2022 11:18 AM EDT ----- Please let patient know that they have sleep apnea and recommend treating with CPAP.  Please order an auto CPAP from 4-15cm H2O with heated humidity and mask of choice.  Order overnight pulse ox on CPAP.  Followup with me in 6 weeks.

## 2022-08-13 NOTE — Telephone Encounter (Signed)
Patient returned a call to me and was given sleep study results and recommendations. He agrees to proceed with CPAP treatment and has no questions at this time. Orders sent to [facility].

## 2022-08-15 ENCOUNTER — Telehealth: Payer: Self-pay | Admitting: *Deleted

## 2022-08-15 NOTE — Telephone Encounter (Signed)
[facility] called the patient and learned he has a fairly new S11 cpap machine set up 11/07/20 and was not eligible for a new machine so [facility] put in an order for settings change order and supply order and will take of the patient.

## 2022-08-25 ENCOUNTER — Other Ambulatory Visit: Payer: Self-pay | Admitting: Internal Medicine

## 2022-08-26 ENCOUNTER — Other Ambulatory Visit: Payer: Self-pay

## 2022-08-26 NOTE — Telephone Encounter (Signed)
Refill requests received for Toprol XL 15mg  AND 50mg . Per notes from [facility] patient should be on Toprol XL 25mg . Refill sent for this strength and refused for the 50mg .

## 2022-09-12 ENCOUNTER — Encounter (HOSPITAL_COMMUNITY): Payer: Self-pay | Admitting: Internal Medicine

## 2022-09-12 ENCOUNTER — Ambulatory Visit (HOSPITAL_BASED_OUTPATIENT_CLINIC_OR_DEPARTMENT_OTHER)
Admission: RE | Admit: 2022-09-12 | Discharge: 2022-09-12 | Disposition: A | Discharge status: Home / Self Care | Payer: BC Managed Care – PPO | Source: Ambulatory Visit | Attending: Internal Medicine | Admitting: Internal Medicine

## 2022-09-12 ENCOUNTER — Other Ambulatory Visit (HOSPITAL_COMMUNITY): Payer: Self-pay | Admitting: Internal Medicine

## 2022-09-12 ENCOUNTER — Ambulatory Visit (HOSPITAL_COMMUNITY)
Admission: RE | Admit: 2022-09-12 | Discharge: 2022-09-12 | Disposition: A | Discharge status: Home / Self Care | Payer: BC Managed Care – PPO | Source: Ambulatory Visit | Attending: Internal Medicine | Admitting: Internal Medicine

## 2022-09-12 ENCOUNTER — Other Ambulatory Visit: Payer: Self-pay | Admitting: Internal Medicine

## 2022-09-12 VITALS — BP 100/60 | HR 75 | Wt 243.6 lb

## 2022-09-12 DIAGNOSIS — Z79899 Other long term (current) drug therapy: Secondary | ICD-10-CM | POA: Insufficient documentation

## 2022-09-12 DIAGNOSIS — I2510 Atherosclerotic heart disease of native coronary artery without angina pectoris: Secondary | ICD-10-CM | POA: Insufficient documentation

## 2022-09-12 DIAGNOSIS — I519 Heart disease, unspecified: Secondary | ICD-10-CM

## 2022-09-12 DIAGNOSIS — G4733 Obstructive sleep apnea (adult) (pediatric): Secondary | ICD-10-CM | POA: Diagnosis not present

## 2022-09-12 DIAGNOSIS — I428 Other cardiomyopathies: Secondary | ICD-10-CM | POA: Insufficient documentation

## 2022-09-12 DIAGNOSIS — I110 Hypertensive heart disease with heart failure: Secondary | ICD-10-CM | POA: Diagnosis present

## 2022-09-12 DIAGNOSIS — Z7901 Long term (current) use of anticoagulants: Secondary | ICD-10-CM | POA: Insufficient documentation

## 2022-09-12 DIAGNOSIS — I480 Paroxysmal atrial fibrillation: Secondary | ICD-10-CM | POA: Insufficient documentation

## 2022-09-12 DIAGNOSIS — I5022 Chronic systolic (congestive) heart failure: Secondary | ICD-10-CM

## 2022-09-12 DIAGNOSIS — Z7984 Long term (current) use of oral hypoglycemic drugs: Secondary | ICD-10-CM | POA: Insufficient documentation

## 2022-09-12 DIAGNOSIS — E669 Obesity, unspecified: Secondary | ICD-10-CM | POA: Diagnosis not present

## 2022-09-12 DIAGNOSIS — Q231 Congenital insufficiency of aortic valve: Secondary | ICD-10-CM | POA: Diagnosis not present

## 2022-09-12 DIAGNOSIS — E785 Hyperlipidemia, unspecified: Secondary | ICD-10-CM | POA: Insufficient documentation

## 2022-09-12 DIAGNOSIS — I493 Ventricular premature depolarization: Secondary | ICD-10-CM | POA: Insufficient documentation

## 2022-09-12 DIAGNOSIS — E119 Type 2 diabetes mellitus without complications: Secondary | ICD-10-CM | POA: Diagnosis not present

## 2022-09-12 DIAGNOSIS — Z6830 Body mass index (BMI) 30.0-30.9, adult: Secondary | ICD-10-CM | POA: Insufficient documentation

## 2022-09-12 LAB — ECHOCARDIOGRAM COMPLETE
AR max vel: 3.54 cm2
AV Area VTI: 3.32 cm2
AV Area mean vel: 3.28 cm2
AV Mean grad: 2 mmHg
AV Peak grad: 4.2 mmHg
Ao pk vel: 1.03 m/s
Area-P 1/2: 2.62 cm2
Calc EF: 44.1 %
MV VTI: 2.15 cm2
P 1/2 time: 425 msec
S' Lateral: 3.4 cm
Single Plane A2C EF: 43.6 %
Single Plane A4C EF: 43.1 %

## 2022-09-12 LAB — BASIC METABOLIC PANEL
Anion gap: 12 (ref 5–15)
BUN: 17 mg/dL (ref 6–20)
CO2: 27 mmol/L (ref 22–32)
Calcium: 9.7 mg/dL (ref 8.9–10.3)
Chloride: 101 mmol/L (ref 98–111)
Creatinine, Ser: 1.3 mg/dL — ABNORMAL HIGH (ref 0.61–1.24)
GFR, Estimated: >60 mL/min (ref >60)
Glucose, Bld: 171 mg/dL — ABNORMAL HIGH (ref 70–99)
Potassium: 4.6 mmol/L (ref 3.5–5.1)
Sodium: 140 mmol/L (ref 135–145)

## 2022-09-12 MED ORDER — AMIODARONE HCL 200 MG PO TABS: 100.0000 mg | ORAL_TABLET | Freq: Every day | ORAL | 3 refills | Status: DC

## 2022-09-12 MED ORDER — FUROSEMIDE 40 MG PO TABS: 40.0000 mg | ORAL_TABLET | ORAL | 3 refills | Status: DC | PRN

## 2022-09-12 NOTE — Progress Notes (Signed)
ADVANCED HF CLINIC NOTE  Referring Physician: Dr. Armanda Magic Primary Care: Richardean Chimera, MD Primary Cardiologist: Dr. Mayford Knife  HPI: Charles [name] is a 56 y/o male with a  h/o afib and ablation in 2020. Also h/o of  DM, HTN, LVH, LV dysfunction, OSA, not using CPAP recently. He underwent nuclear stress test in 1/23 which showed a small fixed defect with moderate reduction in uptake in the apical to basal inferior location and severe LV dysfunction with EF 26% with inferior infarct. 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    2D echo 01/2022 showed severe LV dysfunction with EF 20-25% with moderate RVE and moderate RV dysfunction, G3DD and mild AR.    R/L Cath  02/07/22 Minimal CAD LAD 20% RCA 20% with apparent akinesis of the anterior wall. EF 25-30%. Well compensated hemodynamics. Referred for cMRI. Of note EKG, 2/23 showed frequent PVCs.   cMRI 05/30/22 1.LVEF = 37%. No LGE. RV normal Mild AI   Zio 5/23 1. 67 runs of NSVTl 141 runs of SVT 2. Frequent PACs 6.1% 3. Frequent PVCs 9.8%   Sleep Study 08/06/22  AHI 16.4 Oxygen desaturations as low as 83%.   Today he returns for HF follow up.Overall feeling fine. Denies SOB/PND/Orthopnea. No difficulty with steps. Walks all day at his job.  Appetite ok. No fever or chills. Weight at home 235-237 pounds. Using CPAP 4 hours per night. Taking all medications.   Past Medical History:  Diagnosis Date   Atrial fibrillation (HCC)    Chronic systolic dysfunction of left ventricle    Diabetes mellitus    A1c of 8% is an improvement from the previous level of 11%   Hyperlipidemia    Hypertension    LVH (left ventricular hypertrophy)    Echo 08/3012-mild LVH   Nonischemic cardiomyopathy (HCC)    39% - echo on 07/20/2011 -- EF of 45-50% and grade  2 diastolic  dysfunction.   Obesity    Obstructive sleep apnea    noncompliant with CPAP   Peripheral vascular disease (HCC)     Current Outpatient Medications  Medication Sig Dispense Refill   amiodarone (PACERONE) 200 MG tablet Take 1 tablet (200 mg total) by mouth 2 (two) times daily. 180 tablet 3   APPLE CIDER VINEGAR PO Take 450-900 mg by mouth See admin instructions. Take 2 tablets (900 mg) by mouth in the morning & 1 tablet (450 mg) by mouth in the evening.     cetirizine (ZYRTEC) 10 MG tablet Take 10 mg by mouth daily.      FARXIGA 10 MG TABS tablet Take 10 mg by mouth daily.   1   furosemide (LASIX) 40 MG tablet Take 1 tablet (40 mg total) by mouth daily. Take one tablet by mouth once daily, needs f/u appt for further refills [id number] 90 tablet 0   glipiZIDE (GLUCOTROL) 5 MG tablet Take 5 mg by mouth 2 (two) times daily before a meal.      meloxicam (MOBIC) 15 MG tablet Take 15 mg by mouth daily.     metFORMIN (GLUCOPHAGE-XR) 500 MG 24 hr tablet Take 1,000 mg by mouth 2 (two) times a day.      metoprolol succinate (TOPROL-XL) 25 MG 24 hr tablet TAKE 1 TABLET (25 MG TOTAL) BY MOUTH DAILY. 90 tablet 3  OZEMPIC, 1 MG/DOSE, 4 MG/3ML SOPN Inject 1 mg into the skin once a week.     Potassium Chloride ER 20 MEQ TBCR Take 1 tablet by mouth daily.     rosuvastatin (CRESTOR) 20 MG tablet Take 1 tablet (20 mg total) by mouth daily. 30 tablet 11   sacubitril-valsartan (ENTRESTO) 24-26 MG Take 1 tablet by mouth 2 (two) times daily. 60 tablet 11   spironolactone (ALDACTONE) 25 MG tablet Take 1 tablet (25 mg total) by mouth daily. 90 tablet 3   XARELTO 20 MG TABS tablet Take 20 mg by mouth daily.   6   No current facility-administered medications for this encounter.    Allergies  Allergen Reactions   Procaine Hcl Nausea And Vomiting    Per Pharmacy 09/09/2019, Okay for Lidocaine and Bupivicaine.      Social History   Socioeconomic History   Marital status: Divorced    Spouse name: Not on file    Number of children: Not on file   Years of education: Not on file   Highest education level: Not on file  Occupational History   Not on file  Tobacco Use   Smoking status: Never   Smokeless tobacco: Never  Vaping Use   Vaping Use: Never used  Substance and Sexual Activity   Alcohol use: Yes    Alcohol/week: 1.0 standard drink of alcohol    Types: 1 Standard drinks or equivalent per week    Comment: seldom   Drug use: No   Sexual activity: Not on file  Other Topics Concern   Not on file  Social History Narrative   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,  Engineer, manufacturing systems         Social Determinants of Radio broadcast assistant Strain: Not on file  Food Insecurity: Not on file  Transportation Needs: Not on file  Physical Activity: Not on file  Stress: Not on file  Social Connections: Not on file  Intimate Partner Violence: Not on file      Family History  Problem Relation Age of Onset   Hypertension Mother    Diabetes Mother    Alcohol abuse Father    CAD Father    Coronary artery disease Sister        with multiple stents   Coronary artery disease Other        in multiple family members   Hypertension Brother    Diabetes Sister    Diabetes Brother     Vitals:   09/12/22 1447  BP: 100/60  Pulse: 75  SpO2: 98%  Weight: 110.5 kg (243 lb 9.6 oz)    Wt Readings from Last 3 Encounters:  09/12/22 110.5 kg (243 lb 9.6 oz)  06/27/22 112.8 kg (248 lb 9.6 oz)  04/15/22 110.4 kg (243 lb 6.4 oz)     PHYSICAL EXAM: General:  Well appearing. No resp difficulty HEENT: normal Neck: supple. no JVD. Carotids 2+ bilat; no bruits. No lymphadenopathy or thryomegaly appreciated. Cor: PMI nondisplaced. Regular rate & rhythm. No rubs, gallops or murmurs. Lungs: clear Abdomen: soft, nontender, nondistended. No hepatosplenomegaly. No bruits or masses. Good bowel sounds. Extremities: no cyanosis, clubbing, rash, edema Neuro: alert & orientedx3, cranial nerves grossly  intact. moves all 4 extremities w/o difficulty. Affect pleasant  EKG: SR 75 bmp personally checked.   ASSESSMENT & PLAN:  1. Chronic systolic HF due to NICM  - ECho 2023 showed severe LV dysfunction with EF 20-25% with moderate RVE and moderate  RV dysfunction, G3DD mild AR.   - R/L Cath  02/07/22 Minimal CAD LAD 20% RCA 20% with apparent akinesis of the anterior wall. EF 25-30%. Well compensated hemodynamics.  - cMRI 05/30/22: LVEF = 37%. No LGE. RV normal Mild AI. No evidence of infiltrative disease.  - Zio 5/23 -67 runs of NSVTl 141 runs of SVT,  Frequent PACs 6.1%, Frequent PVCs 9.8% - Suspect PVC CM. Amio started 6/23 -Echo today EF improved 60-65%  moderate LVH. Discussed and reviewed by Dr Gala Romney  - GDMT titration limited by orthostasis.  - Reds Clip 36%. Dizziness is not as bad.  - NYHA I.  Volume status stable - Continue Entresto 24/26 bid - Continue Farxiga 10 - Continue spiro 25 mg daily  - Continue Toprol XL 25 mg daily  - Continue Lasix 40 mg daily    2. PAF - s/p previous ablation  - Maintaining SR.  - on Xarelto   3. PVCs - frequent PVCs noted on prior EKG 2/23 - Zio 5/23 9.8% PVCs - suspect PVC associated CM  - PVC appeared to be suppressed.  - Cut back amio to 100 mg daily. Anticipate stopping in 3 months. If PVCs reoccurs will send to EP for AA for possible ablation.    4. OSA, moderate  - noncompliant w/ CPAP. Has not used in years -  Had repeat sleep study.Mod obstructive sleep apnea with AHI 16.4/hour.  - He has a CPAP and has been using for about 4 hours at a time.   5. Obesity  Body mass index is 30.45 kg/m. On Ozempic    Follow in 3 months. Check BMET    Tonye Becket, NP  2:56 PM  Patient seen and examined with the above-signed Advanced Practice Provider and/or Housestaff. I personally reviewed laboratory data, imaging studies and relevant notes. I independently examined the patient and formulated the important aspects of the plan. I have edited  the note to reflect any of my changes or salient points. I have personally discussed the plan with the patient and/or family.  Feels great. Echo today shows normalization of LV function with PVC suppression. Having some nausea  General:  Well appearing. No resp difficulty HEENT: normal Neck: supple. no JVD. Carotids 2+ bilat; no bruits. No lymphadenopathy or thryomegaly appreciated. Cor: PMI nondisplaced. Regular rate & rhythm. No rubs, gallops or murmurs. Lungs: clear Abdomen: soft, nontender, nondistended. No hepatosplenomegaly. No bruits or masses. Good bowel sounds. Extremities: no cyanosis, clubbing, rash, edema Neuro: alert & orientedx3, cranial nerves grossly intact. moves all 4 extremities w/o difficulty. Affect pleasant  EF back to normal with PVC suppression. Will begin to wean amio. Drop to 100 daily. If no PVCs at next visit will stop amio. Three months after stopping amio will place Zio. If PVCs do not return in setting of treat OSA will just follow. If PVCs return consider mexilitene or flecainide vs ablation.  Arvilla Meres, MD  3:50 PM

## 2022-09-12 NOTE — Patient Instructions (Addendum)
Decrease Amiodarone to 100mg  ( 1/2 Tab) Daily  Take Lasix as needed for weight gain of 3lb in 24 hours or 5lb in a week   Labs done today, your results will be available in MyChart, we will contact you for abnormal readings.  Your physician recommends that you schedule a follow-up appointment in: 3 months   If you have any questions or concerns before your next appointment please send [place] a message through [place] or call our office at [phone].    TO LEAVE A MESSAGE FOR THE NURSE SELECT OPTION 2, PLEASE LEAVE A MESSAGE INCLUDING: YOUR NAME DATE OF BIRTH CALL BACK NUMBER REASON FOR CALL**this is important as we prioritize the call backs  YOU WILL RECEIVE A CALL BACK THE SAME DAY AS LONG AS YOU CALL BEFORE 4:00 PM  At the [facility], you and your health needs are our priority. As part of our continuing mission to provide you with exceptional heart care, we have created designated Provider Care Teams. These Care Teams include your primary Cardiologist (physician) and Advanced Practice Providers (APPs- Physician Assistants and Nurse Practitioners) who all work together to provide you with the care you need, when you need it.   You may see any of the following providers on your designated Care Team at your next follow up: Dr Glori Bickers Dr Loralie Champagne Dr. Roxana Hires, NP Lyda Jester, [place] [facility] [place], [place] Forestine Na, NP Audry Riles, PharmD   Please be sure to bring in all your medications bottles to every appointment.

## 2022-09-12 NOTE — Progress Notes (Signed)
  Echocardiogram 2D Echocardiogram has been performed.  Charles [name] 09/12/2022, 2:30 PM

## 2022-09-12 NOTE — Progress Notes (Signed)
ReDS Vest / Clip - 09/12/22 1500       ReDS Vest / Clip   Station Marker D    Ruler Value 36    ReDS Value Range Moderate volume overload    ReDS Actual Value 36

## 2022-09-26 ENCOUNTER — Other Ambulatory Visit: Payer: Self-pay | Admitting: Internal Medicine

## 2022-09-26 ENCOUNTER — Other Ambulatory Visit (HOSPITAL_COMMUNITY): Payer: Self-pay | Admitting: Internal Medicine

## 2022-10-26 ENCOUNTER — Other Ambulatory Visit: Payer: Self-pay | Admitting: Internal Medicine

## 2022-10-26 ENCOUNTER — Other Ambulatory Visit (HOSPITAL_COMMUNITY): Payer: Self-pay | Admitting: Cardiovascular Disease

## 2022-12-13 ENCOUNTER — Encounter (HOSPITAL_COMMUNITY): Payer: Self-pay

## 2022-12-13 ENCOUNTER — Ambulatory Visit (HOSPITAL_COMMUNITY)
Admission: RE | Admit: 2022-12-13 | Discharge: 2022-12-13 | Disposition: A | Discharge status: Home / Self Care | Payer: BC Managed Care – PPO | Source: Ambulatory Visit | Attending: Family Medicine | Admitting: Family Medicine

## 2022-12-13 VITALS — BP 112/78 | HR 90 | Wt 242.0 lb

## 2022-12-13 DIAGNOSIS — I480 Paroxysmal atrial fibrillation: Secondary | ICD-10-CM | POA: Diagnosis not present

## 2022-12-13 DIAGNOSIS — I428 Other cardiomyopathies: Secondary | ICD-10-CM | POA: Diagnosis not present

## 2022-12-13 DIAGNOSIS — I110 Hypertensive heart disease with heart failure: Secondary | ICD-10-CM | POA: Insufficient documentation

## 2022-12-13 DIAGNOSIS — Z79899 Other long term (current) drug therapy: Secondary | ICD-10-CM | POA: Insufficient documentation

## 2022-12-13 DIAGNOSIS — Z6830 Body mass index (BMI) 30.0-30.9, adult: Secondary | ICD-10-CM | POA: Insufficient documentation

## 2022-12-13 DIAGNOSIS — I493 Ventricular premature depolarization: Secondary | ICD-10-CM

## 2022-12-13 DIAGNOSIS — Z7985 Long-term (current) use of injectable non-insulin antidiabetic drugs: Secondary | ICD-10-CM | POA: Diagnosis not present

## 2022-12-13 DIAGNOSIS — E1151 Type 2 diabetes mellitus with diabetic peripheral angiopathy without gangrene: Secondary | ICD-10-CM | POA: Diagnosis not present

## 2022-12-13 DIAGNOSIS — I2510 Atherosclerotic heart disease of native coronary artery without angina pectoris: Secondary | ICD-10-CM | POA: Insufficient documentation

## 2022-12-13 DIAGNOSIS — Z7901 Long term (current) use of anticoagulants: Secondary | ICD-10-CM | POA: Diagnosis not present

## 2022-12-13 DIAGNOSIS — E669 Obesity, unspecified: Secondary | ICD-10-CM | POA: Insufficient documentation

## 2022-12-13 DIAGNOSIS — I5022 Chronic systolic (congestive) heart failure: Secondary | ICD-10-CM | POA: Diagnosis not present

## 2022-12-13 DIAGNOSIS — I471 Supraventricular tachycardia, unspecified: Secondary | ICD-10-CM | POA: Diagnosis not present

## 2022-12-13 DIAGNOSIS — G4733 Obstructive sleep apnea (adult) (pediatric): Secondary | ICD-10-CM | POA: Diagnosis not present

## 2022-12-13 LAB — COMPREHENSIVE METABOLIC PANEL
ALT: 16 U/L (ref 0–44)
AST: 17 U/L (ref 15–41)
Albumin: 4.2 g/dL (ref 3.5–5.0)
Alkaline Phosphatase: 40 U/L (ref 38–126)
Anion gap: 11 (ref 5–15)
BUN: 19 mg/dL (ref 6–20)
CO2: 24 mmol/L (ref 22–32)
Calcium: 9.1 mg/dL (ref 8.9–10.3)
Chloride: 99 mmol/L (ref 98–111)
Creatinine, Ser: 1.33 mg/dL — ABNORMAL HIGH (ref 0.61–1.24)
GFR, Estimated: >60 mL/min (ref >60)
Glucose, Bld: 163 mg/dL — ABNORMAL HIGH (ref 70–99)
Potassium: 4.6 mmol/L (ref 3.5–5.1)
Sodium: 134 mmol/L — ABNORMAL LOW (ref 135–145)
Total Bilirubin: 0.8 mg/dL (ref 0.3–1.2)
Total Protein: 7.3 g/dL (ref 6.5–8.1)

## 2022-12-13 LAB — TSH: TSH: 2.286 u[IU]/mL (ref 0.350–4.500)

## 2022-12-13 LAB — BRAIN NATRIURETIC PEPTIDE: B Natriuretic Peptide: 29.7 pg/mL (ref 0.0–100.0)

## 2022-12-13 NOTE — Progress Notes (Signed)
ADVANCED HF CLINIC NOTE   Primary Care: Caryl Bis, MD Primary Cardiologist: Dr. Radford Pax HF Cardiologist: Dr. Haroldine Laws  HPI: Charles [name] is a 57 y.o. male with a  h/o afib and ablation in 2020. Also h/o of  DM, HTN, LVH, LV dysfunction, OSA, not using CPAP recently. He underwent nuclear stress test in 1/23 which showed a small fixed defect with moderate reduction in uptake in the apical to basal inferior location and severe LV dysfunction with EF 26% with inferior infarct. 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    Echo 2/23 showed severe LV dysfunction with EF 20-25% with moderate RVE and moderate RV dysfunction, G3DD and mild AR.    R/L Cath  02/07/22 Minimal CAD LAD 20% RCA 20% with apparent akinesis of the anterior wall. EF 25-30%. Well compensated hemodynamics. Referred for cMRI. Of note EKG, 2/23 showed frequent PVCs.   cMRI 05/30/22 1.LVEF = 37%. No LGE. RV normal Mild AI   Zio 5/23 1. 67 runs of NSVTl 141 runs of SVT 2. Frequent PACs 6.1% 3. Frequent PVCs 9.8%   Sleep Study 08/06/22  AHI 16.4 Oxygen desaturations as low as 83%.   Echo 10/23 showed EF 55-60%, RV ok. PVCs suppressed, began amio taper.  Today he returns for HF follow up. Overall feeling fine. He works full time as a Health visitor, physically active job. He does not have SOB at work. Denies palpitations, abnormal bleeding, CP, dizziness, edema, or PND/Orthopnea. Appetite ok. No fever or chills. Weight at home 232 pounds. Taking all medications. Wears CPAP for half the night. No ETOH or tobacco use.  Past Medical History:  Diagnosis Date   Atrial fibrillation ([place])    Chronic systolic dysfunction of left ventricle    Diabetes mellitus    A1c of 8% is an improvement from the previous level of 11%   Hyperlipidemia     Hypertension    LVH (left ventricular hypertrophy)    Echo 08/3012-mild LVH   Nonischemic cardiomyopathy ([place])    39% - echo on 07/20/2011 -- EF of 45-50% and grade  2 diastolic dysfunction.   Obesity    Obstructive sleep apnea    noncompliant with CPAP   Peripheral vascular disease (HCC)    Current Outpatient Medications  Medication Sig Dispense Refill   amiodarone (PACERONE) 200 MG tablet Take 0.5 tablets (100 mg total) by mouth daily. 180 tablet 3   APPLE CIDER VINEGAR PO Take 450-900 mg by mouth See admin instructions. Take 2 tablets (900 mg) by mouth in the morning & 1 tablet (450 mg) by mouth in the evening.     cetirizine (ZYRTEC) 10 MG tablet Take 10 mg by mouth daily.      FARXIGA 10 MG TABS tablet Take 10 mg by mouth daily.   1   furosemide (LASIX) 40 MG tablet Take 1 tablet (40 mg total) by mouth as needed. For weight gain of 3lb in 24 hours or 5 lb in a week 45 tablet 3   glipiZIDE (GLUCOTROL) 5 MG tablet Take 5 mg by mouth 2 (two) times daily before a meal.      meloxicam (MOBIC) 15 MG tablet Take 15 mg by mouth daily.     metFORMIN (GLUCOPHAGE-XR) 500 MG 24 hr tablet Take 1,000 mg by mouth  2 (two) times a day.      metoprolol succinate (TOPROL-XL) 25 MG 24 hr tablet TAKE 1 TABLET (25 MG TOTAL) BY MOUTH DAILY. 90 tablet 3   Potassium Chloride ER 20 MEQ TBCR Take 1 tablet by mouth daily.     rosuvastatin (CRESTOR) 20 MG tablet Take 1 tablet (20 mg total) by mouth daily. 30 tablet 11   sacubitril-valsartan (ENTRESTO) 24-26 MG Take 1 tablet by mouth 2 (two) times daily. 60 tablet 11   spironolactone (ALDACTONE) 25 MG tablet Take 1 tablet (25 mg total) by mouth daily. 90 tablet 3   TRULICITY 4.5 MG/0.5ML SOPN INJECT 1/2 MILLILITER(S) SUBCUTANEOUS ONCE A WEEK     XARELTO 20 MG TABS tablet Take 20 mg by mouth daily.   6   No current facility-administered medications for this encounter.   Allergies  Allergen Reactions   Procaine Hcl Nausea And Vomiting    Per Pharmacy 09/09/2019,  Okay for Lidocaine and Bupivicaine.   Social History   Socioeconomic History   Marital status: Divorced    Spouse name: Not on file   Number of children: Not on file   Years of education: Not on file   Highest education level: Not on file  Occupational History   Not on file  Tobacco Use   Smoking status: Never   Smokeless tobacco: Never  Vaping Use   Vaping Use: Never used  Substance and Sexual Activity   Alcohol use: Yes    Alcohol/week: 1.0 standard drink of alcohol    Types: 1 Standard drinks or equivalent per week    Comment: seldom   Drug use: No   Sexual activity: Not on file  Other Topics Concern   Not on file  Social History Narrative   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,  Scientist, product/process development         Social Determinants of Corporate investment banker Strain: Not on file  Food Insecurity: Not on file  Transportation Needs: Not on file  Physical Activity: Not on file  Stress: Not on file  Social Connections: Not on file  Intimate Partner Violence: Not on file   Family History  Problem Relation Age of Onset   Hypertension Mother    Diabetes Mother    Alcohol abuse Father    CAD Father    Coronary artery disease Sister        with multiple stents   Coronary artery disease Other        in multiple family members   Hypertension Brother    Diabetes Sister    Diabetes Brother    BP 112/78   Pulse 90   Wt 109.8 kg (242 lb)   SpO2 94%   BMI 30.25 kg/m   Wt Readings from Last 3 Encounters:  12/13/22 109.8 kg (242 lb)  09/12/22 110.5 kg (243 lb 9.6 oz)  06/27/22 112.8 kg (248 lb 9.6 oz)   PHYSICAL EXAM: General:  NAD. No resp difficulty HEENT: Normal Neck: Supple. No JVD. Carotids 2+ bilat; no bruits. No lymphadenopathy or thryomegaly appreciated. Cor: PMI nondisplaced. Regular rate & rhythm. No rubs, gallops or murmurs. Lungs: Clear Abdomen: Soft, nontender, nondistended. No hepatosplenomegaly. No bruits or masses. Good bowel sounds. Extremities:  No cyanosis, clubbing, rash, edema Neuro: Alert & oriented x 3, cranial nerves grossly intact. Moves all 4 extremities w/o difficulty. Affect pleasant.  ECG (personally reviewed): NSR no PVCs, 87 bpm; 1 AVB  ASSESSMENT & PLAN: 1. Chronic systolic HF due  to NICM  - Echo 2023 showed severe LV dysfunction with EF 20-25% with moderate RVE and moderate RV dysfunction, G3DD mild AR.   - R/L Cath  02/07/22 Minimal CAD LAD 20% RCA 20% with apparent akinesis of the anterior wall. EF 25-30%. Well compensated hemodynamics.  - cMRI 05/30/22: LVEF = 37%. No LGE. RV normal Mild AI. No evidence of infiltrative disease.  - Zio 5/23 -67 runs of NSVTl 141 runs of SVT,  Frequent PACs 6.1%, Frequent PVCs 9.8%. - Suspect PVC CM. Amio started 6/23 - Echo 10/23 EF improved 60-65%  moderate LVH. Discussed and reviewed by Dr Haroldine Laws  - GDMT titration limited by orthostasis.  - NYHA I.  Volume status stable, on Lasix 10 mg daily. - Continue Entresto 24/26 mg bid - Continue Farxiga 10 mg daily. - Continue spiro 25 mg daily.  - Continue Toprol XL 25 mg daily.  - Labs today.  2. PAF - s/p previous ablation.  - Maintaining NSR.  - On Xarelto. No bleeding.  3. PVCs - frequent PVCs noted on prior EKG 2/23 - Zio 5/23 9.8% PVCs - suspect PVC associated CM  - PVC appeared to be suppressed on ECG today. - Stop amiodarone. Repeat Zio 2 week in 3 months, if PVCs reoccurs will send to EP for AA for possible ablation.   - Check amio labs today.  4. OSA, moderate  - Moderate, with AHI 16.4/hour.  - He has a CPAP and has been using for about 4 hours at a time.   5. Obesity  - Body mass index is 08.65 kg/m. - On Trulicity.   Follow up in 3 months. Will place Zio at that time. If PVCs gone, will monitor. If they return, will send to EP for ablation vs mexiletine or flecainide.  Allena Katz, FNP-BC 12/13/22

## 2022-12-13 NOTE — Patient Instructions (Addendum)
Thank you for coming in today  Labs were done today, if any labs are abnormal the clinic will call you No news is good news   STOP Amiodarone  Your physician recommends that you schedule a follow-up appointment in:  3 months with Dr. Haroldine Laws You will receive a reminder letter in the mail a few months in advance. If you don't receive a letter, please call our office to schedule the follow-up appointment.    Do the following things EVERYDAY: Weigh yourself in the morning before breakfast. Write it down and keep it in a log. Take your medicines as prescribed Eat low salt foods--Limit salt (sodium) to 2000 mg per day.  Stay as active as you can everyday Limit all fluids for the day to less than 2 liters  At the [facility], you and your health needs are our priority. As part of our continuing mission to provide you with exceptional heart care, we have created designated Provider Care Teams. These Care Teams include your primary Cardiologist (physician) and Advanced Practice Providers (APPs- Physician Assistants and Nurse Practitioners) who all work together to provide you with the care you need, when you need it.   You may see any of the following providers on your designated Care Team at your next follow up: Dr Glori Bickers Dr Loralie Champagne Dr. Roxana Hires, NP Lyda Jester, [place] [facility] [place], [place] Forestine Na, NP Audry Riles, PharmD   Please be sure to bring in all your medications bottles to every appointment.   If you have any questions or concerns before your next appointment please send [place] a message through [place] or call our office at [phone].    TO LEAVE A MESSAGE FOR THE NURSE SELECT OPTION 2, PLEASE LEAVE A MESSAGE INCLUDING: YOUR NAME DATE OF BIRTH CALL BACK NUMBER REASON FOR CALL**this is important as we prioritize the call backs  YOU WILL RECEIVE A CALL BACK THE SAME DAY AS LONG AS YOU CALL BEFORE 4:00  PM

## 2022-12-26 ENCOUNTER — Other Ambulatory Visit (HOSPITAL_COMMUNITY): Payer: Self-pay | Admitting: Cardiovascular Disease

## 2022-12-26 ENCOUNTER — Other Ambulatory Visit (HOSPITAL_COMMUNITY): Payer: Self-pay | Admitting: Nurse Practitioner

## 2023-01-16 ENCOUNTER — Encounter (HOSPITAL_COMMUNITY): Payer: Self-pay | Admitting: *Deleted

## 2023-01-21 ENCOUNTER — Other Ambulatory Visit: Payer: Self-pay | Admitting: Cardiology

## 2023-01-21 DIAGNOSIS — G4733 Obstructive sleep apnea (adult) (pediatric): Secondary | ICD-10-CM

## 2023-01-21 DIAGNOSIS — I10 Essential (primary) hypertension: Secondary | ICD-10-CM

## 2023-01-21 DIAGNOSIS — E7800 Pure hypercholesterolemia, unspecified: Secondary | ICD-10-CM

## 2023-01-21 DIAGNOSIS — I480 Paroxysmal atrial fibrillation: Secondary | ICD-10-CM

## 2023-01-21 DIAGNOSIS — I428 Other cardiomyopathies: Secondary | ICD-10-CM

## 2023-01-21 DIAGNOSIS — I2510 Atherosclerotic heart disease of native coronary artery without angina pectoris: Secondary | ICD-10-CM

## 2023-01-21 DIAGNOSIS — R9439 Abnormal result of other cardiovascular function study: Secondary | ICD-10-CM

## 2023-01-22 MED ORDER — ENTRESTO 24-26 MG PO TABS: 1.0000 | ORAL_TABLET | Freq: Two times a day (BID) | ORAL | 0 refills | Status: DC

## 2023-02-27 ENCOUNTER — Other Ambulatory Visit: Payer: Self-pay | Admitting: Cardiology

## 2023-02-27 ENCOUNTER — Other Ambulatory Visit (HOSPITAL_COMMUNITY): Payer: Self-pay | Admitting: Internal Medicine

## 2023-02-27 DIAGNOSIS — G4733 Obstructive sleep apnea (adult) (pediatric): Secondary | ICD-10-CM

## 2023-02-27 DIAGNOSIS — R9439 Abnormal result of other cardiovascular function study: Secondary | ICD-10-CM

## 2023-02-27 DIAGNOSIS — I2510 Atherosclerotic heart disease of native coronary artery without angina pectoris: Secondary | ICD-10-CM

## 2023-02-27 DIAGNOSIS — I428 Other cardiomyopathies: Secondary | ICD-10-CM

## 2023-02-27 DIAGNOSIS — I480 Paroxysmal atrial fibrillation: Secondary | ICD-10-CM

## 2023-02-27 DIAGNOSIS — E7800 Pure hypercholesterolemia, unspecified: Secondary | ICD-10-CM

## 2023-02-27 DIAGNOSIS — I10 Essential (primary) hypertension: Secondary | ICD-10-CM

## 2023-02-28 NOTE — Telephone Encounter (Signed)
This is a CHF pt 

## 2023-03-16 ENCOUNTER — Other Ambulatory Visit: Payer: Self-pay | Admitting: Cardiology

## 2023-03-16 DIAGNOSIS — I519 Heart disease, unspecified: Secondary | ICD-10-CM

## 2023-03-16 DIAGNOSIS — I428 Other cardiomyopathies: Secondary | ICD-10-CM

## 2023-03-24 ENCOUNTER — Other Ambulatory Visit: Payer: Self-pay | Admitting: Cardiology

## 2023-03-31 ENCOUNTER — Other Ambulatory Visit: Payer: Self-pay | Admitting: Internal Medicine

## 2023-03-31 DIAGNOSIS — I480 Paroxysmal atrial fibrillation: Secondary | ICD-10-CM

## 2023-03-31 DIAGNOSIS — E7800 Pure hypercholesterolemia, unspecified: Secondary | ICD-10-CM

## 2023-03-31 DIAGNOSIS — I2510 Atherosclerotic heart disease of native coronary artery without angina pectoris: Secondary | ICD-10-CM

## 2023-03-31 DIAGNOSIS — G4733 Obstructive sleep apnea (adult) (pediatric): Secondary | ICD-10-CM

## 2023-03-31 DIAGNOSIS — I428 Other cardiomyopathies: Secondary | ICD-10-CM

## 2023-03-31 DIAGNOSIS — R9439 Abnormal result of other cardiovascular function study: Secondary | ICD-10-CM

## 2023-03-31 DIAGNOSIS — I10 Essential (primary) hypertension: Secondary | ICD-10-CM

## 2023-04-24 ENCOUNTER — Other Ambulatory Visit: Payer: Self-pay | Admitting: Cardiology

## 2023-04-29 ENCOUNTER — Other Ambulatory Visit: Payer: Self-pay | Admitting: Cardiology

## 2023-05-19 ENCOUNTER — Other Ambulatory Visit (HOSPITAL_COMMUNITY): Payer: Self-pay | Admitting: Internal Medicine

## 2023-05-19 ENCOUNTER — Other Ambulatory Visit (HOSPITAL_COMMUNITY): Payer: Self-pay | Admitting: Cardiovascular Disease

## 2023-05-19 DIAGNOSIS — I428 Other cardiomyopathies: Secondary | ICD-10-CM

## 2023-05-19 DIAGNOSIS — I519 Heart disease, unspecified: Secondary | ICD-10-CM

## 2023-06-06 ENCOUNTER — Other Ambulatory Visit (HOSPITAL_COMMUNITY): Payer: Self-pay | Admitting: Cardiovascular Disease

## 2023-06-06 ENCOUNTER — Other Ambulatory Visit (HOSPITAL_COMMUNITY): Payer: Self-pay | Admitting: Internal Medicine

## 2023-08-14 ENCOUNTER — Other Ambulatory Visit (HOSPITAL_COMMUNITY): Payer: Self-pay | Admitting: Internal Medicine

## 2023-08-29 IMAGING — DX DG CHEST 2V
2 series · 2 of 2 positions shown · non-contrast
Comparison: Chest radiograph dated July 11, 2019

CLINICAL DATA: Shortness of breath and chest tightness for 1 week

EXAM:
CHEST - 2 VIEW

[chest pa]
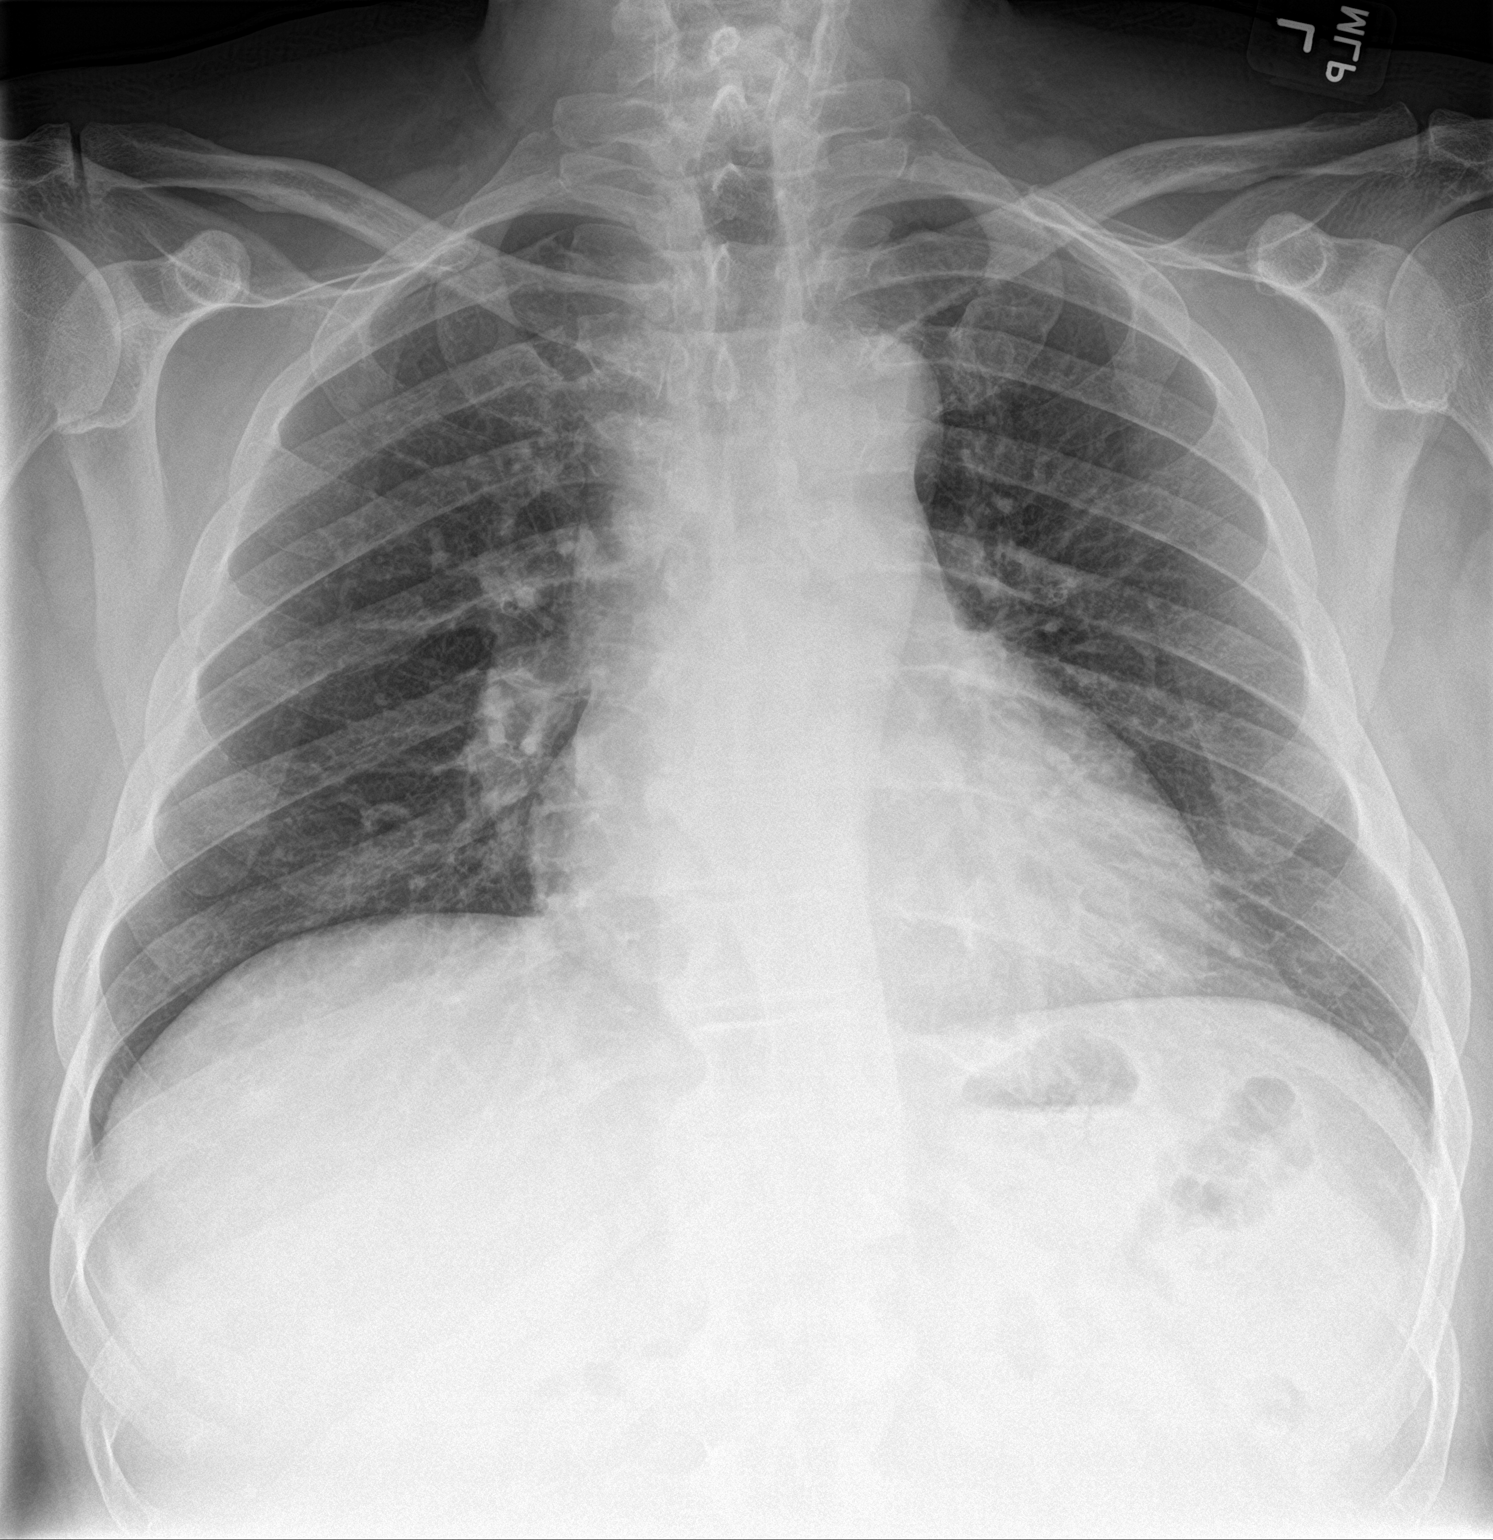

[chest lat]
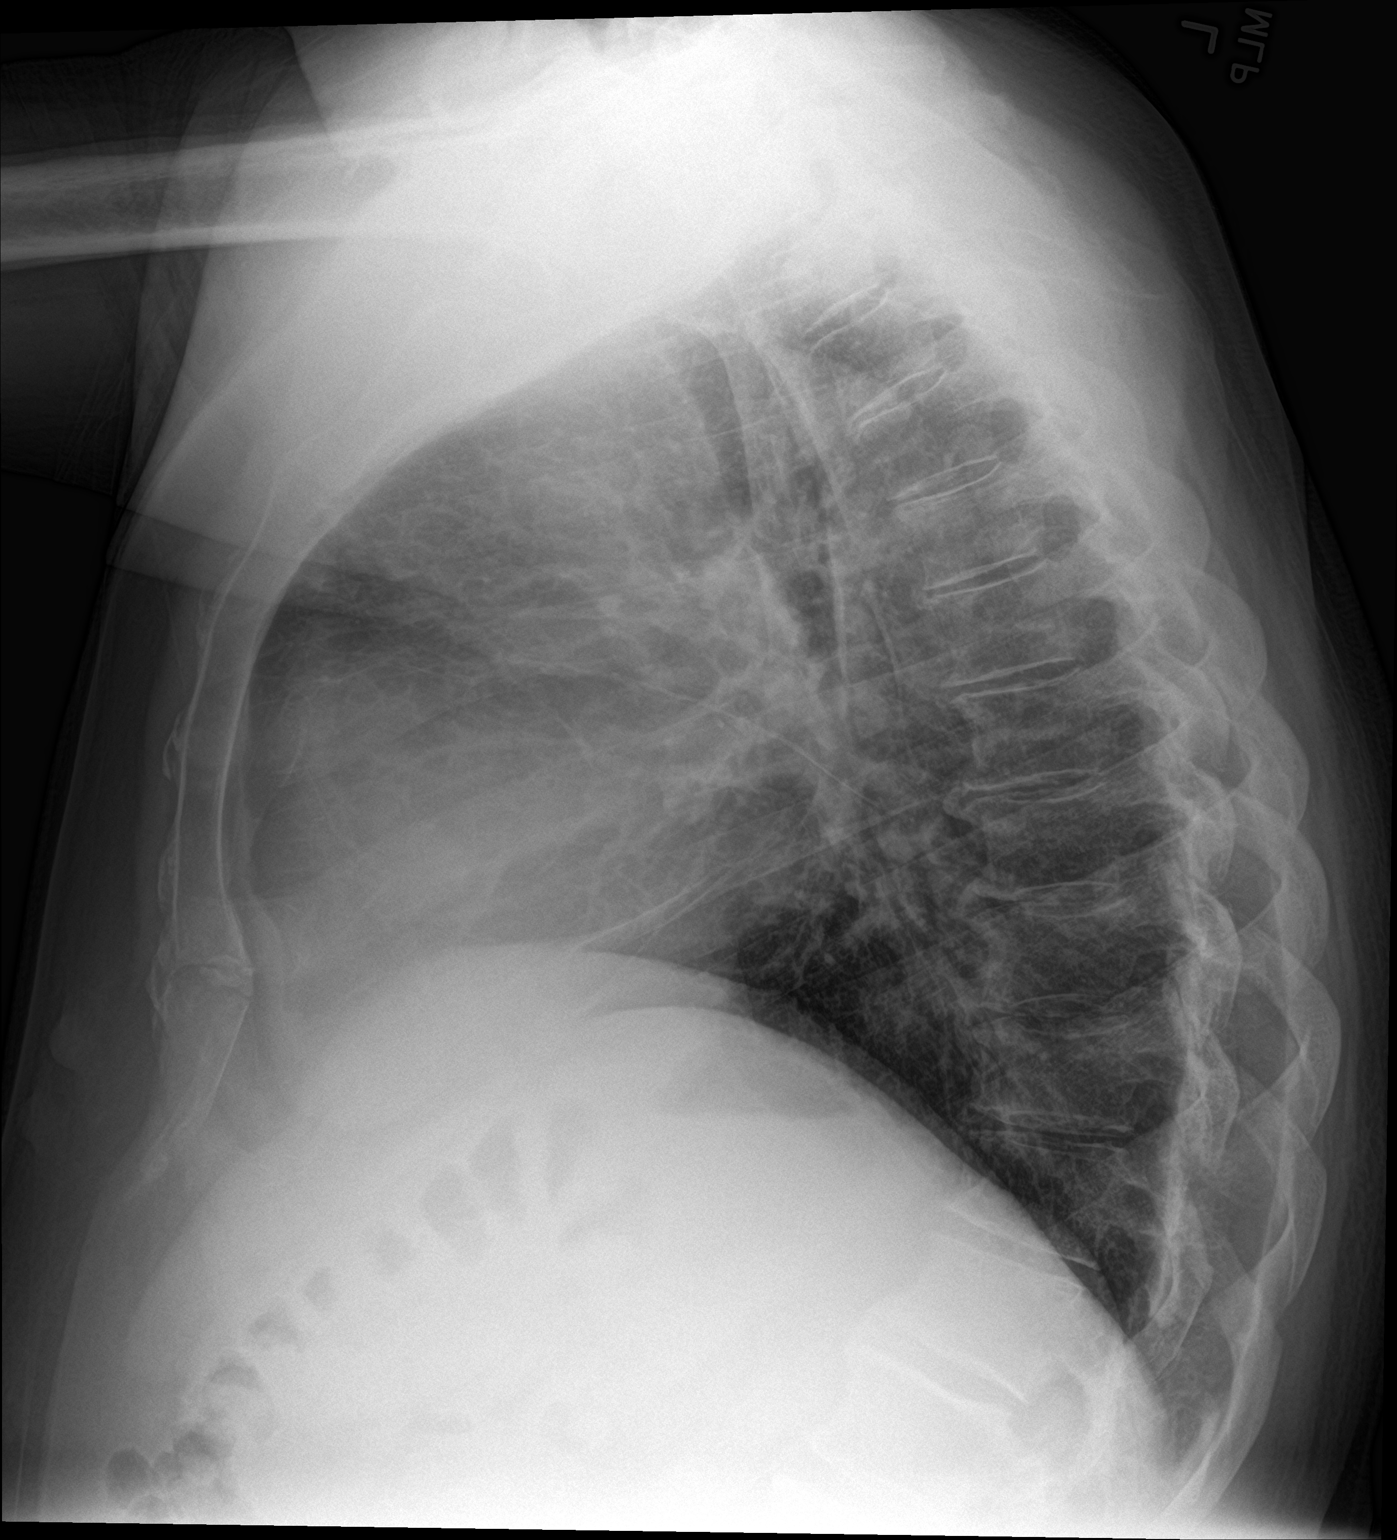

[2 of 2 positions shown; findings below may reference images not displayed]

FINDINGS: The heart size and mediastinal contours are within normal limits.
Both lungs are clear. The visualized skeletal structures are
unremarkable.
IMPRESSION: No active cardiopulmonary disease.

## 2023-10-03 ENCOUNTER — Other Ambulatory Visit: Payer: Self-pay | Admitting: Internal Medicine

## 2023-10-03 DIAGNOSIS — I480 Paroxysmal atrial fibrillation: Secondary | ICD-10-CM

## 2023-10-03 DIAGNOSIS — I2510 Atherosclerotic heart disease of native coronary artery without angina pectoris: Secondary | ICD-10-CM

## 2023-10-03 DIAGNOSIS — I428 Other cardiomyopathies: Secondary | ICD-10-CM

## 2023-10-03 DIAGNOSIS — E7800 Pure hypercholesterolemia, unspecified: Secondary | ICD-10-CM

## 2023-10-03 DIAGNOSIS — I10 Essential (primary) hypertension: Secondary | ICD-10-CM

## 2023-10-03 DIAGNOSIS — R9439 Abnormal result of other cardiovascular function study: Secondary | ICD-10-CM

## 2023-10-03 DIAGNOSIS — G4733 Obstructive sleep apnea (adult) (pediatric): Secondary | ICD-10-CM

## 2023-12-15 ENCOUNTER — Ambulatory Visit: Payer: BC Managed Care – PPO | Admitting: Dermatology

## 2023-12-15 ENCOUNTER — Encounter: Payer: Self-pay | Admitting: Dermatology

## 2023-12-15 DIAGNOSIS — Z5111 Encounter for antineoplastic chemotherapy: Secondary | ICD-10-CM

## 2023-12-15 DIAGNOSIS — L570 Actinic keratosis: Secondary | ICD-10-CM | POA: Diagnosis not present

## 2023-12-15 DIAGNOSIS — D0339 Melanoma in situ of other parts of face: Secondary | ICD-10-CM

## 2023-12-15 DIAGNOSIS — W908XXA Exposure to other nonionizing radiation, initial encounter: Secondary | ICD-10-CM

## 2023-12-15 DIAGNOSIS — L578 Other skin changes due to chronic exposure to nonionizing radiation: Secondary | ICD-10-CM

## 2023-12-15 DIAGNOSIS — D039 Melanoma in situ, unspecified: Secondary | ICD-10-CM

## 2023-12-15 DIAGNOSIS — D492 Neoplasm of unspecified behavior of bone, soft tissue, and skin: Secondary | ICD-10-CM

## 2023-12-15 HISTORY — DX: Melanoma in situ, unspecified: D03.9

## 2023-12-15 MED ORDER — FLUOROURACIL 5 % EX CREA: TOPICAL_CREAM | CUTANEOUS | 1 refills | Status: DC

## 2023-12-15 NOTE — Patient Instructions (Addendum)
 - Start 5-fluorouracil /calcipotriene cream twice a day up to 5 days to affected areas on face. Treat sections at a time   Prescription sent to   [facility]  [address] [place], [place] [address]  Phone: [phone]   Reviewed course of treatment and expected reaction.  Patient advised to expect inflammation and crusting and advised that erosions are possible.  Patient advised to be diligent with sun protection during and after treatment. Counseled to keep medication out of reach of children and pets.      Recommend daily broad spectrum sunscreen SPF 30+ to sun-exposed areas, reapply every 2 hours as needed. Call for new or changing lesions.  Staying in the shade or wearing long sleeves, sun glasses (UVA+UVB protection) and wide brim hats (4-inch brim around the entire circumference of the hat) are also recommended for sun protection.      Wound Care Instructions  Cleanse wound gently with soap and water once a day then pat dry with clean gauze. Apply a thin coat of Petrolatum (petroleum jelly, Vaseline) over the wound (unless you have an allergy to this). We recommend that you use a new, sterile tube of Vaseline. Do not pick or remove scabs. Do not remove the yellow or white healing tissue from the base of the wound.  Cover the wound with fresh, clean, nonstick gauze and secure with paper tape. You may use Band-Aids in place of gauze and tape if the wound is small enough, but would recommend trimming much of the tape off as there is often too much. Sometimes Band-Aids can irritate the skin.  You should call the office for your biopsy report after 1 week if you have not already been contacted.  If you experience any problems, such as abnormal amounts of bleeding, swelling, significant bruising, significant pain, or evidence of infection, please call the office immediately.  FOR ADULT SURGERY PATIENTS: If you need something for pain relief you may take 1 extra strength  Tylenol  (acetaminophen ) AND 2 Ibuprofen (200mg  each) together every 4 hours as needed for pain. (do not take these if you are allergic to them or if you have a reason you should not take them.) Typically, you may only need pain medication for 1 to 3 days.      Due to recent changes in healthcare laws, you may see results of your pathology and/or laboratory studies on MyChart before the doctors have had a chance to review them. We understand that in some cases there may be results that are confusing or concerning to you. Please understand that not all results are received at the same time and often the doctors may need to interpret multiple results in order to provide you with the best plan of care or course of treatment. Therefore, we ask that you please give us  2 business days to thoroughly review all your results before contacting the office for clarification. Should we see a critical lab result, you will be contacted sooner.   If You Need Anything After Your Visit  If you have any questions or concerns for your doctor, please call our main line at [phone] and press option 4 to reach your doctor's medical assistant. If no one answers, please leave a voicemail as directed and we will return your call as soon as possible. Messages left after 4 pm will be answered the following business day.   You may also send us  a message via MyChart. We typically respond to MyChart messages within 1-2 business days.  For prescription refills, please ask your pharmacy to contact our office. Our fax number is [phone].  If you have an urgent issue when the clinic is closed that cannot wait until the next business day, you can page your doctor at the number below.    Please note that while we do our best to be available for urgent issues outside of office hours, we are not available 24/7.   If you have an urgent issue and are unable to reach us , you may choose to seek medical care at your doctor's office,  retail clinic, urgent care center, or emergency room.  If you have a medical emergency, please immediately call 911 or go to the emergency department.  Pager Numbers  - Dr. Hester: [phone]  - Dr. Jackquline: [phone]  - Dr. Claudene: [phone]   In the event of inclement weather, please call our main line at [phone] for an update on the status of any delays or closures.  Dermatology Medication Tips: Please keep the boxes that topical medications come in in order to help keep track of the instructions about where and how to use these. Pharmacies typically print the medication instructions only on the boxes and not directly on the medication tubes.   If your medication is too expensive, please contact our office at [phone] option 4 or send us  a message through MyChart.   We are unable to tell what your co-pay for medications will be in advance as this is different depending on your insurance coverage. However, we may be able to find a substitute medication at lower cost or fill out paperwork to get insurance to cover a needed medication.   If a prior authorization is required to get your medication covered by your insurance company, please allow us  1-2 business days to complete this process.  Drug prices often vary depending on where the prescription is filled and some pharmacies may offer cheaper prices.  The website www.goodrx.com contains coupons for medications through different pharmacies. The prices here do not account for what the cost may be with help from insurance (it may be cheaper with your insurance), but the website can give you the price if you did not use any insurance.  - You can print the associated coupon and take it with your prescription to the pharmacy.  - You may also stop by our office during regular business hours and pick up a GoodRx coupon card.  - If you need your prescription sent electronically to a different pharmacy, notify our office  through [facility] or by phone at [phone] option 4.     Si Usted Necesita Algo Despus de Su Visita  Tambin puede enviarnos un mensaje a travs de Clinical Cytogeneticist. Por lo general respondemos a los mensajes de MyChart en el transcurso de 1 a 2 das hbiles.  Para renovar recetas, por favor pida a su farmacia que se ponga en contacto con nuestra oficina. Randi lakes de fax es [place] [phone].  Si tiene un asunto urgente cuando la clnica est cerrada y que no puede esperar hasta el siguiente da hbil, puede llamar/localizar a su doctor(a) al nmero que aparece a continuacin.   Por favor, tenga en cuenta que aunque hacemos todo lo posible para estar disponibles para asuntos urgentes fuera del horario de [place], no estamos disponibles las 24 horas del da, los 7 [address]  [phone] de la [place].   Si tiene un problema urgente y no puede comunicarse con nosotros, puede optar por buscar atencin mdica  en el consultorio de su doctor(a), en una clnica privada, en un centro de atencin urgente o en una sala de emergencias.  Si tiene engineer, drilling, por favor llame inmediatamente al 911 o vaya a la sala de emergencias.  Nmeros de bper  - Dr. Hester: [phone]  - Dra. Jackquline: [phone]  - Dr. Claudene: [phone]   En caso de inclemencias del tiempo, por favor llame a landry capes principal al [phone] para una actualizacin sobre el [place] de cualquier retraso o cierre.  Consejos para la medicacin en dermatologa: Por favor, guarde las cajas en las que vienen los medicamentos de uso tpico para ayudarle a seguir las instrucciones sobre dnde y cmo usarlos. Las farmacias generalmente imprimen las instrucciones del medicamento slo en las cajas y no directamente en los tubos del [place].   Si su medicamento es muy caro, por favor, pngase en contacto con landry rieger llamando al [phone] y presione la opcin 4 o envenos un mensaje a travs de Clinical Cytogeneticist.   No  podemos decirle cul ser su copago por los medicamentos por adelantado ya que esto es diferente dependiendo de la cobertura de su seguro. Sin embargo, es posible que podamos encontrar un medicamento sustituto a audiological scientist un formulario para que el seguro cubra el medicamento que se considera necesario.   Si se requiere una autorizacin previa para que su compaa de seguros cubra su medicamento, por favor permtanos de 1 a 2 das hbiles para completar este proceso.  Los precios de los medicamentos varan con frecuencia dependiendo del environmental consultant de dnde se surte la receta y alguna farmacias pueden ofrecer precios ms baratos.  El sitio web www.goodrx.com tiene cupones para medicamentos de health and safety inspector. Los precios aqu no tienen en cuenta lo que podra costar con la ayuda del seguro (puede ser ms barato con su seguro), pero el sitio web puede darle el precio si no utiliz tourist information centre manager.  - Puede imprimir el cupn correspondiente y llevarlo con su receta a la farmacia.  - Tambin puede pasar por nuestra oficina durante el horario de atencin regular y education officer, museum una tarjeta de cupones de GoodRx.  - Si necesita que su receta se enve electrnicamente a una farmacia diferente, informe a nuestra oficina a travs de MyChart de [place] o por telfono llamando al [phone] y presione la opcin 4.

## 2023-12-15 NOTE — Progress Notes (Signed)
 New Patient Visit   Subjective  Charles [name] is a 58 y.o. male who presents for the following: Spot on right cheek. Non tender. Has not bled.   The patient has spots, moles and lesions to be evaluated, some may be new or changing and the patient may have concern these could be cancer.    The following portions of the chart were reviewed this encounter and updated as appropriate: medications, allergies, medical history  Review of Systems:  No other skin or systemic complaints except as noted in HPI or Assessment and Plan.  Objective  Well appearing patient in no apparent distress; mood and affect are within normal limits.  A focused examination was performed of the following areas: Scalp, face, neck, ears, hands Relevant physical exam findings are noted in the Assessment and Plan.  Right Cheek 6 mm Irregular pigmented macule        Assessment & Plan   NEOPLASM OF SKIN Right Cheek Skin / nail biopsy Type of biopsy: tangential   Informed consent: discussed and consent obtained   Timeout: patient name, date of birth, surgical site, and procedure verified   Procedure prep:  Patient was prepped and draped in usual sterile fashion Prep type:  Isopropyl alcohol Anesthesia: the lesion was anesthetized in a standard fashion   Anesthetic:  1% lidocaine  w/ epinephrine 1-100,000 buffered w/ 8.4% NaHCO3 Instrument used: DermaBlade   Hemostasis achieved with: pressure and aluminum chloride   Outcome: patient tolerated procedure well   Post-procedure details: sterile dressing applied and wound care instructions given   Dressing type: bandage and petrolatum   Specimen 1 - Surgical pathology Differential Diagnosis: SK vs dysplastic nevus vs melanoma  Check Margins: No  ACTINIC DAMAGE WITH PRECANCEROUS ACTINIC KERATOSES Counseling for Topical Chemotherapy Management: Patient exhibits: - Severe, confluent actinic changes with pre-cancerous actinic keratoses that is secondary  to cumulative UV radiation exposure over time on lateral face and neck L > R - Condition that is severe; chronic, not at goal. - diffuse scaly erythematous macules and papules with underlying dyspigmentation - Discussed Prescription Field Treatment topical Chemotherapy for Severe, Chronic Confluent Actinic Changes with Pre-Cancerous Actinic Keratoses Field treatment involves treatment of an entire area of skin that has confluent Actinic Changes (Sun/ Ultraviolet light damage) and PreCancerous Actinic Keratoses by method of PhotoDynamic Therapy (PDT) and/or prescription Topical Chemotherapy agents such as 5-fluorouracil , 5-fluorouracil /calcipotriene, and/or imiquimod .  The purpose is to decrease the number of clinically evident and subclinical PreCancerous lesions to prevent progression to development of skin cancer by chemically destroying early precancer changes that may or may not be visible.  It has been shown to reduce the risk of developing skin cancer in the treated area. As a result of treatment, redness, scaling, crusting, and open sores may occur during treatment course. One or more than one of these methods may be used and may have to be used several times to control, suppress and eliminate the PreCancerous changes. Discussed treatment course, expected reaction, and possible side effects. - Recommend daily broad spectrum sunscreen SPF 30+ to sun-exposed areas, reapply every 2 hours as needed.  - Staying in the shade or wearing long sleeves, sun glasses (UVA+UVB protection) and wide brim hats (4-inch brim around the entire circumference of the hat) are also recommended. - Call for new or changing lesions.  - Start 5-fluorouracil /calcipotriene cream twice a day up to 5 days to affected areas on face. Treat sections at a time   Prescription sent to  [facility]  [address] [place], [place] [address]  Phone: [phone]   Reviewed course of treatment and expected reaction.   Patient advised to expect inflammation and crusting and advised that erosions are possible.  Patient advised to be diligent with sun protection during and after treatment. Counseled to keep medication out of reach of children and pets.    Return in about 2 months (around 02/12/2024) for TBSE, [place] Follow Up.  I, Kate Fought, CMA, am acting as scribe for Boneta Sharps, MD.   Documentation: I have reviewed the above documentation for accuracy and completeness, and I agree with the above.  Boneta Sharps, MD

## 2023-12-22 ENCOUNTER — Telehealth: Payer: Self-pay | Admitting: Dermatology

## 2023-12-22 ENCOUNTER — Telehealth: Payer: Self-pay

## 2023-12-22 DIAGNOSIS — D0330 Melanoma in situ of unspecified part of face: Secondary | ICD-10-CM

## 2023-12-22 LAB — SURGICAL PATHOLOGY

## 2023-12-22 NOTE — Telephone Encounter (Signed)
 Dr. Katrinka Blazing discussed pathology results and Mohs surgery with patient.  Advised patient of Mohs surgery locations. Patient prefers Dr. Caralyn Guile at [facility]. Referral sent.

## 2023-12-22 NOTE — Telephone Encounter (Signed)
 Discussed biopsy result with patient by phone. Biopsy from R cheek shows melanoma in situ. Early skin cancer, risk of spread if not removed, lethal in the worst cases, recommend Mohs surgery. Patient agrees. All questions answered

## 2023-12-22 NOTE — Telephone Encounter (Signed)
-----   Message from [place] sent at 12/22/2023  5:55 PM EST ----- Diagnosis: right cheek :       MELANOMA IN SITU, PERIPHERAL MARGIN INVOLVED    Plan: I called patient to explain. Please send referral to Mohs surgery. Please schedule skin check in 3 months

## 2023-12-23 ENCOUNTER — Encounter: Payer: Self-pay | Admitting: Internal Medicine

## 2023-12-23 ENCOUNTER — Ambulatory Visit: Payer: BC Managed Care – PPO | Admitting: Internal Medicine

## 2023-12-23 VITALS — BP 120/60 | HR 69 | Temp 97.6°F | Ht 75.0 in | Wt 237.8 lb

## 2023-12-23 DIAGNOSIS — E119 Type 2 diabetes mellitus without complications: Secondary | ICD-10-CM | POA: Diagnosis not present

## 2023-12-23 DIAGNOSIS — I10 Essential (primary) hypertension: Secondary | ICD-10-CM | POA: Diagnosis not present

## 2023-12-23 DIAGNOSIS — G4719 Other hypersomnia: Secondary | ICD-10-CM | POA: Diagnosis not present

## 2023-12-23 DIAGNOSIS — I4891 Unspecified atrial fibrillation: Secondary | ICD-10-CM

## 2023-12-23 DIAGNOSIS — R0683 Snoring: Secondary | ICD-10-CM | POA: Diagnosis not present

## 2023-12-23 NOTE — Progress Notes (Signed)
 Name: Charles [name] MRN: [id number] DOB: 1966-08-28    CHIEF COMPLAINT:  EXCESSIVE DAYTIME SLEEPINESS ASSESSMENT OF SLEEP APNEA   HISTORY OF PRESENT ILLNESS: Patient is seen today for problems and issues with sleep related to excessive daytime sleepiness Patient  has been having sleep problems for many years Patient has been having excessive daytime sleepiness for a long time Patient has been having extreme fatigue and tiredness, lack of energy +  very Loud snoring every night + struggling breathe at night and gasps for air + morning headaches + Nonrefreshing sleep  Home sleep test reviewed in detail with patient Patient diagnosed with AHI of 16 moderate sleep apnea in 2023 Patient has tried CPAP with mask is here to assess for other options and therapy Patient did not tolerate any type of mask  Discussed sleep data and reviewed with patient.  Encouraged proper weight management.  Discussed driving precautions and its relationship with hypersomnolence.  Discussed operating dangerous equipment and its relationship with hypersomnolence.  Discussed sleep hygiene, and benefits of a fixed sleep waked time.  The importance of getting eight or more hours of sleep discussed with patient.  Discussed limiting the use of the computer and television before bedtime.  Decrease naps during the day, so night time sleep will become enhanced.  Limit caffeine, and sleep deprivation.  HTN, stroke, and heart failure are potential risk factors.    EPWORTH SLEEP SCORE 3  Patient weighed 280 pounds at the time of home sleep test in 2023 Currently patient has lost 50 pounds and therefore I would recommend repeating home sleep study to assess severity of sleep apnea  All the different options were discussed with patient today CPAP therapy Weight loss and repeat study Oral device and repeat study Inspire device  PAST MEDICAL HISTORY :   has a past medical history of Atrial fibrillation (HCC),  Basal cell carcinoma, Chronic systolic dysfunction of left ventricle, Diabetes mellitus, Hyperlipidemia, Hypertension, LVH (left ventricular hypertrophy), Melanoma in situ (HCC) (12/15/2023), Nonischemic cardiomyopathy (HCC), Obesity, Obstructive sleep apnea, and Peripheral vascular disease (HCC).  has a past surgical history that includes Radiofrequency ablation (10/01/2011); Cardioversion (08/09/11); Cardiac catheterization (10/15/04); Cardioversion (N/A, 07/09/2019); ATRIAL FIBRILLATION ABLATION (N/A, 09/09/2019); and RIGHT/LEFT HEART CATH AND CORONARY ANGIOGRAPHY (N/A, 02/07/2022). Prior to Admission medications   Medication Sig Start Date End Date Taking? Authorizing Provider  APPLE CIDER VINEGAR PO Take 450-900 mg by mouth See admin instructions. Take 2 tablets (900 mg) by mouth in the morning & 1 tablet (450 mg) by mouth in the evening.    [provider]  cetirizine (ZYRTEC) 10 MG tablet Take 10 mg by mouth daily.     [provider]  ENTRESTO  24-26 MG TAKE 1 TABLET BY MOUTH TWICE A DAY 10/06/23   Bensimhon, Toribio SAUNDERS, MD  FARXIGA 10 MG TABS tablet Take 10 mg by mouth daily.  02/17/17   [provider]  fluorouracil  (EFUDEX ) 5 % cream Apply twice daily to affected areas on face up to 5 days. Treat sections at a time. 12/15/23   Claudene Lehmann, MD  furosemide  (LASIX ) 40 MG tablet TAKE 1 TABLET (40 MG TOTAL) BY MOUTH AS NEEDED. FOR WEIGHT GAIN OF 3LB IN 24 HOURS OR 5 LB IN A WEEK 02/28/23   Bensimhon, Daniel R, MD  glipiZIDE (GLUCOTROL) 5 MG tablet Take 5 mg by mouth 2 (two) times daily before a meal.  09/04/11   [provider]  meloxicam (MOBIC) 15 MG tablet Take 15 mg  by mouth daily.    [provider]  metFORMIN (GLUCOPHAGE-XR) 500 MG 24 hr tablet Take 1,000 mg by mouth 2 (two) times a day.     [provider]  metoprolol  succinate (TOPROL -XL) 25 MG 24 hr tablet TAKE 1 TABLET (25 MG TOTAL) BY MOUTH DAILY. 08/26/22   Allred, Lynwood, MD  Potassium  Chloride ER 20 MEQ TBCR TAKE 1 TABLET BY MOUTH EVERY DAY 05/20/23   Bensimhon, Toribio SAUNDERS, MD  rosuvastatin  (CRESTOR ) 20 MG tablet TAKE 1 TABLET BY MOUTH EVERY DAY 04/24/23   Shlomo Wilbert SAUNDERS, MD  spironolactone  (ALDACTONE ) 25 MG tablet TAKE 1 TABLET (25 MG TOTAL) BY MOUTH DAILY. 05/20/23   Bensimhon, Toribio SAUNDERS, MD  TRULICITY 4.5 MG/0.5ML SOPN INJECT 1/2 MILLILITER(S) SUBCUTANEOUS ONCE A WEEK 09/25/22   [provider]  XARELTO  20 MG TABS tablet Take 20 mg by mouth daily.  07/15/18   [provider]   Allergies  Allergen Reactions   Procaine Hcl Nausea And Vomiting    Per Pharmacy 09/09/2019, Okay for Lidocaine  and Bupivicaine.    FAMILY HISTORY:  family history includes Alcohol abuse in his father; CAD in his father; Coronary artery disease in his sister and another family member; Diabetes in his brother, mother, and sister; Hypertension in his brother and mother. SOCIAL HISTORY:  reports that he has never smoked. He has never used smokeless tobacco. He reports current alcohol use of about 1.0 standard drink of alcohol per week. He reports that he does not use drugs.   Review of Systems:  Gen:  Denies  fever, sweats, chills weight loss  HEENT: Denies blurred vision, double vision, ear pain, eye pain, hearing loss, nose bleeds, sore throat Cardiac:  No dizziness, chest pain or heaviness, chest tightness,edema, No JVD Resp:   No cough, -sputum production, -shortness of breath,-wheezing, -hemoptysis,  Gi: Denies swallowing difficulty, stomach pain, nausea or vomiting, diarrhea, constipation, bowel incontinence Gu:  Denies bladder incontinence, burning urine Ext:   Denies Joint pain, stiffness or swelling Skin: Denies  skin rash, easy bruising or bleeding or hives Endoc:  Denies polyuria, polydipsia , polyphagia or weight change Psych:   Denies depression, insomnia or hallucinations  Other:  All other systems negative   ALL OTHER ROS ARE NEGATIVE   BP 120/60 (BP Location:  Left Arm, Patient Position: Sitting, Cuff Size: Normal)   Pulse 69   Temp 97.6 F (36.4 C) (Temporal)   Ht 6' 3 (1.905 m)   Wt 237 lb 12.8 oz (107.9 kg)   SpO2 96%   BMI 29.72 kg/m     Physical Examination:   General Appearance: No distress  EYES PERRLA, EOM intact.   NECK Supple, No JVD Pulmonary: normal breath sounds, No wheezing.  CardiovascularNormal S1,S2.  No m/r/g.   Abdomen: Benign, Soft, non-tender. Skin:   warm, no rashes, no ecchymosis  Extremities: normal, no cyanosis, clubbing. Neuro:without focal findings,  speech normal  PSYCHIATRIC: Mood, affect within normal limits.   ALL OTHER ROS ARE NEGATIVE    ASSESSMENT AND PLAN SYNOPSIS 59 year old obese white male seen today for signs and symptoms of underlying moderate sleep apnea based on previous testing in 2023 at the weight of 280 pounds Patient currently weighs 230 pounds and therefore will need repeat sleep study to assess severity of sleep apnea test for interval changes  Plan for home sleep study and assess changes in AHI  Obesity -recommend significant weight loss -recommend changing diet  Deconditioned state -Recommend increased daily activity and  exercise  Hypertension - Sleep apnea can contribute to hypertension, therefore treatment of sleep apnea is important part of hypertension management.  Diabetes Mellitis - Sleep apnea can contribute to DM, therefore treatment of sleep apnea is important part of DM management.  Atrial Fibrillation - Sleep apnea can contribute to Atrial Fibrillation, therefore treatment of sleep apnea is important part of A fib management.  MEDICATION ADJUSTMENTS/LABS AND TESTS ORDERED: Recommending a repeat home sleep test due to the fact that you lost a significant amount of weight Continue current weight loss journey Avoid Allergens and Irritants Avoid secondhand smoke Avoid SICK contacts Recommend  Masking  when appropriate Recommend Keep up-to-date with  vaccinations   CURRENT MEDICATIONS REVIEWED AT LENGTH WITH PATIENT TODAY   Patient  satisfied with Plan of action and management. All questions answered  Follow up  3 months  Total Time Spent  63 mins   Nickolas Alm Cellar, M.D.  [facility]  Medical Director [facility] Medical Director [facility] Cardio-Pulmonary Department

## 2023-12-23 NOTE — Patient Instructions (Signed)
 Recommending a repeat home sleep test due to the fact that you lost a significant amount of weight  Continue current weight loss journey  Avoid Allergens and Irritants Avoid secondhand smoke Avoid SICK contacts Recommend  Masking  when appropriate Recommend Keep up-to-date with vaccinations

## 2024-01-01 ENCOUNTER — Encounter: Payer: Self-pay | Admitting: Dermatology

## 2024-01-05 ENCOUNTER — Encounter: Payer: Self-pay | Admitting: Dermatology

## 2024-01-05 ENCOUNTER — Ambulatory Visit: Payer: BC Managed Care – PPO | Admitting: Dermatology

## 2024-01-05 VITALS — BP 112/58 | HR 53 | Temp 97.8°F

## 2024-01-05 DIAGNOSIS — C439 Malignant melanoma of skin, unspecified: Secondary | ICD-10-CM

## 2024-01-05 DIAGNOSIS — D0339 Melanoma in situ of other parts of face: Secondary | ICD-10-CM | POA: Diagnosis not present

## 2024-01-05 DIAGNOSIS — L579 Skin changes due to chronic exposure to nonionizing radiation, unspecified: Secondary | ICD-10-CM | POA: Diagnosis not present

## 2024-01-05 DIAGNOSIS — L814 Other melanin hyperpigmentation: Secondary | ICD-10-CM | POA: Diagnosis not present

## 2024-01-05 MED ORDER — TRAMADOL HCL 50 MG PO TABS: 50.0000 mg | ORAL_TABLET | Freq: Four times a day (QID) | ORAL | 0 refills | Status: DC | PRN

## 2024-01-05 NOTE — Patient Instructions (Signed)
Wound Care Instructions for After Surgery  On the day following your surgery, you should begin doing daily dressing changes until your sutures are removed: Remove the bandage. Cleanse the wound gently with soap and water.  Make sure you then dry the skin surrounding the wound completely or the tape will not stick to the skin. Do not use cotton balls on the wound. After the wound is clean and dry, apply the ointment (either prescription antibiotic prescribed by your doctor or plain Vaseline if nothing was prescribed) gently with a Q-tip. If you are using a bandaid to cover: Apply a bandaid large enough to cover the entire wound. If you do not have a bandaid large enough to cover the wound OR if you are sensitive to bandaid adhesive: Cut a non-stick pad (such as Telfa) to fit the size of the wound.  Cover the wound with the non-stick pad. If the wound is draining, you may want to add a small amount of gauze on top of the non-stick pad for a little added compression to the area. Use tape to seal the area completely.  For the next 1-2 weeks: Be sure to keep the wound moist with ointment 24/7 to ensure best healing. If you are unable to cover the wound with a bandage to hold the ointment in place, you may need to reapply the ointment several times a day. Do not bend over or lift heavy items to reduce the chance of elevated blood pressure to the wound. Do not participate in particularly strenuous activities.  Below is a list of dressing supplies you might need.  Cotton-tipped applicators - Q-tips Gauze pads (2x2 and/or 4x4) - All-Purpose Sponges New and clean tube of petroleum jelly (Vaseline) OR prescription antibiotic ointment if prescribed Either a bandaid large enough to cover the entire wound OR non-stick dressing material (Telfa) and Tape (Paper or Hypafix)  FOR ADULT SURGERY PATIENTS: If you need something for pain relief, you may take 1 extra strength Tylenol (acetaminophen) and 2  ibuprofen (200 mg) together every 4 hours as needed. (Do not take these medications if you are allergic to them or if you know you cannot take them for any other reason). Typically you may only need pain medication for 1-3 days.   Comments on the Post-Operative Period Slight swelling and redness often appear around the wound. This is normal and will disappear within several days following the surgery. The healing wound will drain a brownish-red-yellow discharge during healing. This is a normal phase of wound healing. As the wound begins to heal, the drainage may increase in amount. Again, this drainage is normal. Notify us if the drainage becomes persistently bloody, excessively swollen, or intensely painful or develops a foul odor or red streaks.  The healing wound will also typically be itchy. This is normal. If you have severe or persistent pain, Notify us if the discomfort is severe or persistent. Avoid alcoholic beverages when taking pain medicine.  In Case of Wound Hemorrhage A wound hemorrhage is when the bandage suddenly becomes soaked with bright red blood and flows profusely. If this happens, sit down or lie down with your head elevated. If the wound has a dressing on it, do not remove the dressing. Apply pressure to the existing gauze. If the wound is not covered, use a gauze pad to apply pressure and continue applying the pressure for 20 minutes without peeking. DO NOT COVER THE WOUND WITH A LARGE TOWEL OR WASH CLOTH. Release your hand from the  wound site but do not remove the dressing. If the bleeding has stopped, gently clean around the wound. Leave the dressing in place for 24 hours if possible. This wait time allows the blood vessels to close off so that you do not spark a new round of bleeding by disrupting the newly clotted blood vessels with an immediate dressing change. If the bleeding does not subside, continue to hold pressure for 40 minutes. If bleeding continues, page your  physician, contact an After Hours clinic or go to the Emergency Room.    Important Information  Due to recent changes in healthcare laws, you may see results of your pathology and/or laboratory studies on MyChart before the doctors have had a chance to review them. We understand that in some cases there may be results that are confusing or concerning to you. Please understand that not all results are received at the same time and often the doctors may need to interpret multiple results in order to provide you with the best plan of care or course of treatment. Therefore, we ask that you please give [place] 2 business days to thoroughly review all your results before contacting the office for clarification. Should we see a critical lab result, you will be contacted sooner.   If You Need Anything After Your Visit  If you have any questions or concerns for your doctor, please call our main line at [phone] If no one answers, please leave a voicemail as directed and we will return your call as soon as possible. Messages left after 4 pm will be answered the following business day.   You may also send [place] a message via MyChart. We typically respond to MyChart messages within 1-2 business days.  For prescription refills, please ask your pharmacy to contact our office. Our fax number is [phone].  If you have an urgent issue when the clinic is closed that cannot wait until the next business day, you can page your doctor at the number below.    Please note that while we do our best to be available for urgent issues outside of office hours, we are not available 24/7.   If you have an urgent issue and are unable to reach [place], you may choose to seek medical care at your doctor's office, retail clinic, urgent care center, or emergency room.  If you have a medical emergency, please immediately call 911 or go to the emergency department. In the event of inclement weather, please call our main line at  [phone] for an update on the status of any delays or closures.  Dermatology Medication Tips: Please keep the boxes that topical medications come in in order to help keep track of the instructions about where and how to use these. Pharmacies typically print the medication instructions only on the boxes and not directly on the medication tubes.   If your medication is too expensive, please contact our office at [phone] or send [place] a message through MyChart.   We are unable to tell what your co-pay for medications will be in advance as this is different depending on your insurance coverage. However, we may be able to find a substitute medication at lower cost or fill out paperwork to get insurance to cover a needed medication.   If a prior authorization is required to get your medication covered by your insurance company, please allow [place] 1-2 business days to complete this process.  Drug prices often vary depending on where the prescription is filled and  some pharmacies may offer cheaper prices.  The website www.goodrx.com contains coupons for medications through different pharmacies. The prices here do not account for what the cost may be with help from insurance (it may be cheaper with your insurance), but the website can give you the price if you did not use any insurance.  - You can print the associated coupon and take it with your prescription to the pharmacy.  - You may also stop by our office during regular business hours and pick up a GoodRx coupon card.  - If you need your prescription sent electronically to a different pharmacy, notify our office through [facility] or by phone at [phone]

## 2024-01-05 NOTE — Progress Notes (Unsigned)
Follow-Up Visit   Subjective  Charles [name] is a 58 y.o. male who presents for the following: Mohs of a Melanoma In Situ on the right cheek, referred by Dr. Katrinka Blazing. Patient states that this is his first melanoma.  The following portions of the chart were reviewed this encounter and updated as appropriate: medications, allergies, medical history  Review of Systems:  No other skin or systemic complaints except as noted in HPI or Assessment and Plan.  Objective  Well appearing patient in no apparent distress; mood and affect are within normal limits.  A focused examination was performed of the following areas:  Right cheek  Relevant physical exam findings are noted in the Assessment and Plan.   Right Buccal Cheek MIS   Assessment & Plan   MALIGNANT MELANOMA OF SKIN (HCC) Right Buccal Cheek Mohs surgery  Consent obtained: written  Anticoagulation: Is the patient taking prescription anticoagulant and/or aspirin prescribed/recommended by a physician? Yes   Was the anticoagulation regimen changed prior to Mohs? No    Procedure Details: Timeout: pre-procedure verification complete Procedure Prep: patient was prepped and draped in usual sterile fashion Prep type: chlorhexidine Biopsy accession number: [id number] Biopsy lab: [facility] Date of biopsy: 12/15/2023 Pre-Op diagnosis: melanoma Melanoma subtype: in situ MohsAIQ Surgical site (if tumor spans multiple areas, please select predominant area): cheek (including jawline) Surgery side: right Surgical site (from skin exam): Right Buccal Cheek Pre-operative length (cm): 0.8 Pre-operative width (cm): 0.9 Indications for Mohs surgery: anatomic location where tissue conservation is critical Previously treated? No    Micrographic Surgery Details: Post-operative length (cm): 1.9 Post-operative width (cm): 1.4 Number of Mohs stages: 1 Indication for sending permanent sections: confirm final margin and allow for  special stains Is this a complex case (associate members only): No    Stage 1    Tumor features identified on Mohs section: no tumor identified    Depth of defect after stage: subcutaneous fat    Perineural invasion: no perineural invasion  Patient tolerance of procedure: tolerated well, no immediate complications  Reconstruction: Was the defect reconstructed? Yes   Was reconstruction performed by the same Mohs surgeon? Yes   Setting of reconstruction: outpatient office When was reconstruction performed? same day Type of reconstruction: linear Length of linear repair (cm): 5  Opioids: Did the patient receive a prescription for opioid/narcotic related to Mohs surgery? Yes   Indications for opioid/narcotics: patient required additional pain relief despite trial of non-opioid analgesia  Antibiotics: Does patient meet AHA guidelines for endocarditis?: No   Does patient meet AHA guidelines for orthopedic prophylaxis?: No   Were antibiotics given on the day of surgery?: No   Did surgery breach mucosa, expose cartilage/bone, involve an area of lymphedema/inflamed/infected tissue? No    Skin repair Complexity:  Complex Final length (cm):  5.2 Informed consent: discussed and consent obtained   Timeout: patient name, date of birth, surgical site, and procedure verified   Procedure prep:  Patient was prepped and draped in usual sterile fashion Prep type:  Chlorhexidine Anesthesia: the lesion was anesthetized in a standard fashion   Anesthetic:  1% lidocaine w/ epinephrine 1-100,000 buffered w/ 8.4% NaHCO3 Reason for type of repair: reduce tension to allow closure, allow closure of the large defect, preserve normal anatomy and allow side-to-side closure without requiring a flap or graft   Undermining: area extensively undermined   Subcutaneous layers (deep stitches):  Suture size:  5-0 Suture type: Monocryl (poliglecaprone 25)   Stitches:  Buried vertical  mattress Fine/surface layer  approximation (top stitches):  Suture size:  5-0 Suture type: fast-absorbing plain gut   Stitches: simple running   Hemostasis achieved with: suture, pressure and electrodesiccation Outcome: patient tolerated procedure well with no complications   Post-procedure details: sterile dressing applied and wound care instructions given   Dressing type: petrolatum and pressure dressing   Related Medications traMADol (ULTRAM) 50 MG tablet Take 1 tablet (50 mg total) by mouth every 6 (six) hours as needed for up to 8 doses.   Return in about 4 weeks (around 02/02/2024) for Mohs f/u.  Dominga Ferry, Surg Tech III, am acting as scribe for Gwenith Daily, MD.    01/06/2024  HISTORY OF PRESENT ILLNESS  Charles [name] is seen in consultation at the request of Dr. Katrinka Blazing for biopsy-proven Melanoma In Situ. They note that the area has been present for about 6 months increasing in size with time.  There is no history of previous treatment.  Reports no other new or changing lesions and has no other complaints today.  Medications and allergies: see patient chart.  Review of systems: Reviewed 8 systems and notable for the above skin cancer.  All other systems reviewed are unremarkable/negative, unless noted in the HPI. Past medical history, surgical history, family history, social history were also reviewed and are noted in the chart/questionnaire.    PHYSICAL EXAMINATION  General: Well-appearing, in no acute distress, alert and oriented x 4. Vitals reviewed in chart (if available).   Skin: Exam reveals a 0.8 x 0.9 cm erythematous papule and biopsy scar on the right cheek. There are rhytids, telangiectasias, and lentigines, consistent with photodamage.   Biopsy report(s) reviewed, confirming the diagnosis.   ASSESSMENT  1) Melanoma In Situ of the right cheek 2) photodamage 3) solar lentigines   PLAN   1. Due to location, size, histology, or recurrence and the likelihood of subclinical extension  as well as the need to conserve normal surrounding tissue, the patient was deemed acceptable for Mohs micrographic surgery (MMS).  The nature and purpose of the procedure, associated benefits and risks including recurrence and scarring, possible complications such as pain, infection, and bleeding, and alternative methods of treatment if appropriate were discussed with the patient during consent. The lesion location was verified by the patient, by reviewing previous notes, pathology reports, and by photographs as well as angulation measurements if available.  Informed consent was reviewed and signed by the patient, and timeout was performed at 8:30 AM. See op note below.  2. For the photodamage and solar lentigines, sun protection discussed/information given on OTC sunscreens, and we recommend continued regular follow-up with primary dermatologist every 6 months or sooner for any growing, bleeding, or changing lesions. 3. Prognosis and future surveillance discussed. 4. Letter with treatment outcome sent to referring provider. 5. Pain acetaminophen/ibuprofen/tramadol 50 mg   MOHS MICROGRAPHIC SURGERY AND RECONSTRUCTION  Initial size:   0.8 x 0.9 cm Surgical defect/wound size: 1.9 x 1.4 cm Anesthesia:    0.33% lidocaine with 1:200,000 epinephrine EBL:    <5 mL Complications:  None Repair type:   Complex SQ suture:   5-0 Monocryl Cutaneous suture:  5-0 Fast Absorbing Gut Final size of the repair: 5.2 cm  Stages: 1  STAGE I: Anesthesia achieved with 0.5% lidocaine with 1:200,000 epinephrine. ChloraPrep applied. 5 section(s) excised using Mohs technique (this includes total peripheral and deep tissue margin excision and evaluation with frozen sections, excised and interpreted by the same physician). The tumor was  first debulked and then excised with an approx. 2mm margin.  Hemostasis was achieved with electrocautery as needed.  The specimen was then oriented, subdivided/relaxed, inked, and processed  using Mohs technique.  Tissue was stained with H&E and MART-1 and PRAME immunostains (2 separate antibodies).  Frozen section analysis revealed a clear deep and peripheral margin.  Reconstruction  The surgical wound was then cleaned, prepped, and re-anesthetized as above. Wound edges were undermined extensively along at least one entire edge and at a distance equal to or greater than the width of the defect (see wound defect size above) in order to achieve closure and decrease wound tension and anatomic distortion. Redundant tissue repair including standing cone removal was performed. Hemostasis was achieved with electrocautery. Subcutaneous and epidermal tissues were approximated with the above sutures. The surgical site was then lightly scrubbed with sterile, saline-soaked gauze. the area was then bandaged using Vaseline ointment, non-adherent gauze, gauze pads, and tape to provide an adequate pressure dressing. The patient tolerated the procedure well, was given detailed written and verbal wound care instructions, and was discharged in good condition.   The patient will follow-up: 4 weeks.   Documentation: I have reviewed the above documentation for accuracy and completeness, and I agree with the above.  Gwenith Daily, MD

## 2024-01-08 ENCOUNTER — Encounter: Payer: Self-pay | Admitting: Dermatology

## 2024-01-13 ENCOUNTER — Other Ambulatory Visit (HOSPITAL_COMMUNITY): Payer: Self-pay | Admitting: Internal Medicine

## 2024-01-29 ENCOUNTER — Ambulatory Visit: Payer: BC Managed Care – PPO | Admitting: Dermatology

## 2024-02-12 ENCOUNTER — Encounter: Payer: Self-pay | Admitting: Dermatology

## 2024-02-12 ENCOUNTER — Ambulatory Visit: Payer: BC Managed Care – PPO | Admitting: Dermatology

## 2024-02-12 DIAGNOSIS — D044 Carcinoma in situ of skin of scalp and neck: Secondary | ICD-10-CM | POA: Diagnosis not present

## 2024-02-12 DIAGNOSIS — D492 Neoplasm of unspecified behavior of bone, soft tissue, and skin: Secondary | ICD-10-CM | POA: Diagnosis not present

## 2024-02-12 DIAGNOSIS — D099 Carcinoma in situ, unspecified: Secondary | ICD-10-CM

## 2024-02-12 DIAGNOSIS — C44619 Basal cell carcinoma of skin of left upper limb, including shoulder: Secondary | ICD-10-CM

## 2024-02-12 DIAGNOSIS — Z1283 Encounter for screening for malignant neoplasm of skin: Secondary | ICD-10-CM

## 2024-02-12 DIAGNOSIS — C44519 Basal cell carcinoma of skin of other part of trunk: Secondary | ICD-10-CM | POA: Diagnosis not present

## 2024-02-12 DIAGNOSIS — L814 Other melanin hyperpigmentation: Secondary | ICD-10-CM

## 2024-02-12 DIAGNOSIS — L570 Actinic keratosis: Secondary | ICD-10-CM

## 2024-02-12 DIAGNOSIS — C4491 Basal cell carcinoma of skin, unspecified: Secondary | ICD-10-CM

## 2024-02-12 DIAGNOSIS — L578 Other skin changes due to chronic exposure to nonionizing radiation: Secondary | ICD-10-CM

## 2024-02-12 DIAGNOSIS — D229 Melanocytic nevi, unspecified: Secondary | ICD-10-CM

## 2024-02-12 DIAGNOSIS — S20311A Abrasion of right front wall of thorax, initial encounter: Secondary | ICD-10-CM

## 2024-02-12 DIAGNOSIS — L821 Other seborrheic keratosis: Secondary | ICD-10-CM

## 2024-02-12 DIAGNOSIS — W908XXA Exposure to other nonionizing radiation, initial encounter: Secondary | ICD-10-CM

## 2024-02-12 DIAGNOSIS — Q833 Accessory nipple: Secondary | ICD-10-CM

## 2024-02-12 DIAGNOSIS — D1801 Hemangioma of skin and subcutaneous tissue: Secondary | ICD-10-CM

## 2024-02-12 HISTORY — DX: Basal cell carcinoma of skin, unspecified: C44.91

## 2024-02-12 HISTORY — DX: Carcinoma in situ, unspecified: D09.9

## 2024-02-12 NOTE — Patient Instructions (Addendum)
 Wound Care Instructions  Cleanse wound gently with soap and water once a day then pat dry with clean gauze. Apply a thin coat of Petrolatum (petroleum jelly, "Vaseline") over the wound (unless you have an allergy to this). We recommend that you use a new, sterile tube of Vaseline. Do not pick or remove scabs. Do not remove the yellow or white "healing tissue" from the base of the wound.  Cover the wound with fresh, clean, nonstick gauze and secure with paper tape. You may use Band-Aids in place of gauze and tape if the wound is small enough, but would recommend trimming much of the tape off as there is often too much. Sometimes Band-Aids can irritate the skin.  You should call the office for your biopsy report after 1 week if you have not already been contacted.  If you experience any problems, such as abnormal amounts of bleeding, swelling, significant bruising, significant pain, or evidence of infection, please call the office immediately.  FOR ADULT SURGERY PATIENTS: If you need something for pain relief you may take 1 extra strength Tylenol (acetaminophen) AND 2 Ibuprofen (200mg  each) together every 4 hours as needed for pain. (do not take these if you are allergic to them or if you have a reason you should not take them.) Typically, you may only need pain medication for 1 to 3 days.   Start 5-fluorouracil/calcipotriene cream twice a day 5-7 days until get reaction to affected areas including face.   Due to recent changes in healthcare laws, you may see results of your pathology and/or laboratory studies on MyChart before the doctors have had a chance to review them. We understand that in some cases there may be results that are confusing or concerning to you. Please understand that not all results are received at the same time and often the doctors may need to interpret multiple results in order to provide you with the best plan of care or course of treatment. Therefore, we ask that you please  give [place] 2 business days to thoroughly review all your results before contacting the office for clarification. Should we see a critical lab result, you will be contacted sooner.   If You Need Anything After Your Visit  If you have any questions or concerns for your doctor, please call our main line at [phone] and press option 4 to reach your doctor's medical assistant. If no one answers, please leave a voicemail as directed and we will return your call as soon as possible. Messages left after 4 pm will be answered the following business day.   You may also send [place] a message via MyChart. We typically respond to MyChart messages within 1-2 business days.  For prescription refills, please ask your pharmacy to contact our office. Our fax number is [phone].  If you have an urgent issue when the clinic is closed that cannot wait until the next business day, you can page your doctor at the number below.    Please note that while we do our best to be available for urgent issues outside of office hours, we are not available 24/7.   If you have an urgent issue and are unable to reach [place], you may choose to seek medical care at your doctor's office, retail clinic, urgent care center, or emergency room.  If you have a medical emergency, please immediately call 911 or go to the emergency department.  Pager Numbers  - Dr. Gwen Pounds: [phone]  - Dr. Roseanne Reno: [phone]  -  Dr. Katrinka Blazing: [phone]   In the event of inclement weather, please call our main line at [phone] for an update on the status of any delays or closures.  Dermatology Medication Tips: Please keep the boxes that topical medications come in in order to help keep track of the instructions about where and how to use these. Pharmacies typically print the medication instructions only on the boxes and not directly on the medication tubes.   If your medication is too expensive, please contact our office at [phone]  option 4 or send [place] a message through MyChart.   We are unable to tell what your co-pay for medications will be in advance as this is different depending on your insurance coverage. However, we may be able to find a substitute medication at lower cost or fill out paperwork to get insurance to cover a needed medication.   If a prior authorization is required to get your medication covered by your insurance company, please allow [place] 1-2 business days to complete this process.  Drug prices often vary depending on where the prescription is filled and some pharmacies may offer cheaper prices.  The website www.goodrx.com contains coupons for medications through different pharmacies. The prices here do not account for what the cost may be with help from insurance (it may be cheaper with your insurance), but the website can give you the price if you did not use any insurance.  - You can print the associated coupon and take it with your prescription to the pharmacy.  - You may also stop by our office during regular business hours and pick up a GoodRx coupon card.  - If you need your prescription sent electronically to a different pharmacy, notify our office through [facility] or by phone at [phone] option 4.     Si Usted Necesita Algo Despus de Su Visita  Tambin puede enviarnos un mensaje a travs de Clinical cytogeneticist. Por lo general respondemos a los mensajes de MyChart en el transcurso de 1 a 2 das hbiles.  Para renovar recetas, por favor pida a su farmacia que se ponga en contacto con nuestra oficina. Annie Sable de fax es [place] [phone].  Si tiene un asunto urgente cuando la clnica est cerrada y que no puede esperar hasta el siguiente da hbil, puede llamar/localizar a su doctor(a) al nmero que aparece a continuacin.   Por favor, tenga en cuenta que aunque hacemos todo lo posible para estar disponibles para asuntos urgentes fuera del horario de [place], no estamos disponibles las  24 horas del da, los 7 [address]  [address] de la [place].   Si tiene un problema urgente y no puede comunicarse con nosotros, puede optar por buscar atencin mdica  en el consultorio de su doctor(a), en una clnica privada, en un centro de atencin urgente o en una sala de emergencias.  Si tiene Engineer, drilling, por favor llame inmediatamente al 911 o vaya a la sala de emergencias.  Nmeros de bper  - Dr. Gwen Pounds: [phone]  - Dra. Roseanne Reno: [phone]  - Dr. Katrinka Blazing: [phone]   En caso de inclemencias del tiempo, por favor llame a Lacy Duverney principal al [phone] para una actualizacin sobre el [place] de cualquier retraso o cierre.  Consejos para la medicacin en dermatologa: Por favor, guarde las cajas en las que vienen los medicamentos de uso tpico para ayudarle a seguir las instrucciones sobre dnde y cmo usarlos. Las farmacias generalmente imprimen las instrucciones del medicamento slo en las cajas y no directamente en  los tubos del medicamento.   Si su medicamento es muy caro, por favor, pngase en contacto con Rolm Gala llamando al [phone] y presione la opcin 4 o envenos un mensaje a travs de Clinical cytogeneticist.   No podemos decirle cul ser su copago por los medicamentos por adelantado ya que esto es diferente dependiendo de la cobertura de su seguro. Sin embargo, es posible que podamos encontrar un medicamento sustituto a Audiological scientist un formulario para que el seguro cubra el medicamento que se considera necesario.   Si se requiere una autorizacin previa para que su compaa de seguros [place] su medicamento, por favor permtanos de 1 a 2 das hbiles para completar [address]  Los precios de los medicamentos varan con frecuencia dependiendo del Environmental consultant de dnde se surte la receta y alguna farmacias pueden ofrecer precios ms baratos.  El sitio web www.goodrx.com tiene cupones para medicamentos de Health and safety inspector. Los precios aqu no tienen en cuenta  lo que podra costar con la ayuda del seguro (puede ser ms barato con su seguro), pero el sitio web puede darle el precio si no utiliz Tourist information centre manager.  - Puede imprimir el cupn correspondiente y llevarlo con su receta a la farmacia.  - Tambin puede pasar por nuestra oficina durante el horario de atencin regular y Education officer, museum una tarjeta de cupones de GoodRx.  - Si necesita que su receta se enve electrnicamente a una farmacia diferente, informe a nuestra oficina a travs de MyChart de [place] o por telfono llamando al [phone] y presione la opcin 4.

## 2024-02-12 NOTE — Progress Notes (Signed)
 Follow-Up Visit   Subjective  Charles [name] is a 58 y.o. male who presents for the following: Skin Cancer Screening and Full Body Skin Exam hx of Melanoma IS, BCC, Aks,  pt did not use the 5FU/Calcipotriene to face yet, he does have the cream  The patient presents for Total-Body Skin Exam (TBSE) for skin cancer screening and mole check. The patient has spots, moles and lesions to be evaluated, some may be new or changing and the patient may have concern these could be cancer.    The following portions of the chart were reviewed this encounter and updated as appropriate: medications, allergies, medical history  Review of Systems:  No other skin or systemic complaints except as noted in HPI or Assessment and Plan.  Objective  Well appearing patient in no apparent distress; mood and affect are within normal limits.  A full examination was performed including scalp, head, eyes, ears, nose, lips, neck, chest, axillae, abdomen, back, buttocks, bilateral upper extremities, bilateral lower extremities, hands, feet, fingers, toes, fingernails, and toenails. All findings within normal limits unless otherwise noted below.   Relevant physical exam findings are noted in the Assessment and Plan.  L forearm Pink thin papule with peripheral pigment 1.0cm  Right Upper Back Pearly pink thin plaque 12.42mm  L lat neck 7.29mm scaly erythemateous macule and 9.12mm scaly erythemateous macule within scar  L mid abdomen 4.62mm pink pearly pap   Assessment & Plan   SKIN CANCER SCREENING PERFORMED TODAY.  ACTINIC DAMAGE WITH PRECANCEROUS ACTINIC KERATOSES (previously discussed) Counseling for Topical Chemotherapy Management: Patient exhibits: - Severe, confluent actinic changes with pre-cancerous actinic keratoses that is secondary to cumulative UV radiation exposure over time - Condition that is severe; chronic, not at goal. - diffuse scaly erythematous macules and papules with underlying  dyspigmentation - Discussed Prescription "Field Treatment" topical Chemotherapy for Severe, Chronic Confluent Actinic Changes with Pre-Cancerous Actinic Keratoses Field treatment involves treatment of an entire area of skin that has confluent Actinic Changes (Sun/ Ultraviolet light damage) and PreCancerous Actinic Keratoses by method of PhotoDynamic Therapy (PDT) and/or prescription Topical Chemotherapy agents such as 5-fluorouracil, 5-fluorouracil/calcipotriene, and/or imiquimod.  The purpose is to decrease the number of clinically evident and subclinical PreCancerous lesions to prevent progression to development of skin cancer by chemically destroying early precancer changes that may or may not be visible.  It has been shown to reduce the risk of developing skin cancer in the treated area. As a result of treatment, redness, scaling, crusting, and open sores may occur during treatment course. One or more than one of these methods may be used and may have to be used several times to control, suppress and eliminate the PreCancerous changes. Discussed treatment course, expected reaction, and possible side effects. - Recommend daily broad spectrum sunscreen SPF 30+ to sun-exposed areas, reapply every 2 hours as needed.  - Staying in the shade or wearing long sleeves, sun glasses (UVA+UVB protection) and wide brim hats (4-inch brim around the entire circumference of the hat) are also recommended. - Call for new or changing lesions.  - Start 5-fluorouracil/calcipotriene cream twice a day for 5 - 7 days until get reaction to affected areas including face. Patient has medication at home.  Patient provided with handout reviewing treatment course and side effects and advised to call or message [place] on MyChart with any concerns.  Reviewed course of treatment and expected reaction.  Patient advised to expect inflammation and crusting and advised that erosions are possible.  Patient advised to be diligent with sun  protection during and after treatment. Counseled to keep medication out of reach of children and pets.    LENTIGINES, SEBORRHEIC KERATOSES, HEMANGIOMAS - Benign normal skin lesions - Benign-appearing - Call for any changes  MELANOCYTIC NEVI - Tan-brown and/or pink-flesh-colored symmetric macules and papules - Benign appearing on exam today - Observation - Call clinic for new or changing moles - Recommend daily use of broad spectrum spf 30+ sunscreen to sun-exposed areas.   HISTORY OF MELANOMA IN SITU - No evidence of recurrence today - Recommend regular full body skin exams - Recommend daily broad spectrum sunscreen SPF 30+ to sun-exposed areas, reapply every 2 hours as needed.  - Call if any new or changing lesions are noted between office visits  - R cheek, mohs 01/05/2024  HISTORY OF BASAL CELL CARCINOMA OF THE SKIN - No evidence of recurrence today - Recommend regular full body skin exams - Recommend daily broad spectrum sunscreen SPF 30+ to sun-exposed areas, reapply every 2 hours as needed.  - Call if any new or changing lesions are noted between office visits  - R arm, chest, L neck  EXCORIATION 2ndary to trauma R upper chest Exam: R upper chest excoriated pink plaque  Treatment Plan: Benign, observe  ACCESSORY NIPPLE R abdomen Exam: accessory nipple R abdomen  Treatment Plan: Benign, Observe  NEOPLASM OF SKIN (4) L forearm Skin / nail biopsy Type of biopsy: tangential   Informed consent: discussed and consent obtained   Timeout: patient name, date of birth, surgical site, and procedure verified   Procedure prep:  Patient was prepped and draped in usual sterile fashion Prep type:  Isopropyl alcohol Anesthesia: the lesion was anesthetized in a standard fashion   Anesthetic:  1% lidocaine w/ epinephrine 1-100,000 buffered w/ 8.4% NaHCO3 Instrument used: DermaBlade   Hemostasis achieved with: pressure and aluminum chloride   Outcome: patient tolerated  procedure well   Post-procedure details: sterile dressing applied and wound care instructions given   Dressing type: bandage and bacitracin   Specimen 1 - Surgical pathology Differential Diagnosis: D48.5 SK r/o BCC  Check Margins: No Pink thin papule with peripheral pigment 1.0cm Right Upper Back Skin / nail biopsy Type of biopsy: tangential   Informed consent: discussed and consent obtained   Timeout: patient name, date of birth, surgical site, and procedure verified   Procedure prep:  Patient was prepped and draped in usual sterile fashion Prep type:  Isopropyl alcohol Anesthesia: the lesion was anesthetized in a standard fashion   Anesthetic:  1% lidocaine w/ epinephrine 1-100,000 buffered w/ 8.4% NaHCO3 Instrument used: DermaBlade   Hemostasis achieved with: pressure and aluminum chloride   Outcome: patient tolerated procedure well   Post-procedure details: sterile dressing applied and wound care instructions given   Dressing type: bandage and bacitracin   Specimen 2 - Surgical pathology Differential Diagnosis: R/O BCC  Check Margins: No Pearly pink thin plaque 12.81mm L lat neck Skin / nail biopsy Type of biopsy: tangential   Informed consent: discussed and consent obtained   Timeout: patient name, date of birth, surgical site, and procedure verified   Procedure prep:  Patient was prepped and draped in usual sterile fashion Prep type:  Isopropyl alcohol Anesthesia: the lesion was anesthetized in a standard fashion   Anesthetic:  1% lidocaine w/ epinephrine 1-100,000 buffered w/ 8.4% NaHCO3 Instrument used: DermaBlade   Hemostasis achieved with: pressure and aluminum chloride   Outcome: patient tolerated procedure well  Post-procedure details: sterile dressing applied and wound care instructions given   Dressing type: bandage and bacitracin   Specimen 3 - Surgical pathology Differential Diagnosis: BCC vs SCC  Check Margins: No 7.10mm scaly erythemateous macule within  scar and 9.26mm scaly erythemateous macule within scar L mid abdomen Skin / nail biopsy Type of biopsy: tangential   Informed consent: discussed and consent obtained   Timeout: patient name, date of birth, surgical site, and procedure verified   Procedure prep:  Patient was prepped and draped in usual sterile fashion Prep type:  Isopropyl alcohol Anesthesia: the lesion was anesthetized in a standard fashion   Anesthetic:  1% lidocaine w/ epinephrine 1-100,000 buffered w/ 8.4% NaHCO3 Instrument used: DermaBlade   Hemostasis achieved with: pressure and aluminum chloride   Outcome: patient tolerated procedure well   Post-procedure details: sterile dressing applied and wound care instructions given   Dressing type: bandage and bacitracin   Specimen 4 - Surgical pathology Differential Diagnosis: BCC vs SCC vs Accessory Nipple  Check Margins: No 4.53mm pink pearly pap MULTIPLE BENIGN NEVI   LENTIGINES   ACTINIC ELASTOSIS   SEBORRHEIC KERATOSES   CHERRY ANGIOMA   ACCESSORY NIPPLE   Return in about 3 months (around 05/14/2024) for TBSE, Hx of Melanoma IS, Hx of BCC, Hx of AKs.  I, Ardis Rowan, RMA, am acting as scribe for Elie Goody, MD .   Documentation: I have reviewed the above documentation for accuracy and completeness, and I agree with the above.  Elie Goody, MD

## 2024-02-16 ENCOUNTER — Ambulatory Visit: Payer: BC Managed Care – PPO | Admitting: Dermatology

## 2024-02-16 ENCOUNTER — Encounter: Payer: Self-pay | Admitting: Dermatology

## 2024-02-16 VITALS — BP 124/69 | HR 85

## 2024-02-16 DIAGNOSIS — L905 Scar conditions and fibrosis of skin: Secondary | ICD-10-CM

## 2024-02-16 DIAGNOSIS — D0330 Melanoma in situ of unspecified part of face: Secondary | ICD-10-CM

## 2024-02-16 DIAGNOSIS — Z86006 Personal history of melanoma in-situ: Secondary | ICD-10-CM | POA: Diagnosis not present

## 2024-02-16 NOTE — Progress Notes (Signed)
   Follow Up Visit   Subjective  Charles [name] is a 58 y.o. male who presents for the following: follow up from Mohs surgery   The patient presents for follow up from Mohs surgery for a MIS on the right buccal cheek, treated on 01/05/24, repaired with linear closure. The patient has been bandaging the wound as directed. The endorse the following concerns: none  The following portions of the chart were reviewed this encounter and updated as appropriate: medications, allergies, medical history  Review of Systems:  No other skin or systemic complaints except as noted in HPI or Assessment and Plan.  Objective  Well appearing patient in no apparent distress; mood and affect are within normal limits.  A full examination was performed including scalp, head, face. All findings within normal limits unless otherwise noted below.  Healing wound with mild erythema  Relevant physical exam findings are noted in the Assessment and Plan.     Assessment & Plan   Healing s/p Mohs for MIS, treated on 01/05/24, repaired with linear closure - Reassured that wound is healing well - No evidence of infection - No swelling, induration, purulence, dehiscence, or tenderness out of proportion to the clinical exam, see photo above - Discussed that scars take up to 12 months to mature from the date of surgery - Recommend SPF 30+ to scar daily to prevent purple color from UV exposure during scar maturation process - Discussed that erythema and raised appearance of scar will fade over the next 4-6 months - OK to start scar massage at 4-6 weeks post-op - Can consider silicone based products for scar healing starting at 6 weeks post-op - Ok to continue ointment daily to wound under a bandage for another week  HISTORY OF MELANOMA - No evidence of recurrence today - No lymphadenopathy - Recommend regular full body skin exams - Recommend daily broad spectrum sunscreen SPF 30+ to sun-exposed areas, reapply every 2  hours as needed.  - Call if any new or changing lesions are noted between office visits  Return if symptoms worsen or fail to improve.  I, Tillie Fantasia, CMA, am acting as scribe for Gwenith Daily, MD.   Documentation: I have reviewed the above documentation for accuracy and completeness, and I agree with the above.  Gwenith Daily, MD

## 2024-02-16 NOTE — Patient Instructions (Addendum)

## 2024-02-17 ENCOUNTER — Telehealth: Payer: Self-pay

## 2024-02-17 LAB — SURGICAL PATHOLOGY

## 2024-02-17 NOTE — Telephone Encounter (Signed)
 ----- Message from Charles [name] sent at 02/17/2024  5:10 PM EDT ----- Diagnosis 1. Skin, L forearm :       SUPERFICIAL AND NODULAR BASAL CELL CARCINOMA        2. Skin, right upper back :       SUPERFICIAL BASAL CELL CARCINOMA        3. Skin, left lat neck :       SQUAMOUS CELL CARCINOMA IN SITU        4. Skin, left mid abdomen :       SUPERFICIAL BASAL CELL CARCINOMA     Please call with diagnosis and message me with patient's decision on treatment.   1. Skin, L forearm Biopsy shows a basal cell skin cancer in the first and second layers of skin Treatment: excision  2. R upper back Biopsy shows a basal cell skin cancer in the top layer of skin Treatment: EDC vs imiquimod  Treatment option 1: you return for a brief appointment where I perform electrodesiccation and curettage (ED&C). This involves three rounds of scraping and burning to destroy the skin cancer. It has about an 85% cure rate and leaves a round wound slightly larger than the skin cancer and leaves a round white scar. No additional pathology is done. If the skin cancer comes back, we would need to do a surgery to remove it.   Treatment option 2: imiquimod is a cream that helps your immune system clear the skin cancer. You will apply the cream on weekdays for 6 weeks. It has an 88% cure rate. If redness and irritation develop, take 3 days off before restarting. If an open sore develops, stop the cream and send [place] a message on MyChart or call us.  If the cream does not clear the cancer, surgery will be required. ##please make 2 month follow up for recheck  For option 2: Free text instructions and copy paste: "apply cream to your skin cancer once daily on 5 days per week, prior to normal sleeping hours, for 6 weeks. Leave it on your skin for ~8 hours, then remove with mild soap and water. Apply enough cream to cover the treatment area and a quarter inch of skin surrounding the tumor. If redness and irritation develop, take  3 days off then restart." Prescribe 12 packets with 2 refills.  3. Left neck Biopsy shows a squamous cell skin cancer limited to the top layer of skin. It is a recurrence from a previous cancer that you had. it has not spread, but it has the potential to spread beyond the skin and threaten your health. Treatment: 5FU/calcipotriene vs Mohs  Treatment option 1: a cream (fluorouracil and calcipotriene) that helps your immune system clear the skin cancer. It will cause redness and irritation. Wait two weeks after the biopsy to start applying the cream. Apply the cream twice per day until the redness and irritation develop (usually occurs by day 7), then stop and allow it to heal. We will recheck the area in 2 months to ensure the cancer is gone. The cream is $45 plus shipping and will be mailed to you from a low cost compounding pharmacy.  Treatment option 2: Mohs surgery, which involves cutting out right around the skin cancer and then checking under the microscope on the same day to ensure the whole skin cancer is out. If there is more cancer remaining, the surgeon will repeat the process until it is fully cleared. The cure rate is about 98-99%.  It is done at another office outside of [place] ([place], [place], or [place]). Once the Mohs surgeon confirms the skin cancer is out, they will discuss the options to repair or heal the area. You must take it easy for about two weeks after surgery (no lifting over 10-15 lbs, avoid activity to get your heart rate and blood pressure up).  4. Left abdomen Biopsy shows a basal cell skin cancer in the top layer of skin Treatment: imiquimod vs EDC vs excision

## 2024-02-23 ENCOUNTER — Telehealth: Payer: Self-pay

## 2024-02-23 DIAGNOSIS — C44519 Basal cell carcinoma of skin of other part of trunk: Secondary | ICD-10-CM

## 2024-02-23 MED ORDER — IMIQUIMOD 5 % EX CREA: TOPICAL_CREAM | CUTANEOUS | 2 refills | Status: DC

## 2024-02-23 NOTE — Telephone Encounter (Signed)
 ----- Message from Charles [name] sent at 02/17/2024  5:10 PM EDT ----- Diagnosis 1. Skin, L forearm :       SUPERFICIAL AND NODULAR BASAL CELL CARCINOMA        2. Skin, right upper back :       SUPERFICIAL BASAL CELL CARCINOMA        3. Skin, left lat neck :       SQUAMOUS CELL CARCINOMA IN SITU        4. Skin, left mid abdomen :       SUPERFICIAL BASAL CELL CARCINOMA     Please call with diagnosis and message me with patient's decision on treatment.   1. Skin, L forearm Biopsy shows a basal cell skin cancer in the first and second layers of skin Treatment: excision  2. R upper back Biopsy shows a basal cell skin cancer in the top layer of skin Treatment: EDC vs imiquimod  Treatment option 1: you return for a brief appointment where I perform electrodesiccation and curettage (ED&C). This involves three rounds of scraping and burning to destroy the skin cancer. It has about an 85% cure rate and leaves a round wound slightly larger than the skin cancer and leaves a round white scar. No additional pathology is done. If the skin cancer comes back, we would need to do a surgery to remove it.   Treatment option 2: imiquimod is a cream that helps your immune system clear the skin cancer. You will apply the cream on weekdays for 6 weeks. It has an 88% cure rate. If redness and irritation develop, take 3 days off before restarting. If an open sore develops, stop the cream and send [place] a message on MyChart or call us.  If the cream does not clear the cancer, surgery will be required. ##please make 2 month follow up for recheck  For option 2: Free text instructions and copy paste: "apply cream to your skin cancer once daily on 5 days per week, prior to normal sleeping hours, for 6 weeks. Leave it on your skin for ~8 hours, then remove with mild soap and water. Apply enough cream to cover the treatment area and a quarter inch of skin surrounding the tumor. If redness and irritation develop, take  3 days off then restart." Prescribe 12 packets with 2 refills.  3. Left neck Biopsy shows a squamous cell skin cancer limited to the top layer of skin. It is a recurrence from a previous cancer that you had. it has not spread, but it has the potential to spread beyond the skin and threaten your health. Treatment: 5FU/calcipotriene vs Mohs  Treatment option 1: a cream (fluorouracil and calcipotriene) that helps your immune system clear the skin cancer. It will cause redness and irritation. Wait two weeks after the biopsy to start applying the cream. Apply the cream twice per day until the redness and irritation develop (usually occurs by day 7), then stop and allow it to heal. We will recheck the area in 2 months to ensure the cancer is gone. The cream is $45 plus shipping and will be mailed to you from a low cost compounding pharmacy.  Treatment option 2: Mohs surgery, which involves cutting out right around the skin cancer and then checking under the microscope on the same day to ensure the whole skin cancer is out. If there is more cancer remaining, the surgeon will repeat the process until it is fully cleared. The cure rate is about 98-99%.  It is done at another office outside of [place] ([place], [place], or [place]). Once the Mohs surgeon confirms the skin cancer is out, they will discuss the options to repair or heal the area. You must take it easy for about two weeks after surgery (no lifting over 10-15 lbs, avoid activity to get your heart rate and blood pressure up).  4. Left abdomen Biopsy shows a basal cell skin cancer in the top layer of skin Treatment: imiquimod vs EDC vs excision

## 2024-02-23 NOTE — Telephone Encounter (Signed)
 Reviewed pathology results with patient. Patient prefers to treat right upper back and left mid abdomen with imiquimod, sent in to [facility] in [place]. Patient prefers to treat left lat neck with 5FU/calcipotriene, already has. Offered him a 8:30 am surgery appt on 4/23 for left forearm but patient has just started a point system at work and wanted to know if you would be willing to do a 4:15 pm surgery. I told him I would ask but that your normal surgery appts are Wednesday mornings.  Butch Penny., RMA

## 2024-03-08 ENCOUNTER — Ambulatory Visit: Payer: BC Managed Care – PPO | Admitting: Internal Medicine

## 2024-03-31 ENCOUNTER — Encounter: Payer: Self-pay | Admitting: Dermatology

## 2024-03-31 ENCOUNTER — Ambulatory Visit: Admitting: Dermatology

## 2024-03-31 DIAGNOSIS — C44619 Basal cell carcinoma of skin of left upper limb, including shoulder: Secondary | ICD-10-CM | POA: Diagnosis not present

## 2024-03-31 MED ORDER — MUPIROCIN 2 % EX OINT: 1.0000 | TOPICAL_OINTMENT | Freq: Every day | CUTANEOUS | 0 refills | Status: DC

## 2024-03-31 NOTE — Patient Instructions (Signed)

## 2024-03-31 NOTE — Progress Notes (Unsigned)
   Follow-Up Visit   Subjective  Charles [name] is a 58 y.o. male who presents for the following: Excision of BCC at left forearm  The following portions of the chart were reviewed this encounter and updated as appropriate: medications, allergies, medical history  Review of Systems:  No other skin or systemic complaints except as noted in HPI or Assessment and Plan.  Objective  Well appearing patient in no apparent distress; mood and affect are within normal limits.  A focused examination was performed of the following areas: Left arm Relevant physical exam findings are noted in the Assessment and Plan.   Left Forearm Healing bx site  Assessment & Plan   BASAL CELL CARCINOMA (BCC) OF SKIN OF LEFT UPPER EXTREMITY INCLUDING SHOULDER Left Forearm Skin excision  Lesion length (cm):  1.2 Margin per side (cm):  0.4 Total excision diameter (cm):  2 Informed consent: discussed and consent obtained   Timeout: patient name, date of birth, surgical site, and procedure verified   Procedure prep:  Patient was prepped and draped in usual sterile fashion Prep type:  Chlorhexidine Anesthesia: the lesion was anesthetized in a standard fashion   Anesthetic:  1% lidocaine  w/ epinephrine 1-100,000 buffered w/ 8.4% NaHCO3 (9 cc) Instrument used: #15 blade   Hemostasis achieved with: pressure   Outcome: patient tolerated procedure well with no complications   Additional details:  Tagged superior  Skin repair Complexity:  Intermediate Final length (cm):  6.2 Informed consent: discussed and consent obtained   Timeout: patient name, date of birth, surgical site, and procedure verified   Procedure prep:  Patient was prepped and draped in usual sterile fashion Prep type:  Chlorhexidine Anesthesia: the lesion was anesthetized in a standard fashion   Anesthetic:  1% lidocaine  w/ epinephrine 1-100,000 buffered w/ 8.4% NaHCO3 Reason for type of repair: reduce tension to allow closure, reduce the  risk of dehiscence, infection, and necrosis, reduce subcutaneous dead space and avoid a hematoma, allow closure of the large defect and preserve normal anatomy   Undermining: edges could be approximated without difficulty   Subcutaneous layers (deep stitches):  Suture size:  4-0 Suture type: Monocryl (poliglecaprone 25)   Stitches:  Buried vertical mattress Fine/surface layer approximation (top stitches):  Suture size:  5-0 Suture type: Prolene (polypropylene)   Stitches: simple running   Suture removal (days):  14 Hemostasis achieved with: suture, pressure and electrodesiccation Outcome: patient tolerated procedure well with no complications   Post-procedure details: sterile dressing applied and wound care instructions given   Dressing type: petrolatum, bandage and pressure dressing   Specimen 1 - Surgical pathology Differential Diagnosis: BX proven BCC  Check Margins: yes Healing bx site [id number] Superior tag   Return in about 2 weeks (around 04/14/2024) for Suture Removal.  Kerstin Peeling, RMA, am acting as scribe for Harris Liming, MD .   Documentation: I have reviewed the above documentation for accuracy and completeness, and I agree with the above.  Harris Liming, MD

## 2024-04-01 ENCOUNTER — Encounter: Payer: Self-pay | Admitting: Dermatology

## 2024-04-02 ENCOUNTER — Telehealth (HOSPITAL_COMMUNITY): Payer: Self-pay | Admitting: Internal Medicine

## 2024-04-02 ENCOUNTER — Other Ambulatory Visit: Payer: Self-pay | Admitting: Internal Medicine

## 2024-04-02 DIAGNOSIS — R9439 Abnormal result of other cardiovascular function study: Secondary | ICD-10-CM

## 2024-04-02 DIAGNOSIS — I2510 Atherosclerotic heart disease of native coronary artery without angina pectoris: Secondary | ICD-10-CM

## 2024-04-02 DIAGNOSIS — G4733 Obstructive sleep apnea (adult) (pediatric): Secondary | ICD-10-CM

## 2024-04-02 DIAGNOSIS — I480 Paroxysmal atrial fibrillation: Secondary | ICD-10-CM

## 2024-04-02 DIAGNOSIS — I10 Essential (primary) hypertension: Secondary | ICD-10-CM

## 2024-04-02 DIAGNOSIS — I428 Other cardiomyopathies: Secondary | ICD-10-CM

## 2024-04-02 DIAGNOSIS — E7800 Pure hypercholesterolemia, unspecified: Secondary | ICD-10-CM

## 2024-04-02 NOTE — Telephone Encounter (Signed)
 Front office left patient a voicemail because received message from [place] T., CMA, that this patient is overdue for a f/u appt with Dr. Bensimhon. Instructed patient to call front office back to get scheduled.

## 2024-04-07 LAB — SURGICAL PATHOLOGY

## 2024-04-08 ENCOUNTER — Telehealth: Payer: Self-pay

## 2024-04-08 NOTE — Telephone Encounter (Signed)
 Left pt msg to call for bx results and update on wound./sh

## 2024-04-08 NOTE — Telephone Encounter (Signed)
 Discussed pathology results with patient. Pt states that wound is healing well, and other than being a little itchy he has no issues.

## 2024-04-08 NOTE — Telephone Encounter (Signed)
-----   Message from [place] sent at 04/08/2024  9:19 AM EDT ----- Diagnosis left forearm :       RESIDUAL BASAL CELL CARCINOMA, MARGINS FREE    Please call to share that excision removed remaining basal cell skin cancer and get update on surgical wound. Thank you.

## 2024-04-14 ENCOUNTER — Ambulatory Visit: Admitting: Dermatology

## 2024-04-14 ENCOUNTER — Encounter: Payer: Self-pay | Admitting: Dermatology

## 2024-04-14 ENCOUNTER — Telehealth (HOSPITAL_COMMUNITY): Payer: Self-pay

## 2024-04-14 ENCOUNTER — Other Ambulatory Visit: Payer: Self-pay | Admitting: Internal Medicine

## 2024-04-14 DIAGNOSIS — I428 Other cardiomyopathies: Secondary | ICD-10-CM

## 2024-04-14 DIAGNOSIS — Z4802 Encounter for removal of sutures: Secondary | ICD-10-CM

## 2024-04-14 DIAGNOSIS — Z5189 Encounter for other specified aftercare: Secondary | ICD-10-CM

## 2024-04-14 DIAGNOSIS — Z48817 Encounter for surgical aftercare following surgery on the skin and subcutaneous tissue: Secondary | ICD-10-CM

## 2024-04-14 DIAGNOSIS — I519 Heart disease, unspecified: Secondary | ICD-10-CM

## 2024-04-14 NOTE — Patient Instructions (Signed)

## 2024-04-14 NOTE — Progress Notes (Signed)
   Follow-Up Visit   Subjective  Charles [name] is a 58 y.o. male who presents for the following: Suture removal left forearm        Pathology showed RESIDUAL BASAL CELL CARCINOMA, MARGINS FREE  The following portions of the chart were reviewed this encounter and updated as appropriate: medications, allergies, medical history  Review of Systems:  No other skin or systemic complaints except as noted in HPI or Assessment and Plan.  Objective  Well appearing patient in no apparent distress; mood and affect are within normal limits.  Areas Examined: left arm   Relevant physical exam findings are noted in the Assessment and Plan.    Assessment & Plan   VISIT FOR WOUND CHECK   ENCOUNTER FOR REMOVAL OF SUTURES   Encounter for Removal of Sutures - Incision site is clean, dry and intact. - Wound cleansed, sutures removed, wound cleansed and steri strips applied.  - Discussed pathology results showing RESIDUAL BASAL CELL CARCINOMA, MARGINS FREE+ - Patient advised to keep steri-strips dry until they fall off. - Scars remodel for a full year. - Once steri-strips fall off, patient can apply over-the-counter silicone scar cream once to twice a day to help with scar remodeling if desired. - Patient advised to call with any concerns or if they notice any new or changing lesions.  Return for scheduled appt 05/20/2024.  IClara Crisp, CMA, am acting as scribe for Harris Liming, MD .   Documentation: I have reviewed the above documentation for accuracy and completeness, and I agree with the above.  Harris Liming, MD

## 2024-04-14 NOTE — Telephone Encounter (Signed)
 Called to confirm/remind patient of their appointment at the [facility] on 04/15/24.   Appointment:   [x] Confirmed  [] Left mess   [] No answer/No voice mail  [] VM Full/unable to leave message  [] Phone not in service  Patient reminded to bring all medications and/or complete list.  Confirmed patient has transportation. Gave directions, instructed to utilize valet parking.

## 2024-04-15 ENCOUNTER — Ambulatory Visit (HOSPITAL_COMMUNITY)
Admission: RE | Admit: 2024-04-15 | Discharge: 2024-04-15 | Disposition: A | Discharge status: Home / Self Care | Source: Ambulatory Visit | Attending: Cardiology | Admitting: Cardiology

## 2024-04-15 ENCOUNTER — Encounter (HOSPITAL_COMMUNITY): Payer: Self-pay

## 2024-04-15 ENCOUNTER — Other Ambulatory Visit (HOSPITAL_COMMUNITY): Payer: Self-pay | Admitting: Internal Medicine

## 2024-04-15 ENCOUNTER — Inpatient Hospital Stay (HOSPITAL_COMMUNITY)
Admission: RE | Admit: 2024-04-15 | Discharge: 2024-04-15 | Disposition: A | Discharge status: Home / Self Care | Source: Ambulatory Visit | Attending: Internal Medicine | Admitting: Internal Medicine

## 2024-04-15 VITALS — BP 96/66 | HR 76 | Wt 228.4 lb

## 2024-04-15 DIAGNOSIS — Z7901 Long term (current) use of anticoagulants: Secondary | ICD-10-CM | POA: Insufficient documentation

## 2024-04-15 DIAGNOSIS — I493 Ventricular premature depolarization: Secondary | ICD-10-CM

## 2024-04-15 DIAGNOSIS — I2510 Atherosclerotic heart disease of native coronary artery without angina pectoris: Secondary | ICD-10-CM | POA: Insufficient documentation

## 2024-04-15 DIAGNOSIS — I110 Hypertensive heart disease with heart failure: Secondary | ICD-10-CM | POA: Diagnosis not present

## 2024-04-15 DIAGNOSIS — Z79899 Other long term (current) drug therapy: Secondary | ICD-10-CM | POA: Insufficient documentation

## 2024-04-15 DIAGNOSIS — I5032 Chronic diastolic (congestive) heart failure: Secondary | ICD-10-CM | POA: Diagnosis not present

## 2024-04-15 DIAGNOSIS — E1151 Type 2 diabetes mellitus with diabetic peripheral angiopathy without gangrene: Secondary | ICD-10-CM | POA: Diagnosis not present

## 2024-04-15 DIAGNOSIS — E669 Obesity, unspecified: Secondary | ICD-10-CM | POA: Diagnosis not present

## 2024-04-15 DIAGNOSIS — Z6828 Body mass index (BMI) 28.0-28.9, adult: Secondary | ICD-10-CM | POA: Diagnosis not present

## 2024-04-15 DIAGNOSIS — Z7984 Long term (current) use of oral hypoglycemic drugs: Secondary | ICD-10-CM | POA: Insufficient documentation

## 2024-04-15 DIAGNOSIS — Z7985 Long-term (current) use of injectable non-insulin antidiabetic drugs: Secondary | ICD-10-CM | POA: Diagnosis not present

## 2024-04-15 DIAGNOSIS — G4733 Obstructive sleep apnea (adult) (pediatric): Secondary | ICD-10-CM | POA: Insufficient documentation

## 2024-04-15 DIAGNOSIS — I5022 Chronic systolic (congestive) heart failure: Secondary | ICD-10-CM | POA: Insufficient documentation

## 2024-04-15 DIAGNOSIS — I480 Paroxysmal atrial fibrillation: Secondary | ICD-10-CM | POA: Insufficient documentation

## 2024-04-15 NOTE — Patient Instructions (Addendum)
 Good to see you today!  No  medication changes were made   Your provider has recommended that  you wear a Zio Patch for 14 days.  This monitor will record your heart rhythm for our review.  IF you have any symptoms while wearing the monitor please press the button.  If you have any issues with the patch or you notice a red or orange light on it please call the company at [phone].  Once you remove the patch please mail it back to the company as soon as possible so we can get the results.  Your physician has requested that you have an echocardiogram. Echocardiography is a painless test that uses sound waves to create images of your heart. It provides your doctor with information about the size and shape of your heart and how well your heart's chambers and valves are working. This procedure takes approximately one hour. There are no restrictions for this procedure. Please do NOT wear cologne, perfume, aftershave, or lotions (deodorant is allowed). Please arrive 15 minutes prior to your appointment time.  Please note: We ask at that you not bring children with you during ultrasound (echo/ vascular) testing. Due to room size and safety concerns, children are not allowed in the ultrasound rooms during exams. Our front office staff cannot provide observation of children in our lobby area while testing is being conducted. An adult accompanying a patient to their appointment will only be allowed in the ultrasound room at the discretion of the ultrasound technician under special circumstances. We apologize for any inconvenience.  Your physician recommends that you schedule a follow-up appointment :4 months with echocardiogram(September) Call office in July to schedule an appointment  If you have any questions or concerns before your next appointment please send us  a message through [facility] or call our office at [phone].    TO LEAVE A MESSAGE FOR THE NURSE SELECT OPTION 2, PLEASE LEAVE A MESSAGE  INCLUDING: YOUR NAME DATE OF BIRTH CALL BACK NUMBER REASON FOR CALL**this is important as we prioritize the call backs  YOU WILL RECEIVE A CALL BACK THE SAME DAY AS LONG AS YOU CALL BEFORE 4:00 PM At the [facility], you and your health needs are our priority. As part of our continuing mission to provide you with exceptional heart care, we have created designated Provider Care Teams. These Care Teams include your primary Cardiologist (physician) and Advanced Practice Providers (APPs- Physician Assistants and Nurse Practitioners) who all work together to provide you with the care you need, when you need it.   You may see any of the following providers on your designated Care Team at your next follow up: Dr Jules Oar Dr Peder Bourdon Dr. Alwin Baars Dr. Arta Lark Amy Marijane Shoulders, NP Ruddy Corral, [place] [facility] [place], [place] Dennise Fitz, NP [place] Lee, NP Shawnee Dellen, NP Luster Salters, PharmD Bevely Brush, PharmD   Please be sure to bring in all your medications bottles to every appointment.    Thank you for choosing  [facility]

## 2024-04-15 NOTE — Progress Notes (Signed)
 [facility] NOTE   Primary Care: Leesa Pulling, MD Primary Cardiologist: Dr. Micael Adas HF Cardiologist: Dr. Julane Ny Reason for Visit: Heart Failure Follow-up  HPI: Charles [name] is a 58 y.o. male with a  h/o afib and ablation in 2020. Also h/o of  DM, HTN, LVH, LV dysfunction, OSA, not using CPAP recently. He underwent nuclear stress test in 1/23 which showed a small fixed defect with moderate reduction in uptake in the apical to basal inferior location and severe LV dysfunction with EF 26% with inferior infarct. Cardiac CT in 2020 showed a coronary calcium  score 3.2 with myocardial bridge in the mid LAD and mixed plaque without significant stenosis in the proximal LAD but otherwise poor images due to motion degradation artifact.    Echo 2/23 showed severe LV dysfunction with EF 20-25% with moderate RVE and moderate RV dysfunction, G3DD and mild AR.    R/LHC 3/23: minimal CAD; LAD 20% RCA 20% with apparent akinesis of the anterior wall. EF 25-30%. Of note EKG, 2/23 showed frequent PVCs.   cMRI 6/23: LVEF 37%. No LGE. RV normal Mild AI   Zio 5/23: 67 runs of NSVTl 141 runs of SVT; Frequent PACs 6.1%; 3. Frequent PVCs 9.8%   Sleep Study 8/23: AHI 16.4 Oxygen desaturations as low as 83%.   Echo 10/23: EF 55-60%, RV ok. PVCs suppressed, began amio taper.  He returns today for heart failure follow up. Overall feeling well. NYHA I. Working a physical job and able to perform tasks like mowing the lawn without problem. Denies chest pain, dyspnea, fatigue, near-syncope, orthopnea, palpitations, dizziness, and abnormal bleeding. Able to perform ADLs. Appetite okay. Weight at home trending down. Compliant with all medications.  Past Medical History:  Diagnosis Date   Actinic keratosis    Atrial fibrillation (HCC)    Basal cell carcinoma    Right arm, chest, left neck   BCC (basal cell carcinoma of skin) 02/12/2024   left forearm, excision 03/31/24   BCC (basal cell carcinoma of  skin) 02/12/2024   superficial at right upper back, imiquimod    BCC (basal cell carcinoma of skin) 02/12/2024   superficial at left mid abdomen, imiquimod    Chronic systolic dysfunction of left ventricle    Diabetes mellitus    A1c of 8% is an improvement from the previous level of 11%   Hyperlipidemia    Hypertension    LVH (left ventricular hypertrophy)    Echo 08/3012-mild LVH   Melanoma in situ (HCC) 12/15/2023   Right cheek. Mohs 01/05/24   Nonischemic cardiomyopathy (HCC)    39% - echo on 07/20/2011 -- EF of 45-50% and grade  2 diastolic dysfunction.   Obesity    Obstructive sleep apnea    noncompliant with CPAP   Peripheral vascular disease (HCC)    Squamous cell carcinoma in situ (SCCIS) 02/12/2024   left lateral neck, 5FU/calcipotriene   Current Outpatient Medications  Medication Sig Dispense Refill   APPLE CIDER VINEGAR PO Take 450-900 mg by mouth See admin instructions. Take 2 tablets (900 mg) by mouth in the morning & 1 tablet (450 mg) by mouth in the evening.     cetirizine (ZYRTEC) 10 MG tablet Take 10 mg by mouth daily.      FARXIGA 10 MG TABS tablet Take 10 mg by mouth daily.   1   furosemide  (LASIX ) 40 MG tablet TAKE 1 TABLET (40 MG TOTAL) BY MOUTH AS NEEDED. FOR WEIGHT GAIN OF 3LB IN 24 HOURS OR  5 LB IN A WEEK 90 tablet 2   glipiZIDE (GLUCOTROL) 5 MG tablet Take 5 mg by mouth 2 (two) times daily before a meal.      metFORMIN (GLUCOPHAGE-XR) 500 MG 24 hr tablet Take 1,000 mg by mouth 2 (two) times a day.      metoprolol  succinate (TOPROL -XL) 25 MG 24 hr tablet TAKE 1 TABLET (25 MG TOTAL) BY MOUTH DAILY. 90 tablet 3   MOUNJARO 15 MG/0.5ML Pen Inject 15 mg into the skin once a week. Takes on Fridays     Potassium Chloride  ER 20 MEQ TBCR TAKE 1 TABLET BY MOUTH EVERY DAY 90 tablet 3   rosuvastatin  (CRESTOR ) 20 MG tablet TAKE 1 TABLET BY MOUTH EVERY DAY 15 tablet 0   sacubitril-valsartan (ENTRESTO ) 24-26 MG TAKE 1 TABLET BY MOUTH TWICE A DAY 90 tablet 0   spironolactone   (ALDACTONE ) 25 MG tablet TAKE 1 TABLET (25 MG TOTAL) BY MOUTH DAILY. 90 tablet 3   traMADol  (ULTRAM ) 50 MG tablet Take 1 tablet (50 mg total) by mouth every 6 (six) hours as needed for up to 8 doses. 8 tablet 0   XARELTO  20 MG TABS tablet Take 20 mg by mouth daily.   6   No current facility-administered medications for this encounter.   Allergies  Allergen Reactions   Procaine Hcl Nausea And Vomiting    Per Pharmacy 09/09/2019, Okay for Lidocaine  and Bupivicaine.   Social History   Socioeconomic History   Marital status: Divorced    Spouse name: Not on file   Number of children: Not on file   Years of education: Not on file   Highest education level: Not on file  Occupational History   Not on file  Tobacco Use   Smoking status: Never   Smokeless tobacco: Never  Vaping Use   Vaping status: Never Used  Substance and Sexual Activity   Alcohol use: Yes    Alcohol/week: 1.0 standard drink of alcohol    Types: 1 Standard drinks or equivalent per week    Comment: seldom   Drug use: No   Sexual activity: Not on file  Other Topics Concern   Not on file  Social History Narrative   Lives in [place] 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,  Scientist, product/process development         Social Drivers of Corporate investment banker Strain: Not on Ship broker Insecurity: Not on file  Transportation Needs: Not on file  Physical Activity: Not on file  Stress: Not on file  Social Connections: Not on file  Intimate Partner Violence: Not on file   Family History  Problem Relation Age of Onset   Hypertension Mother    Diabetes Mother    Alcohol abuse Father    CAD Father    Coronary artery disease Sister        with multiple stents   Coronary artery disease Other        in multiple family members   Hypertension Brother    Diabetes Sister    Diabetes Brother    BP 96/66   Pulse 76   Wt 103.6 kg (228 lb 6.4 oz)   SpO2 95%   BMI 28.55 kg/m   Wt Readings from Last 3 Encounters:  04/15/24 103.6 kg (228 lb 6.4  oz)  12/23/23 107.9 kg (237 lb 12.8 oz)  12/13/22 109.8 kg (242 lb)   PHYSICAL EXAM: General: Well appearing. No distress on RA Cardiac: JVP flat. S1 and S2 present.  No murmurs or rub. Resp: Lung sounds clear and equal B/L Abdomen: Soft, non-tender, non-distended.  Extremities: Warm and dry.  Trace ankle edema.  Neuro: Alert and oriented x3. Affect pleasant. Moves all extremities without difficulty.  ASSESSMENT & PLAN: 1. Chronic systolic HF due to NICM  - Echo 2023 showed severe LV dysfunction with EF 20-25% with moderate RVE and moderate RV dysfunction, G3DD mild AR.   - R/L Cath  02/07/22 Minimal CAD LAD 20% RCA 20% with apparent akinesis of the anterior wall. EF 25-30%. Well compensated hemodynamics.  - cMRI 05/30/22: LVEF = 37%. No LGE. RV normal Mild AI. No evidence of infiltrative disease.  - Zio 5/23 -67 runs of NSVTl 141 runs of SVT,  Frequent PACs 6.1%, Frequent PVCs 9.8%. - Suspect PVC CM. Amio started 6/23 - Echo 10/23 EF improved 60-65% moderate LVH.  - NYHA I.  Volume good.  - Continue Lasix  20 mg daily, taking additional PRN 20 mg in PM if he needs it.  - Continue Entresto  24/26 mg bid - Continue Farxiga 10 mg daily - Continue spiro 25 mg daily.  - Continue Toprol  XL 25 mg daily.  - Will schedule for updated echo  2. PAF - s/p previous ablation.  - in regular rhythm on exam - On Xarelto . No bleeding.  3. PVCs - frequent PVCs noted on prior EKG 2/23 - Zio 5/23 9.8% PVCs - suspect PVC associated CM  - Off amio. Repeat Zio 2 week, if PVCs reoccurs will send to EP for AA for possible ablation.    4. OSA, moderate  - Moderate, with AHI 16.4/hour.  - Intermittently using CPAP  5. Obesity  - Body mass index is 28.55 kg/m. - Losing weight. - On Trulicity.   Vernia Good, FNP-BC 04/15/24

## 2024-04-18 ENCOUNTER — Other Ambulatory Visit: Payer: Self-pay | Admitting: Dermatology

## 2024-05-10 NOTE — Addendum Note (Signed)
 Encounter addended by: Stan Eans, RN on: 05/10/2024 11:52 AM  Actions taken: Imaging Exam ended

## 2024-05-15 ENCOUNTER — Other Ambulatory Visit: Payer: Self-pay | Admitting: Internal Medicine

## 2024-05-15 DIAGNOSIS — I480 Paroxysmal atrial fibrillation: Secondary | ICD-10-CM

## 2024-05-15 DIAGNOSIS — I10 Essential (primary) hypertension: Secondary | ICD-10-CM

## 2024-05-15 DIAGNOSIS — E7800 Pure hypercholesterolemia, unspecified: Secondary | ICD-10-CM

## 2024-05-15 DIAGNOSIS — R9439 Abnormal result of other cardiovascular function study: Secondary | ICD-10-CM

## 2024-05-15 DIAGNOSIS — I428 Other cardiomyopathies: Secondary | ICD-10-CM

## 2024-05-15 DIAGNOSIS — I2510 Atherosclerotic heart disease of native coronary artery without angina pectoris: Secondary | ICD-10-CM

## 2024-05-15 DIAGNOSIS — G4733 Obstructive sleep apnea (adult) (pediatric): Secondary | ICD-10-CM

## 2024-05-20 ENCOUNTER — Encounter: Payer: Self-pay | Admitting: Dermatology

## 2024-05-20 ENCOUNTER — Ambulatory Visit: Admitting: Dermatology

## 2024-05-20 DIAGNOSIS — D229 Melanocytic nevi, unspecified: Secondary | ICD-10-CM

## 2024-05-20 DIAGNOSIS — C44519 Basal cell carcinoma of skin of other part of trunk: Secondary | ICD-10-CM

## 2024-05-20 DIAGNOSIS — L570 Actinic keratosis: Secondary | ICD-10-CM | POA: Diagnosis not present

## 2024-05-20 DIAGNOSIS — L821 Other seborrheic keratosis: Secondary | ICD-10-CM

## 2024-05-20 DIAGNOSIS — D492 Neoplasm of unspecified behavior of bone, soft tissue, and skin: Secondary | ICD-10-CM

## 2024-05-20 DIAGNOSIS — Z86006 Personal history of melanoma in-situ: Secondary | ICD-10-CM

## 2024-05-20 DIAGNOSIS — Z1283 Encounter for screening for malignant neoplasm of skin: Secondary | ICD-10-CM | POA: Diagnosis not present

## 2024-05-20 DIAGNOSIS — L814 Other melanin hyperpigmentation: Secondary | ICD-10-CM | POA: Diagnosis not present

## 2024-05-20 DIAGNOSIS — W908XXA Exposure to other nonionizing radiation, initial encounter: Secondary | ICD-10-CM | POA: Diagnosis not present

## 2024-05-20 DIAGNOSIS — L578 Other skin changes due to chronic exposure to nonionizing radiation: Secondary | ICD-10-CM

## 2024-05-20 DIAGNOSIS — D1801 Hemangioma of skin and subcutaneous tissue: Secondary | ICD-10-CM

## 2024-05-20 DIAGNOSIS — Z7189 Other specified counseling: Secondary | ICD-10-CM

## 2024-05-20 DIAGNOSIS — C4491 Basal cell carcinoma of skin, unspecified: Secondary | ICD-10-CM

## 2024-05-20 DIAGNOSIS — Q833 Accessory nipple: Secondary | ICD-10-CM

## 2024-05-20 DIAGNOSIS — I788 Other diseases of capillaries: Secondary | ICD-10-CM

## 2024-05-20 HISTORY — DX: Basal cell carcinoma of skin, unspecified: C44.91

## 2024-05-20 NOTE — Patient Instructions (Addendum)
 For Aks on face and left ear - Start 5-fluorouracil  / Calcipotriene cream twice a day until area gets red and irritated days to affected areas including Left helical insertion, Left antehelix, Right temple and nose.    Wound Care Instructions  Cleanse wound gently with soap and water once a day then pat dry with clean gauze. Apply a thin coat of Petrolatum (petroleum jelly, Vaseline) over the wound (unless you have an allergy to this). We recommend that you use a new, sterile tube of Vaseline. Do not pick or remove scabs. Do not remove the yellow or white healing tissue from the base of the wound.  Cover the wound with fresh, clean, nonstick gauze and secure with paper tape. You may use Band-Aids in place of gauze and tape if the wound is small enough, but would recommend trimming much of the tape off as there is often too much. Sometimes Band-Aids can irritate the skin.  You should call the office for your biopsy report after 1 week if you have not already been contacted.  If you experience any problems, such as abnormal amounts of bleeding, swelling, significant bruising, significant pain, or evidence of infection, please call the office immediately.  FOR ADULT SURGERY PATIENTS: If you need something for pain relief you may take 1 extra strength Tylenol  (acetaminophen ) AND 2 Ibuprofen (200mg  each) together every 4 hours as needed for pain. (do not take these if you are allergic to them or if you have a reason you should not take them.) Typically, you may only need pain medication for 1 to 3 days.     Cryotherapy Aftercare  Wash gently with soap and water everyday.   Apply Vaseline and Band-Aid daily until healed.   Due to recent changes in healthcare laws, you may see results of your pathology and/or laboratory studies on MyChart before the doctors have had a chance to review them. We understand that in some cases there may be results that are confusing or concerning to you. Please  understand that not all results are received at the same time and often the doctors may need to interpret multiple results in order to provide you with the best plan of care or course of treatment. Therefore, we ask that you please give us  2 business days to thoroughly review all your results before contacting the office for clarification. Should we see a critical lab result, you will be contacted sooner.   If You Need Anything After Your Visit  If you have any questions or concerns for your doctor, please call our main line at [phone] and press option 4 to reach your doctor's medical assistant. If no one answers, please leave a voicemail as directed and we will return your call as soon as possible. Messages left after 4 pm will be answered the following business day.   You may also send us  a message via MyChart. We typically respond to MyChart messages within 1-2 business days.  For prescription refills, please ask your pharmacy to contact our office. Our fax number is [phone].  If you have an urgent issue when the clinic is closed that cannot wait until the next business day, you can page your doctor at the number below.    Please note that while we do our best to be available for urgent issues outside of office hours, we are not available 24/7.   If you have an urgent issue and are unable to reach us , you may choose to seek medical care  at your doctor's office, retail clinic, urgent care center, or emergency room.  If you have a medical emergency, please immediately call 911 or go to the emergency department.  Pager Numbers  - Dr. Bary Likes: [phone]  - Dr. Annette Barters: [phone]  - Dr. Felipe Horton: [phone]   In the event of inclement weather, please call our main line at [phone] for an update on the status of any delays or closures.  Dermatology Medication Tips: Please keep the boxes that topical medications come in in order to help keep track of the instructions  about where and how to use these. Pharmacies typically print the medication instructions only on the boxes and not directly on the medication tubes.   If your medication is too expensive, please contact our office at [phone] option 4 or send us  a message through MyChart.   We are unable to tell what your co-pay for medications will be in advance as this is different depending on your insurance coverage. However, we may be able to find a substitute medication at lower cost or fill out paperwork to get insurance to cover a needed medication.   If a prior authorization is required to get your medication covered by your insurance company, please allow us  1-2 business days to complete this process.  Drug prices often vary depending on where the prescription is filled and some pharmacies may offer cheaper prices.  The website www.goodrx.com contains coupons for medications through different pharmacies. The prices here do not account for what the cost may be with help from insurance (it may be cheaper with your insurance), but the website can give you the price if you did not use any insurance.  - You can print the associated coupon and take it with your prescription to the pharmacy.  - You may also stop by our office during regular business hours and pick up a GoodRx coupon card.  - If you need your prescription sent electronically to a different pharmacy, notify our office through [facility] or by phone at [phone] option 4.     Si Usted Necesita Algo Despus de Su Visita  Tambin puede enviarnos un mensaje a travs de Clinical cytogeneticist. Por lo general respondemos a los mensajes de MyChart en el transcurso de 1 a 2 das hbiles.  Para renovar recetas, por favor pida a su farmacia que se ponga en contacto con nuestra oficina. Franz Jacks de fax es [place] [phone].  Si tiene un asunto urgente cuando la clnica est cerrada y que no puede esperar hasta el siguiente da hbil, puede  llamar/localizar a su doctor(a) al nmero que aparece a continuacin.   Por favor, tenga en cuenta que aunque hacemos todo lo posible para estar disponibles para asuntos urgentes fuera del horario de [place], no estamos disponibles las 24 horas del da, los 7 [address]  [address] de la [place].   Si tiene un problema urgente y no puede comunicarse con nosotros, puede optar por buscar atencin mdica  en el consultorio de su doctor(a), en una clnica privada, en un centro de atencin urgente o en una sala de emergencias.  Si tiene Engineer, drilling, por favor llame inmediatamente al 911 o vaya a la sala de emergencias.  Nmeros de bper  - Dr. Bary Likes: [phone]  - Dra. Annette Barters: [phone]  - Dr. Felipe Horton: [phone]   En caso de inclemencias del tiempo, por favor llame a Lajuan Pila principal al [phone] para una actualizacin sobre el [place] de cualquier retraso o cierre.  Consejos para la  medicacin en dermatologa: Por favor, guarde las cajas en las que vienen los medicamentos de uso tpico para ayudarle a seguir las instrucciones sobre dnde y cmo usarlos. Las farmacias generalmente imprimen las instrucciones del medicamento slo en las cajas y no directamente en los tubos del [place].   Si su medicamento es muy caro, por favor, pngase en contacto con Bettyjane Brunet llamando al [phone] y presione la opcin 4 o envenos un mensaje a travs de Clinical cytogeneticist.   No podemos decirle cul ser su copago por los medicamentos por adelantado ya que esto es diferente dependiendo de la cobertura de su seguro. Sin embargo, es posible que podamos encontrar un medicamento sustituto a Audiological scientist un formulario para que el seguro cubra el medicamento que se considera necesario.   Si se requiere una autorizacin previa para que su compaa de seguros [place] su medicamento, por favor permtanos de 1 a 2 das hbiles para completar este proceso.  Los precios de los medicamentos varan con  frecuencia dependiendo del Environmental consultant de dnde se surte la receta y alguna farmacias pueden ofrecer precios ms baratos.  El sitio web www.goodrx.com tiene cupones para medicamentos de Health and safety inspector. Los precios aqu no tienen en cuenta lo que podra costar con la ayuda del seguro (puede ser ms barato con su seguro), pero el sitio web puede darle el precio si no utiliz Tourist information centre manager.  - Puede imprimir el cupn correspondiente y llevarlo con su receta a la farmacia.  - Tambin puede pasar por nuestra oficina durante el horario de atencin regular y Education officer, museum una tarjeta de cupones de GoodRx.  - Si necesita que su receta se enve electrnicamente a una farmacia diferente, informe a nuestra oficina a travs de MyChart de [place] o por telfono llamando al [phone] y presione la opcin 4.

## 2024-05-20 NOTE — Progress Notes (Signed)
 Follow-Up Visit   Subjective  Charles [name] is a 58 y.o. male who presents for the following: Skin Cancer Screening and Full Body Skin Exam hx of Melanoma IS, BCC, SCC IS, Aks, pt used Imiquimod  to BCCs R upper back and L mid abdomen, pt used 5FU/Calcipotriene to [facility] IS L lat neck  The patient presents for Total-Body Skin Exam (TBSE) for skin cancer screening and mole check. The patient has spots, moles and lesions to be evaluated, some may be new or changing and the patient may have concern these could be cancer.    The following portions of the chart were reviewed this encounter and updated as appropriate: medications, allergies, medical history  Review of Systems:  No other skin or systemic complaints except as noted in HPI or Assessment and Plan.  Objective  Well appearing patient in no apparent distress; mood and affect are within normal limits.  A full examination was performed including scalp, head, eyes, ears, nose, lips, neck, chest, axillae, abdomen, back, buttocks, bilateral upper extremities, bilateral lower extremities, hands, feet, fingers, toes, fingernails, and toenails. All findings within normal limits unless otherwise noted below.   Relevant physical exam findings are noted in the Assessment and Plan.  L infraorbital x 1 Pink scaly macules Central upper back 7.46mm pink scaly pap   Assessment & Plan   SKIN CANCER SCREENING PERFORMED TODAY.  ACTINIC DAMAGE WITH PRECANCEROUS ACTINIC KERATOSES Counseling for Topical Chemotherapy Management: Patient exhibits: - Severe, confluent actinic changes with pre-cancerous actinic keratoses that is secondary to cumulative UV radiation exposure over time - Condition that is severe; chronic, not at goal. - diffuse scaly erythematous macules and papules with underlying dyspigmentation - Discussed Prescription Field Treatment topical Chemotherapy for Severe, Chronic Confluent Actinic Changes with Pre-Cancerous Actinic  Keratoses Field treatment involves treatment of an entire area of skin that has confluent Actinic Changes (Sun/ Ultraviolet light damage) and PreCancerous Actinic Keratoses by method of PhotoDynamic Therapy (PDT) and/or prescription Topical Chemotherapy agents such as 5-fluorouracil , 5-fluorouracil /calcipotriene, and/or imiquimod .  The purpose is to decrease the number of clinically evident and subclinical PreCancerous lesions to prevent progression to development of skin cancer by chemically destroying early precancer changes that may or may not be visible.  It has been shown to reduce the risk of developing skin cancer in the treated area. As a result of treatment, redness, scaling, crusting, and open sores may occur during treatment course. One or more than one of these methods may be used and may have to be used several times to control, suppress and eliminate the PreCancerous changes. Discussed treatment course, expected reaction, and possible side effects. - Recommend daily broad spectrum sunscreen SPF 30+ to sun-exposed areas, reapply every 2 hours as needed.  - Staying in the shade or wearing long sleeves, sun glasses (UVA+UVB protection) and wide brim hats (4-inch brim around the entire circumference of the hat) are also recommended. - Call for new or changing lesions.  - Start 5-fluorouracil  / Calcipotriene cream twice a day until area gets red and irritated days to affected areas including L helical insertion, L antehelix, R temple and nose.  Reviewed course of treatment and expected reaction.  Patient advised to expect inflammation and crusting and advised that erosions are possible.  Patient advised to be diligent with sun protection during and after treatment. Handout with details of how to apply medication and what to expect provided. Counseled to keep medication out of reach of children and pets.  LENTIGINES, SEBORRHEIC KERATOSES,  HEMANGIOMAS accessory nipple- Benign normal skin lesions -  Benign-appearing - Call for any changes  MELANOCYTIC NEVI - Tan-brown and/or pink-flesh-colored symmetric macules and papules - Benign appearing on exam today - Observation - Call clinic for new or changing moles - Recommend daily use of broad spectrum spf 30+ sunscreen to sun-exposed areas.   HISTORY OF MELANOMA IN SITU - No evidence of recurrence today - Recommend regular full body skin exams - Recommend daily broad spectrum sunscreen SPF 30+ to sun-exposed areas, reapply every 2 hours as needed.  - Call if any new or changing lesions are noted between office visits  - R cheek, mohs 01/05/24  HISTORY OF BASAL CELL CARCINOMA OF THE SKIN - No evidence of recurrence today - Recommend regular full body skin exams - Recommend daily broad spectrum sunscreen SPF 30+ to sun-exposed areas, reapply every 2 hours as needed.  - Call if any new or changing lesions are noted between office visits  - R arm, chest, L neck, L forearm, R upper back, L mid abdomen (R upper back, L mid abdomen clear with recent Imiquimod  treatment)  HISTORY OF SQUAMOUS CELL CARCINOMA IN SITU OF THE SKIN - No evidence of recurrence today - Recommend regular full body skin exams - Recommend daily broad spectrum sunscreen SPF 30+ to sun-exposed areas, reapply every 2 hours as needed.  - Call if any new or changing lesions are noted between office visits  - L lat neck, clear with recent 5FU/Calcipotriene  CAPILLARITIS Lower legs Exam: innumerable pinpoint red to golden macules on lower legs and feet  Treatment Plan: Benign, observe Recommend compression socks qd  AK (ACTINIC KERATOSIS) L infraorbital x 1 Actinic keratoses are precancerous spots that appear secondary to cumulative UV radiation exposure/sun exposure over time. They are chronic with expected duration over 1 year. A portion of actinic keratoses will progress to squamous cell carcinoma of the skin. It is not possible to reliably predict which spots  will progress to skin cancer and so treatment is recommended to prevent development of skin cancer.  Recommend daily broad spectrum sunscreen SPF 30+ to sun-exposed areas, reapply every 2 hours as needed.  Recommend staying in the shade or wearing long sleeves, sun glasses (UVA+UVB protection) and wide brim hats (4-inch brim around the entire circumference of the hat). Call for new or changing lesions. Destruction of lesion - L infraorbital x 1 Complexity: simple   Destruction method: cryotherapy   Informed consent: discussed and consent obtained   Timeout:  patient name, date of birth, surgical site, and procedure verified Lesion destroyed using liquid nitrogen: Yes   Region frozen until ice ball extended beyond lesion: Yes   Cryo cycles: 1 or 2. Outcome: patient tolerated procedure well with no complications   Post-procedure details: wound care instructions given   NEOPLASM OF SKIN Central upper back Skin / nail biopsy Type of biopsy: tangential   Informed consent: discussed and consent obtained   Timeout: patient name, date of birth, surgical site, and procedure verified   Procedure prep:  Patient was prepped and draped in usual sterile fashion Prep type:  Isopropyl alcohol Anesthesia: the lesion was anesthetized in a standard fashion   Anesthetic:  1% lidocaine  w/ epinephrine 1-100,000 buffered w/ 8.4% NaHCO3 Instrument used: DermaBlade   Hemostasis achieved with: pressure and aluminum chloride   Outcome: patient tolerated procedure well   Post-procedure details: sterile dressing applied and wound care instructions given   Dressing type: bandage and bacitracin   Specimen 1 -  Surgical pathology Differential Diagnosis: R/O BCC  Check Margins: No 7.13mm pink scaly pap MULTIPLE BENIGN NEVI   LENTIGINES   ACTINIC ELASTOSIS   SEBORRHEIC KERATOSES   CHERRY ANGIOMA   ACCESSORY NIPPLE   CAPILLARITIS   Return in about 3 months (around 08/20/2024) for TBSE, Hx of  Melanoma IS, Hx of BCC, Hx of SCC IS, Hx of AKs.  I, Rollie Clipper, RMA, am acting as scribe for Harris Liming, MD .   Documentation: I have reviewed the above documentation for accuracy and completeness, and I agree with the above.  Harris Liming, MD

## 2024-05-26 ENCOUNTER — Ambulatory Visit: Payer: Self-pay | Admitting: Dermatology

## 2024-05-26 ENCOUNTER — Encounter: Payer: Self-pay | Admitting: Dermatology

## 2024-05-26 LAB — SURGICAL PATHOLOGY

## 2024-05-26 NOTE — Telephone Encounter (Signed)
-----   Message from [place] sent at 05/26/2024  3:56 PM EDT ----- Diagnosis: central upper back :       SUPERFICIAL BASAL CELL CARCINOMA   Please call to share diagnosis and discuss treatment options.  Explanation: your biopsy shows a basal cell skin cancer limited to the top layer of skin. This is the most common kind of skin cancer and is caused by damage from sun exposure. Basal cell skin  cancers almost never spread beyond the skin, so they are not dangerous to your overall health. However, they will continue to grow, can bleed, cause nonhealing wounds, and disrupt nearby structures  unless fully treated.  Treatment option 1: you return for a brief appointment where I perform electrodesiccation and curettage [facility]). This involves three rounds of scraping and burning to destroy the skin cancer. It has  about an 85% cure rate and leaves a round wound slightly larger than the skin cancer and leaves a round white scar. No additional pathology is done. If the skin cancer comes back, we would need to do  a surgery to remove it.   Treatment option 2: imiquimod  is a cream that helps your immune system clear the skin cancer. You will apply the cream on weekdays for 6 weeks. It has an 88% cure rate. If redness and irritation  develop, take 3 days off before restarting. If an open sore develops, stop the cream and send us  a message on MyChart or call us .  If the cream does not clear the cancer, surgery will be required.  ##please make 2 month follow up for recheck  For option 2: Free text instructions and copy paste: apply cream to your skin cancer once daily on 5 days per week, prior to normal sleeping hours, for 6 weeks. Leave it on your skin for ~8 hours,  then remove with mild soap and water. Apply enough cream to cover the treatment area and a quarter inch of skin surrounding the tumor. If redness and irritation develop, take 3 days off then  restart. Prescribe 12 packets with 2  refills.  Treatment option 3: you return for an hour long appointment where we perform a skin surgery. We numb the site of the skin cancer and a safety margin of normal skin around it. We remove the full  thickness of skin and close the wound with two layers of stitches. The sample is sent to the lab to check that the skin cancer was fully removed. Return one week later to have wound checked and  surface stitches removed. Surgical wound leaves a line scar. Approximately 95% cure rate ----- Message ----- From: Interface, Lab In Three Zero One Sent: 05/26/2024   3:17 PM EDT To: Harris Liming, MD

## 2024-05-27 NOTE — Telephone Encounter (Signed)
 Patient returned call and left msg on our nurse line. Called patient back this morning and left another message. aw

## 2024-05-27 NOTE — Telephone Encounter (Signed)
 Patient has Imiquimod  cream at home. He has chosen to treat with option 2. Two month follow up scheduled. aw

## 2024-06-23 ENCOUNTER — Other Ambulatory Visit: Payer: Self-pay | Admitting: Internal Medicine

## 2024-06-23 DIAGNOSIS — I519 Heart disease, unspecified: Secondary | ICD-10-CM

## 2024-06-23 DIAGNOSIS — R9439 Abnormal result of other cardiovascular function study: Secondary | ICD-10-CM

## 2024-06-23 DIAGNOSIS — G4733 Obstructive sleep apnea (adult) (pediatric): Secondary | ICD-10-CM

## 2024-06-23 DIAGNOSIS — I10 Essential (primary) hypertension: Secondary | ICD-10-CM

## 2024-06-23 DIAGNOSIS — E7800 Pure hypercholesterolemia, unspecified: Secondary | ICD-10-CM

## 2024-06-23 DIAGNOSIS — I480 Paroxysmal atrial fibrillation: Secondary | ICD-10-CM

## 2024-06-23 DIAGNOSIS — I2510 Atherosclerotic heart disease of native coronary artery without angina pectoris: Secondary | ICD-10-CM

## 2024-06-23 DIAGNOSIS — I428 Other cardiomyopathies: Secondary | ICD-10-CM

## 2024-07-29 ENCOUNTER — Ambulatory Visit: Admitting: Dermatology

## 2024-08-02 ENCOUNTER — Other Ambulatory Visit (HOSPITAL_COMMUNITY): Payer: Self-pay | Admitting: Internal Medicine

## 2024-08-02 DIAGNOSIS — I428 Other cardiomyopathies: Secondary | ICD-10-CM

## 2024-08-02 DIAGNOSIS — I519 Heart disease, unspecified: Secondary | ICD-10-CM

## 2024-08-24 ENCOUNTER — Ambulatory Visit: Admitting: Dermatology

## 2024-08-26 ENCOUNTER — Ambulatory Visit: Admitting: Dermatology

## 2024-09-14 ENCOUNTER — Ambulatory Visit: Admitting: Dermatology

## 2024-09-16 ENCOUNTER — Encounter: Payer: Self-pay | Admitting: Dermatology

## 2024-09-16 ENCOUNTER — Ambulatory Visit: Admitting: Dermatology

## 2024-09-16 DIAGNOSIS — W908XXA Exposure to other nonionizing radiation, initial encounter: Secondary | ICD-10-CM

## 2024-09-16 DIAGNOSIS — L570 Actinic keratosis: Secondary | ICD-10-CM | POA: Diagnosis not present

## 2024-09-16 DIAGNOSIS — L578 Other skin changes due to chronic exposure to nonionizing radiation: Secondary | ICD-10-CM

## 2024-09-16 DIAGNOSIS — Z86006 Personal history of melanoma in-situ: Secondary | ICD-10-CM

## 2024-09-16 DIAGNOSIS — Z1283 Encounter for screening for malignant neoplasm of skin: Secondary | ICD-10-CM | POA: Diagnosis not present

## 2024-09-16 DIAGNOSIS — L821 Other seborrheic keratosis: Secondary | ICD-10-CM

## 2024-09-16 DIAGNOSIS — Z86007 Personal history of in-situ neoplasm of skin: Secondary | ICD-10-CM

## 2024-09-16 DIAGNOSIS — D1801 Hemangioma of skin and subcutaneous tissue: Secondary | ICD-10-CM

## 2024-09-16 DIAGNOSIS — Z85828 Personal history of other malignant neoplasm of skin: Secondary | ICD-10-CM

## 2024-09-16 DIAGNOSIS — L814 Other melanin hyperpigmentation: Secondary | ICD-10-CM

## 2024-09-16 DIAGNOSIS — D229 Melanocytic nevi, unspecified: Secondary | ICD-10-CM

## 2024-09-16 NOTE — Progress Notes (Signed)
 Follow-Up Visit   Subjective  Charles [name] is a 58 y.o. male who presents for the following: Skin Cancer Screening and Full Body Skin Exam hx of Melanoma IS, BCCs,SCC IS, Aks,  AK f/u  5FU/Calcipotriene to L helical insertion, L antehelix, R temple, with peeling reaction, did not treat nose Recheck BCC central upper back used Imiquimod  cr 5d/wk x 6 wks  The patient presents for Total-Body Skin Exam (TBSE) for skin cancer screening and mole check. The patient has spots, moles and lesions to be evaluated, some may be new or changing and the patient may have concern these could be cancer.    The following portions of the chart were reviewed this encounter and updated as appropriate: medications, allergies, medical history  Review of Systems:  No other skin or systemic complaints except as noted in HPI or Assessment and Plan.  Objective  Well appearing patient in no apparent distress; mood and affect are within normal limits.  A full examination was performed including scalp, head, eyes, ears, nose, lips, neck, chest, axillae, abdomen, back, buttocks, bilateral upper extremities, bilateral lower extremities, hands, feet, fingers, toes, fingernails, and toenails. All findings within normal limits unless otherwise noted below.   Relevant physical exam findings are noted in the Assessment and Plan.    Assessment & Plan   SKIN CANCER SCREENING PERFORMED TODAY.  ACTINIC DAMAGE WITH PRECANCEROUS ACTINIC KERATOSES Counseling for Topical Chemotherapy Management: Patient exhibits: - Severe, confluent actinic changes with pre-cancerous actinic keratoses that is secondary to cumulative UV radiation exposure over time - Condition that is severe; chronic, not at goal. - diffuse scaly erythematous macules and papules with underlying dyspigmentation - Discussed Prescription Field Treatment topical Chemotherapy for Severe, Chronic Confluent Actinic Changes with Pre-Cancerous Actinic  Keratoses Field treatment involves treatment of an entire area of skin that has confluent Actinic Changes (Sun/ Ultraviolet light damage) and PreCancerous Actinic Keratoses by method of PhotoDynamic Therapy (PDT) and/or prescription Topical Chemotherapy agents such as 5-fluorouracil , 5-fluorouracil /calcipotriene, and/or imiquimod .  The purpose is to decrease the number of clinically evident and subclinical PreCancerous lesions to prevent progression to development of skin cancer by chemically destroying early precancer changes that may or may not be visible.  It has been shown to reduce the risk of developing skin cancer in the treated area. As a result of treatment, redness, scaling, crusting, and open sores may occur during treatment course. One or more than one of these methods may be used and may have to be used several times to control, suppress and eliminate the PreCancerous changes. Discussed treatment course, expected reaction, and possible side effects. - Recommend daily broad spectrum sunscreen SPF 30+ to sun-exposed areas, reapply every 2 hours as needed.  - Staying in the shade or wearing long sleeves, sun glasses (UVA+UVB protection) and wide brim hats (4-inch brim around the entire circumference of the hat) are also recommended. - Call for new or changing lesions.  - Start 5-fluorouracil  cream twice a day until area gets red and irritated to affected areas including L antehelix, nose, preauricular cheeks, bil temples, vertex scalp.  Reviewed course of treatment and expected reaction.  Patient advised to expect inflammation and crusting and advised that erosions are possible.  Patient advised to be diligent with sun protection during and after treatment. Handout with details of how to apply medication and what to expect provided. Counseled to keep medication out of reach of children and pets.    LENTIGINES, SEBORRHEIC KERATOSES, HEMANGIOMAS - Benign normal skin  lesions - Benign-appearing -  Call for any changes  MELANOCYTIC NEVI - Tan-brown and/or pink-flesh-colored symmetric macules and papules - Benign appearing on exam today - Observation - Call clinic for new or changing moles - Recommend daily use of broad spectrum spf 30+ sunscreen to sun-exposed areas.   HISTORY OF MELANOMA IN SITU - No evidence of recurrence today - Recommend regular full body skin exams - Recommend daily broad spectrum sunscreen SPF 30+ to sun-exposed areas, reapply every 2 hours as needed.  - Call if any new or changing lesions are noted between office visits  - R cheek mohs 01/05/2024  HISTORY OF BASAL CELL CARCINOMA OF THE SKIN - No evidence of recurrence today - Recommend regular full body skin exams - Recommend daily broad spectrum sunscreen SPF 30+ to sun-exposed areas, reapply every 2 hours as needed.  - Call if any new or changing lesions are noted between office visits  -R arm, chest, L neck, L forearm, R upper back, L mid abdomen, central upper back clear today  HISTORY OF SQUAMOUS CELL CARCINOMA IN SITU OF THE SKIN - No evidence of recurrence today - Recommend regular full body skin exams - Recommend daily broad spectrum sunscreen SPF 30+ to sun-exposed areas, reapply every 2 hours as needed.  - Call if any new or changing lesions are noted between office visits  -L lat neck    ACTINIC KERATOSES   ACTINIC ELASTOSIS   LENTIGINES   SEBORRHEIC KERATOSES   CHERRY ANGIOMA   MULTIPLE BENIGN NEVI   Return in about 3 months (around 12/17/2024) for TBSE Hx of Melanoma IS, BCCs,SCC IS, AKs.  I, Sonya Hupman, RMA, am acting as scribe for Boneta Sharps, MD .   Documentation: I have reviewed the above documentation for accuracy and completeness, and I agree with the above.  Boneta Sharps, MD

## 2024-09-16 NOTE — Patient Instructions (Addendum)
 - Start 5-fluorouracil  cream twice a day  until area gets red and irritated to affected areas including L antehelix, nose, preauricular cheeks, bil temples and vertex scalp.    Due to recent changes in healthcare laws, you may see results of your pathology and/or laboratory studies on MyChart before the doctors have had a chance to review them. We understand that in some cases there may be results that are confusing or concerning to you. Please understand that not all results are received at the same time and often the doctors may need to interpret multiple results in order to provide you with the best plan of care or course of treatment. Therefore, we ask that you please give us  2 business days to thoroughly review all your results before contacting the office for clarification. Should we see a critical lab result, you will be contacted sooner.   If You Need Anything After Your Visit  If you have any questions or concerns for your doctor, please call our main line at [phone] and press option 4 to reach your doctor's medical assistant. If no one answers, please leave a voicemail as directed and we will return your call as soon as possible. Messages left after 4 pm will be answered the following business day.   You may also send us  a message via MyChart. We typically respond to MyChart messages within 1-2 business days.  For prescription refills, please ask your pharmacy to contact our office. Our fax number is [phone].  If you have an urgent issue when the clinic is closed that cannot wait until the next business day, you can page your doctor at the number below.    Please note that while we do our best to be available for urgent issues outside of office hours, we are not available 24/7.   If you have an urgent issue and are unable to reach us , you may choose to seek medical care at your doctor's office, retail clinic, urgent care center, or emergency room.  If you have a medical  emergency, please immediately call 911 or go to the emergency department.  Pager Numbers  - Dr. Hester: [phone]  - Dr. Jackquline: [phone]  - Dr. Claudene: [phone]   - Dr. Raymund: [phone]  In the event of inclement weather, please call our main line at [phone] for an update on the status of any delays or closures.  Dermatology Medication Tips: Please keep the boxes that topical medications come in in order to help keep track of the instructions about where and how to use these. Pharmacies typically print the medication instructions only on the boxes and not directly on the medication tubes.   If your medication is too expensive, please contact our office at [phone] option 4 or send us  a message through MyChart.   We are unable to tell what your co-pay for medications will be in advance as this is different depending on your insurance coverage. However, we may be able to find a substitute medication at lower cost or fill out paperwork to get insurance to cover a needed medication.   If a prior authorization is required to get your medication covered by your insurance company, please allow us  1-2 business days to complete this process.  Drug prices often vary depending on where the prescription is filled and some pharmacies may offer cheaper prices.  The website www.goodrx.com contains coupons for medications through different pharmacies. The prices here do not account for what the cost may be with  help from insurance (it may be cheaper with your insurance), but the website can give you the price if you did not use any insurance.  - You can print the associated coupon and take it with your prescription to the pharmacy.  - You may also stop by our office during regular business hours and pick up a GoodRx coupon card.  - If you need your prescription sent electronically to a different pharmacy, notify our office through [facility] or by phone at [phone]  option 4.     Si Usted Necesita Algo Despus de Su Visita  Tambin puede enviarnos un mensaje a travs de Clinical cytogeneticist. Por lo general respondemos a los mensajes de MyChart en el transcurso de 1 a 2 das hbiles.  Para renovar recetas, por favor pida a su farmacia que se ponga en contacto con nuestra oficina. Randi lakes de fax es [place] [phone].  Si tiene un asunto urgente cuando la clnica est cerrada y que no puede esperar hasta el siguiente da hbil, puede llamar/localizar a su doctor(a) al nmero que aparece a continuacin.   Por favor, tenga en cuenta que aunque hacemos todo lo posible para estar disponibles para asuntos urgentes fuera del horario de [place], no estamos disponibles las 24 horas del da, los 7 [address]  [address] de la [place].   Si tiene un problema urgente y no puede comunicarse con nosotros, puede optar por buscar atencin mdica  en el consultorio de su doctor(a), en una clnica privada, en un centro de atencin urgente o en una sala de emergencias.  Si tiene Engineer, drilling, por favor llame inmediatamente al 911 o vaya a la sala de emergencias.  Nmeros de bper  - Dr. Hester: [phone]  - Dra. Jackquline: [phone]  - Dr. Claudene: [phone]  - Dra. Kitts: [phone]  En caso de inclemencias del [place], por favor llame a nuestra lnea principal al [phone] para una actualizacin sobre el estado de cualquier retraso o cierre.  Consejos para la medicacin en dermatologa: Por favor, guarde las cajas en las que vienen los medicamentos de uso tpico para ayudarle a seguir las instrucciones sobre dnde y cmo usarlos. Las farmacias generalmente imprimen las instrucciones del medicamento slo en las cajas y no directamente en los tubos del [place].   Si su medicamento es muy caro, por favor, pngase en contacto con landry rieger llamando al [phone] y presione la opcin 4 o envenos un mensaje a travs de Clinical cytogeneticist.   No podemos decirle cul ser su  copago por los medicamentos por adelantado ya que esto es diferente dependiendo de la cobertura de su seguro. Sin embargo, es posible que podamos encontrar un medicamento sustituto a Audiological scientist un formulario para que el seguro cubra el medicamento que se considera necesario.   Si se requiere una autorizacin previa para que su compaa de seguros malta su medicamento, por favor permtanos de 1 a 2 das hbiles para completar este proceso.  Los precios de los medicamentos varan con frecuencia dependiendo del Environmental consultant de dnde se surte la receta y alguna farmacias pueden ofrecer precios ms baratos.  El sitio web www.goodrx.com tiene cupones para medicamentos de Health and safety inspector. Los precios aqu no tienen en cuenta lo que podra costar con la ayuda del seguro (puede ser ms barato con su seguro), pero el sitio web puede darle el precio si no utiliz Tourist information centre manager.  - Puede imprimir el cupn correspondiente y llevarlo con su receta a la farmacia.  - Tambin puede pasar  por nuestra oficina durante el horario de atencin regular y Education officer, museum una tarjeta de cupones de GoodRx.  - Si necesita que su receta se enve electrnicamente a una farmacia diferente, informe a nuestra oficina a travs de MyChart de [place] o por telfono llamando al [phone] y presione la opcin 4.

## 2024-10-07 ENCOUNTER — Other Ambulatory Visit (HOSPITAL_COMMUNITY): Payer: Self-pay | Admitting: Internal Medicine

## 2024-10-07 ENCOUNTER — Other Ambulatory Visit (HOSPITAL_COMMUNITY): Payer: Self-pay

## 2024-10-07 MED ORDER — FUROSEMIDE 20 MG PO TABS: 20.0000 mg | ORAL_TABLET | Freq: Every day | ORAL | 3 refills | Status: AC

## 2024-10-30 ENCOUNTER — Ambulatory Visit (HOSPITAL_COMMUNITY): Payer: Self-pay | Admitting: Internal Medicine

## 2024-11-02 ENCOUNTER — Telehealth (HOSPITAL_COMMUNITY): Payer: Self-pay | Admitting: *Deleted

## 2024-11-02 NOTE — Telephone Encounter (Signed)
 Called patient per Dr. Bensimhon with following Zio results:  Needs f/u appt to discuss PVCs   F/U scheduled in [facility] 11/29/24. Pt verbalized understanding and agreement to same.

## 2024-11-26 ENCOUNTER — Ambulatory Visit (HOSPITAL_COMMUNITY)

## 2024-11-26 ENCOUNTER — Telehealth (HOSPITAL_COMMUNITY): Payer: Self-pay

## 2024-11-26 NOTE — Telephone Encounter (Signed)
 Called to confirm/remind patient of their appointment at the [facility] on 11/29/24.   Appointment:   [x] Confirmed  [] Left mess   [] No answer/No voice mail  [] VM Full/unable to leave message  [] Phone not in service  Patient reminded to bring all medications and/or complete list.  Confirmed patient has transportation. Gave directions, instructed to utilize valet parking.

## 2024-11-28 NOTE — Progress Notes (Signed)
 "  ADVANCED HF CLINIC NOTE   Primary Care: Toribio Jerel MATSU, MD Primary Cardiologist: Dr. Shlomo HF Cardiologist: Dr. Cherrie Reason for Visit: Heart Failure Follow-up  HPI: Charles [name] is a 58 y.o. male with a  h/o afib and ablation in 2020. Also h/o of  DM, HTN, LVH, LV dysfunction, OSA, not using CPAP recently. He underwent nuclear stress test in 1/23 which showed a small fixed defect with moderate reduction in uptake in the apical to basal inferior location and severe LV dysfunction with EF 26% with inferior infarct. Cardiac CT in 2020 showed a coronary calcium  score 3.2 with myocardial bridge in the mid LAD and mixed plaque without significant stenosis in the proximal LAD but otherwise poor images due to motion degradation artifact.    Echo 2/23 showed severe LV dysfunction with EF 20-25% with moderate RVE and moderate RV dysfunction, G3DD and mild AR.    R/LHC 3/23: minimal CAD; LAD 20% RCA 20% with apparent akinesis of the anterior wall. EF 25-30%. Of note EKG, 2/23 showed frequent PVCs.   cMRI 6/23: LVEF 37%. No LGE. RV normal Mild AI   Zio 5/23: 67 runs of NSVTl 141 runs of SVT; Frequent PACs 6.1%; 3. Frequent PVCs 9.8%   Sleep Study 8/23: AHI 16.4 Oxygen desaturations as low as 83%.   Echo 10/23: EF 55-60%, RV ok. PVCs suppressed, began amio taper.  Zio- 06/2024 - Frequent PVCs 18.8% with 3 morphologies. 40 NSVT .   Today he returns for HF follow up.Overall feeling fine. Denies SOB/PND/Orthopnea. Able to walk up steps without difficulty. Denies palpitations. Staying very active. Appetite ok. No fever or chills. Weight at home stable. Taking all medications. Works full time. No limitations.   Past Medical History:  Diagnosis Date   Actinic keratosis    Atrial fibrillation (HCC)    Basal cell carcinoma    Right arm, chest, left neck   BCC (basal cell carcinoma of skin) 02/12/2024   left forearm, excision 03/31/24   BCC (basal cell carcinoma of skin) 02/12/2024    superficial at right upper back, imiquimod    BCC (basal cell carcinoma of skin) 02/12/2024   superficial at left mid abdomen, imiquimod    BCC (basal cell carcinoma) 05/20/2024   superficial at central upper back, txted with Imiquimod , clear 09/16/24   Chronic systolic dysfunction of left ventricle    Diabetes mellitus    A1c of 8% is an improvement from the previous level of 11%   Hyperlipidemia    Hypertension    LVH (left ventricular hypertrophy)    Echo 08/3012-mild LVH   Melanoma in situ (HCC) 12/15/2023   Right cheek. Mohs 01/05/24   Nonischemic cardiomyopathy (HCC)    39% - echo on 07/20/2011 -- EF of 45-50% and grade  2 diastolic dysfunction.   Obesity    Obstructive sleep apnea    noncompliant with CPAP   Peripheral vascular disease    Squamous cell carcinoma in situ (SCCIS) 02/12/2024   left lateral neck, 5FU/calcipotriene   Current Outpatient Medications  Medication Sig Dispense Refill   APPLE CIDER VINEGAR PO Take 450-900 mg by mouth See admin instructions. Take 2 tablets (900 mg) by mouth in the morning & 1 tablet (450 mg) by mouth in the evening.     cetirizine (ZYRTEC) 10 MG tablet Take 10 mg by mouth daily.      FARXIGA 10 MG TABS tablet Take 10 mg by mouth daily.   1   furosemide  (LASIX ) 20 MG  tablet Take 1 tablet (20 mg total) by mouth daily. TAKE AN ADDITIONAL 20 MG (1 TABLET) IN THE PM IF NEEDED 90 tablet 3   gabapentin (NEURONTIN) 300 MG capsule Take 300 mg by mouth daily. Can take up  to 3 tablets a day     glipiZIDE (GLUCOTROL) 5 MG tablet Take 5 mg by mouth 2 (two) times daily before a meal.      metFORMIN (GLUCOPHAGE-XR) 500 MG 24 hr tablet Take 1,000 mg by mouth 2 (two) times a day.      metoprolol  succinate (TOPROL -XL) 25 MG 24 hr tablet TAKE 1 TABLET (25 MG TOTAL) BY MOUTH DAILY. 90 tablet 3   MOUNJARO 15 MG/0.5ML Pen Inject 15 mg into the skin once a week. Takes on Fridays     Potassium Chloride  ER 20 MEQ TBCR TAKE 1 TABLET BY MOUTH EVERY DAY 90 tablet 3    sacubitril-valsartan (ENTRESTO ) 24-26 MG TAKE 1 TABLET BY MOUTH TWICE A DAY 60 tablet 11   spironolactone  (ALDACTONE ) 25 MG tablet TAKE 1 TABLET (25 MG TOTAL) BY MOUTH DAILY. 90 tablet 3   XARELTO  20 MG TABS tablet Take 20 mg by mouth daily.   6   rosuvastatin  (CRESTOR ) 20 MG tablet TAKE 1 TABLET BY MOUTH EVERY DAY (Patient not taking: Reported on 11/29/2024) 15 tablet 0   No current facility-administered medications for this encounter.   Allergies  Allergen Reactions   Procaine Hcl Nausea And Vomiting    Per Pharmacy 09/09/2019, Okay for Lidocaine  and Bupivicaine.   Social History   Socioeconomic History   Marital status: Divorced    Spouse name: Not on file   Number of children: Not on file   Years of education: Not on file   Highest education level: Not on file  Occupational History   Not on file  Tobacco Use   Smoking status: Never   Smokeless tobacco: Never  Vaping Use   Vaping status: Never Used  Substance and Sexual Activity   Alcohol use: Yes    Alcohol/week: 1.0 standard drink of alcohol    Types: 1 Standard drinks or equivalent per week    Comment: seldom   Drug use: No   Sexual activity: Not on file  Other Topics Concern   Not on file  Social History Narrative   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,  Scientist, product/process development         Social Drivers of Health   Tobacco Use: Low Risk (11/29/2024)   Patient History    Smoking Tobacco Use: Never    Smokeless Tobacco Use: Never    Passive Exposure: Not on file  Financial Resource Strain: Not on file  Food Insecurity: Not on file  Transportation Needs: Not on file  Physical Activity: Not on file  Stress: Not on file  Social Connections: Not on file  Intimate Partner Violence: Not on file  Depression (EYV7-0): Not on file  Alcohol Screen: Not on file  Housing: Not on file  Utilities: Not on file  Health Literacy: Not on file   Family History  Problem Relation Age of Onset   Hypertension Mother    Diabetes  Mother    Alcohol abuse Father    CAD Father    Coronary artery disease Sister        with multiple stents   Coronary artery disease Other        in multiple family members   Hypertension Brother    Diabetes Sister  Diabetes Brother    BP 120/80   Pulse 83   Wt 104.9 kg (231 lb 3.2 oz)   SpO2 98%   BMI 28.90 kg/m   Wt Readings from Last 3 Encounters:  11/29/24 104.9 kg (231 lb 3.2 oz)  04/15/24 103.6 kg (228 lb 6.4 oz)  12/23/23 107.9 kg (237 lb 12.8 oz)   PHYSICAL EXAM: General:   No resp difficulty Neck: no JVD.  Cor: Irregular rate & rhythm.  Lungs: clear Abdomen: soft, nontender, nondistended.  Extremities: no  edema Neuro: alert & oriented x3  EKG: SR with frequent PVCs PR 224 ms QT 358/445 ms   ASSESSMENT & PLAN: 1. Chronic systolic HF due to NICM  - Echo 2023 showed severe LV dysfunction with EF 20-25% with moderate RVE and moderate RV dysfunction, G3DD mild AR.   - R/L Cath  02/07/22 Minimal CAD LAD 20% RCA 20% with apparent akinesis of the anterior wall. EF 25-30%. Well compensated hemodynamics.  - cMRI 05/30/22: LVEF = 37%. No LGE. RV normal Mild AI. No evidence of infiltrative disease.  - Zio 5/23 -67 runs of NSVTl 141 runs of SVT,  Frequent PACs 6.1%, Frequent PVCs 9.8%. - Suspect PVC CM. Amio started 6/23 - Echo 10/23 EF improved 60-65% moderate LVH.  Set up for repeat echo  - NYHA I. -Volume status stable.   - Continue Entresto  24/26 mg bid - Continue Farxiga 10 mg daily - Continue spiro 25 mg daily.  - Continue Toprol  XL 25 mg daily. Considered increasing Toprol  but PR 224 ms.  - Check BMET   2. PAF - s/p previous ablation.  -Regular on exam- EKG, stable.  - On Xarelto . No bleeding.  3. PVCs - frequent PVCs noted on prior EKG 2/23 - Zio 5/23 9.8% PVCs Zio 06/2024- 18.8% PVCs burden 3 morphologies. I discussed results.  - suspect PVC associated CM. Repeat Echo next week. IF EF down again will add mexitil and refer to EP.   4. OSA, moderate  -  Moderate, with AHI 16.4/hour.  - Using CPAP 4 hours a night.   5. Obesity  - Body mass index is 28.9 kg/m. - Remain on Mounjaro   Follow up next week for an ECHO. IF EF down will need to start mexitil and refer to EP.   Follow up with Dr Cherrie in 2 months. I spent 30 minutes reviewing records, interviewing/examining patient, and managing orders.     Dannisha Eckmann NP-C   11/29/2024  "

## 2024-11-29 ENCOUNTER — Encounter (HOSPITAL_COMMUNITY): Payer: Self-pay

## 2024-11-29 ENCOUNTER — Ambulatory Visit (HOSPITAL_COMMUNITY)
Admission: RE | Admit: 2024-11-29 | Discharge: 2024-11-29 | Disposition: A | Discharge status: Home / Self Care | Source: Ambulatory Visit | Attending: Cardiology | Admitting: Cardiology

## 2024-11-29 ENCOUNTER — Ambulatory Visit (HOSPITAL_COMMUNITY): Payer: Self-pay | Admitting: Adult Health

## 2024-11-29 VITALS — BP 120/80 | HR 83 | Wt 231.2 lb

## 2024-11-29 DIAGNOSIS — Z7985 Long-term (current) use of injectable non-insulin antidiabetic drugs: Secondary | ICD-10-CM | POA: Diagnosis not present

## 2024-11-29 DIAGNOSIS — Z7901 Long term (current) use of anticoagulants: Secondary | ICD-10-CM | POA: Diagnosis not present

## 2024-11-29 DIAGNOSIS — I5022 Chronic systolic (congestive) heart failure: Secondary | ICD-10-CM | POA: Diagnosis not present

## 2024-11-29 DIAGNOSIS — E1151 Type 2 diabetes mellitus with diabetic peripheral angiopathy without gangrene: Secondary | ICD-10-CM | POA: Insufficient documentation

## 2024-11-29 DIAGNOSIS — I472 Ventricular tachycardia, unspecified: Secondary | ICD-10-CM | POA: Diagnosis not present

## 2024-11-29 DIAGNOSIS — E669 Obesity, unspecified: Secondary | ICD-10-CM | POA: Insufficient documentation

## 2024-11-29 DIAGNOSIS — Z6828 Body mass index (BMI) 28.0-28.9, adult: Secondary | ICD-10-CM | POA: Insufficient documentation

## 2024-11-29 DIAGNOSIS — I110 Hypertensive heart disease with heart failure: Secondary | ICD-10-CM | POA: Insufficient documentation

## 2024-11-29 DIAGNOSIS — I2510 Atherosclerotic heart disease of native coronary artery without angina pectoris: Secondary | ICD-10-CM | POA: Diagnosis not present

## 2024-11-29 DIAGNOSIS — I493 Ventricular premature depolarization: Secondary | ICD-10-CM | POA: Insufficient documentation

## 2024-11-29 DIAGNOSIS — I519 Heart disease, unspecified: Secondary | ICD-10-CM | POA: Diagnosis not present

## 2024-11-29 DIAGNOSIS — Z79899 Other long term (current) drug therapy: Secondary | ICD-10-CM | POA: Diagnosis not present

## 2024-11-29 DIAGNOSIS — I480 Paroxysmal atrial fibrillation: Secondary | ICD-10-CM | POA: Diagnosis not present

## 2024-11-29 DIAGNOSIS — G4733 Obstructive sleep apnea (adult) (pediatric): Secondary | ICD-10-CM | POA: Insufficient documentation

## 2024-11-29 DIAGNOSIS — Z7984 Long term (current) use of oral hypoglycemic drugs: Secondary | ICD-10-CM | POA: Insufficient documentation

## 2024-11-29 DIAGNOSIS — Z86006 Personal history of melanoma in-situ: Secondary | ICD-10-CM | POA: Diagnosis not present

## 2024-11-29 DIAGNOSIS — I428 Other cardiomyopathies: Secondary | ICD-10-CM | POA: Insufficient documentation

## 2024-11-29 LAB — BASIC METABOLIC PANEL WITH GFR
Anion gap: 11 (ref 5–15)
BUN: 13 mg/dL (ref 6–20)
CO2: 29 mmol/L (ref 22–32)
Calcium: 9.8 mg/dL (ref 8.9–10.3)
Chloride: 100 mmol/L (ref 98–111)
Creatinine, Ser: 0.96 mg/dL (ref 0.61–1.24)
GFR, Estimated: >60 mL/min
Glucose, Bld: 196 mg/dL — ABNORMAL HIGH (ref 70–99)
Potassium: 4.3 mmol/L (ref 3.5–5.1)
Sodium: 140 mmol/L (ref 135–145)

## 2024-11-29 NOTE — Patient Instructions (Signed)
 Lab Work:  Labs done today, your results will be available in MyChart, we will contact you for abnormal readings. Testing/Procedures:  ECHOCARDIOGRAM AS SCHEDULED NEXT WEEK   Follow-Up in: 2 months with Dr.  Bensimhon as scheduled   At the [facility], you and your health needs are our priority. We have a designated team specialized in the treatment of Heart Failure. This Care Team includes your primary Heart Failure Specialized Cardiologist (physician), Advanced Practice Providers (APPs- Physician Assistants and Nurse Practitioners), and Pharmacist who all work together to provide you with the care you need, when you need it.   You may see any of the following providers on your designated Care Team at your next follow up:  Dr. Toribio Fuel Dr. Ezra Shuck Dr. Odis Brownie Greig Mosses, NP Caffie Shed, [place] [facility] [place], [place] Beckey Coe, NP Jordan Lee, NP Tinnie Redman, PharmD   Please be sure to bring in all your medications bottles to every appointment.   Need to Contact Us :  If you have any questions or concerns before your next appointment please send us  a message through [place] or call our office at [phone].    TO LEAVE A MESSAGE FOR THE NURSE SELECT OPTION 2, PLEASE LEAVE A MESSAGE INCLUDING: YOUR NAME DATE OF BIRTH CALL BACK NUMBER REASON FOR CALL**this is important as we prioritize the call backs  YOU WILL RECEIVE A CALL BACK THE SAME DAY AS LONG AS YOU CALL BEFORE 4:00 PM

## 2024-12-08 ENCOUNTER — Ambulatory Visit (HOSPITAL_COMMUNITY)
Admission: RE | Admit: 2024-12-08 | Discharge: 2024-12-08 | Disposition: A | Discharge status: Home / Self Care | Source: Ambulatory Visit | Attending: Adult Health | Admitting: Adult Health

## 2024-12-08 DIAGNOSIS — I519 Heart disease, unspecified: Secondary | ICD-10-CM

## 2024-12-08 DIAGNOSIS — I5022 Chronic systolic (congestive) heart failure: Secondary | ICD-10-CM

## 2024-12-08 DIAGNOSIS — G4730 Sleep apnea, unspecified: Secondary | ICD-10-CM | POA: Insufficient documentation

## 2024-12-08 DIAGNOSIS — I499 Cardiac arrhythmia, unspecified: Secondary | ICD-10-CM | POA: Diagnosis not present

## 2024-12-08 DIAGNOSIS — I509 Heart failure, unspecified: Secondary | ICD-10-CM | POA: Insufficient documentation

## 2024-12-08 DIAGNOSIS — I4891 Unspecified atrial fibrillation: Secondary | ICD-10-CM | POA: Diagnosis not present

## 2024-12-08 DIAGNOSIS — I083 Combined rheumatic disorders of mitral, aortic and tricuspid valves: Secondary | ICD-10-CM | POA: Diagnosis not present

## 2024-12-08 DIAGNOSIS — I110 Hypertensive heart disease with heart failure: Secondary | ICD-10-CM | POA: Diagnosis not present

## 2024-12-08 LAB — ECHOCARDIOGRAM COMPLETE
Area-P 1/2: 3.12 cm2
Calc EF: 50.1 %
S' Lateral: 3.1 cm
Single Plane A2C EF: 50.1 %
Single Plane A4C EF: 50.5 %

## 2024-12-17 ENCOUNTER — Other Ambulatory Visit: Payer: Self-pay | Admitting: Dermatology

## 2024-12-17 DIAGNOSIS — C439 Malignant melanoma of skin, unspecified: Secondary | ICD-10-CM

## 2024-12-23 ENCOUNTER — Ambulatory Visit: Admitting: Dermatology

## 2024-12-23 ENCOUNTER — Encounter: Payer: Self-pay | Admitting: Dermatology

## 2024-12-23 DIAGNOSIS — Z85828 Personal history of other malignant neoplasm of skin: Secondary | ICD-10-CM | POA: Diagnosis not present

## 2024-12-23 DIAGNOSIS — D485 Neoplasm of uncertain behavior of skin: Secondary | ICD-10-CM

## 2024-12-23 DIAGNOSIS — L439 Lichen planus, unspecified: Secondary | ICD-10-CM

## 2024-12-23 DIAGNOSIS — Z1283 Encounter for screening for malignant neoplasm of skin: Secondary | ICD-10-CM | POA: Diagnosis not present

## 2024-12-23 DIAGNOSIS — Z86007 Personal history of in-situ neoplasm of skin: Secondary | ICD-10-CM

## 2024-12-23 DIAGNOSIS — D1801 Hemangioma of skin and subcutaneous tissue: Secondary | ICD-10-CM | POA: Diagnosis not present

## 2024-12-23 DIAGNOSIS — D229 Melanocytic nevi, unspecified: Secondary | ICD-10-CM

## 2024-12-23 DIAGNOSIS — L814 Other melanin hyperpigmentation: Secondary | ICD-10-CM | POA: Diagnosis not present

## 2024-12-23 DIAGNOSIS — W908XXA Exposure to other nonionizing radiation, initial encounter: Secondary | ICD-10-CM

## 2024-12-23 DIAGNOSIS — Z86006 Personal history of melanoma in-situ: Secondary | ICD-10-CM

## 2024-12-23 DIAGNOSIS — L578 Other skin changes due to chronic exposure to nonionizing radiation: Secondary | ICD-10-CM | POA: Diagnosis not present

## 2024-12-23 DIAGNOSIS — L821 Other seborrheic keratosis: Secondary | ICD-10-CM

## 2024-12-23 DIAGNOSIS — C4491 Basal cell carcinoma of skin, unspecified: Secondary | ICD-10-CM

## 2024-12-23 DIAGNOSIS — C44519 Basal cell carcinoma of skin of other part of trunk: Secondary | ICD-10-CM | POA: Diagnosis not present

## 2024-12-23 HISTORY — DX: Basal cell carcinoma of skin, unspecified: C44.91

## 2024-12-23 NOTE — Patient Instructions (Addendum)
 Biopsy Wound Care Instructions  Leave the original bandage on for 24 hours if possible.  If the bandage becomes soaked or soiled before that time, it is OK to remove it and examine the wound.  A small amount of post-operative bleeding is normal.  If excessive bleeding occurs, remove the bandage, place gauze over the site and apply continuous pressure (no peeking) over the area for 30 minutes. If this does not work, please call our clinic as soon as possible or page your doctor if it is after hours.   Once a day, cleanse the wound with soap and water. It is fine to shower. After washing, apply petroleum jelly (Vaseline) or an antibiotic ointment if your doctor prescribed one for you, followed by a bandage.    For best healing, the wound should be covered with a layer of ointment at all times. If you are not able to keep the area covered with a bandage to hold the ointment in place, this may mean re-applying the ointment several times a day.  Continue this wound care until the wound has healed and is no longer open.   Itching and mild discomfort is normal during the healing process. However, if you develop pain or severe itching, please call our office.   If you have any discomfort, you can take Tylenol  (acetaminophen ) or ibuprofen as directed on the bottle. (Please do not take these if you have an allergy to them or cannot take them for another reason).  Some redness, tenderness and white or yellow material in the wound is normal healing.  If the area becomes very sore and red, or develops a thick yellow-green material (pus), it may be infected; please notify us .    If you have stitches, return to clinic as directed to have the stitches removed. You will continue wound care for 2-3 days after the stitches are removed.   Wound healing continues for up to one year following surgery. It is not unusual to experience pain in the scar from time to time during the interval.  If the pain becomes severe or the  scar thickens, you should notify the office.    A slight amount of redness in a scar is expected for the first six months.  After six months, the redness will fade and the scar will soften and fade.  The color difference becomes less noticeable with time.  If there are any problems, return for a post-op surgery check at your earliest convenience.  To improve the appearance of the scar, you can use silicone scar gel, cream, or sheets (such as Mederma or Serica) every night for up to one year. These are available over the counter (without a prescription).  Please call our office at [phone] for any questions or concerns.  Melanoma ABCDEs  Melanoma is the most dangerous type of skin cancer, and is the leading cause of death from skin disease.  You are more likely to develop melanoma if you: Have light-colored skin, light-colored eyes, or red or blond hair Spend a lot of time in the sun Tan regularly, either outdoors or in a tanning bed Have had blistering sunburns, especially during childhood Have a close family member who has had a melanoma Have atypical moles or large birthmarks  Early detection of melanoma is key since treatment is typically straightforward and cure rates are extremely high if we catch it early.   The first sign of melanoma is often a change in a mole or a new dark  spot.  The ABCDE system is a way of remembering the signs of melanoma.  A for asymmetry:  The two halves do not match. B for border:  The edges of the growth are irregular. C for color:  A mixture of colors are present instead of an even brown color. D for diameter:  Melanomas are usually (but not always) greater than 6mm - the size of a pencil eraser. E for evolution:  The spot keeps changing in size, shape, and color.  Please check your skin once per month between visits. You can use a small mirror in front and a large mirror behind you to keep an eye on the back side or your body.   If you see any new  or changing lesions before your next follow-up, please call to schedule a visit.  Please continue daily skin protection including broad spectrum sunscreen SPF 30+ to sun-exposed areas, reapplying every 2 hours as needed when you're outdoors.    Due to recent changes in healthcare laws, you may see results of your pathology and/or laboratory studies on MyChart before the doctors have had a chance to review them. We understand that in some cases there may be results that are confusing or concerning to you. Please understand that not all results are received at the same time and often the doctors may need to interpret multiple results in order to provide you with the best plan of care or course of treatment. Therefore, we ask that you please give us  2 business days to thoroughly review all your results before contacting the office for clarification. Should we see a critical lab result, you will be contacted sooner.   If You Need Anything After Your Visit  If you have any questions or concerns for your doctor, please call our main line at [phone] and press option 4 to reach your doctor's medical assistant. If no one answers, please leave a voicemail as directed and we will return your call as soon as possible. Messages left after 4 pm will be answered the following business day.   You may also send us  a message via MyChart. We typically respond to MyChart messages within 1-2 business days.  For prescription refills, please ask your pharmacy to contact our office. Our fax number is [phone].  If you have an urgent issue when the clinic is closed that cannot wait until the next business day, you can page your doctor at the number below.    Please note that while we do our best to be available for urgent issues outside of office hours, we are not available 24/7.   If you have an urgent issue and are unable to reach us , you may choose to seek medical care at your doctor's office, retail clinic,  urgent care center, or emergency room.  If you have a medical emergency, please immediately call 911 or go to the emergency department.  Pager Numbers  - Dr. Hester: [phone]  - Dr. Jackquline: [phone]  - Dr. Claudene: [phone]   - Dr. Raymund: [phone]  In the event of inclement weather, please call our main line at [phone] for an update on the status of any delays or closures.  Dermatology Medication Tips: Please keep the boxes that topical medications come in in order to help keep track of the instructions about where and how to use these. Pharmacies typically print the medication instructions only on the boxes and not directly on the medication tubes.   If your medication is  too expensive, please contact our office at [phone] option 4 or send us  a message through MyChart.   We are unable to tell what your co-pay for medications will be in advance as this is different depending on your insurance coverage. However, we may be able to find a substitute medication at lower cost or fill out paperwork to get insurance to cover a needed medication.   If a prior authorization is required to get your medication covered by your insurance company, please allow us  1-2 business days to complete this process.  Drug prices often vary depending on where the prescription is filled and some pharmacies may offer cheaper prices.  The website www.goodrx.com contains coupons for medications through different pharmacies. The prices here do not account for what the cost may be with help from insurance (it may be cheaper with your insurance), but the website can give you the price if you did not use any insurance.  - You can print the associated coupon and take it with your prescription to the pharmacy.  - You may also stop by our office during regular business hours and pick up a GoodRx coupon card.  - If you need your prescription sent electronically to a different pharmacy, notify our  office through [facility] or by phone at [phone] option 4.     Si Usted Necesita Algo Despus de Su Visita  Tambin puede enviarnos un mensaje a travs de Clinical Cytogeneticist. Por lo general respondemos a los mensajes de MyChart en el transcurso de 1 a 2 das hbiles.  Para renovar recetas, por favor pida a su farmacia que se ponga en contacto con nuestra oficina. Randi lakes de fax es [place] [phone].  Si tiene un asunto urgente cuando la clnica est cerrada y que no puede esperar hasta el siguiente da hbil, puede llamar/localizar a su doctor(a) al nmero que aparece a continuacin.   Por favor, tenga en cuenta que aunque hacemos todo lo posible para estar disponibles para asuntos urgentes fuera del horario de [place], no estamos disponibles las 24 horas del da, los 7 [address]  [phone] de la [place].   Si tiene un problema urgente y no puede comunicarse con nosotros, puede optar por buscar atencin mdica  en el consultorio de su doctor(a), en una clnica privada, en un centro de atencin urgente o en una sala de emergencias.  Si tiene engineer, drilling, por favor llame inmediatamente al 911 o vaya a la sala de emergencias.  Nmeros de bper  - Dr. Hester: [phone]  - Dra. Jackquline: [phone]  - Dr. Claudene: [phone]  - Dra. Kitts: [phone]  En caso de inclemencias del [place], por favor llame a nuestra lnea principal al [phone] para una actualizacin sobre el estado de cualquier retraso o cierre.  Consejos para la medicacin en dermatologa: Por favor, guarde las cajas en las que vienen los medicamentos de uso tpico para ayudarle a seguir las instrucciones sobre dnde y cmo usarlos. Las farmacias generalmente imprimen las instrucciones del medicamento slo en las cajas y no directamente en los tubos del [place].   Si su medicamento es muy caro, por favor, pngase en contacto con landry rieger llamando al [phone] y presione la opcin 4 o envenos un  mensaje a travs de Clinical Cytogeneticist.   No podemos decirle cul ser su copago por los medicamentos por adelantado ya que esto es diferente dependiendo de la cobertura de su seguro. Sin embargo, es posible que podamos encontrar un medicamento sustituto a audiological scientist  un formulario para que el seguro cubra el medicamento que se considera necesario.   Si se requiere una autorizacin previa para que su compaa de seguros cubra su medicamento, por favor permtanos de 1 a 2 das hbiles para completar este proceso.  Los precios de los medicamentos varan con frecuencia dependiendo del environmental consultant de dnde se surte la receta y alguna farmacias pueden ofrecer precios ms baratos.  El sitio web www.goodrx.com tiene cupones para medicamentos de health and safety inspector. Los precios aqu no tienen en cuenta lo que podra costar con la ayuda del seguro (puede ser ms barato con su seguro), pero el sitio web puede darle el precio si no utiliz tourist information centre manager.  - Puede imprimir el cupn correspondiente y llevarlo con su receta a la farmacia.  - Tambin puede pasar por nuestra oficina durante el horario de atencin regular y education officer, museum una tarjeta de cupones de GoodRx.  - Si necesita que su receta se enve electrnicamente a una farmacia diferente, informe a nuestra oficina a travs de MyChart de [place] o por telfono llamando al [phone] y presione la opcin 4.

## 2024-12-23 NOTE — Progress Notes (Signed)
 "  Follow-Up Visit   Subjective  Charles [name] is a 59 y.o. male who presents for the following: Skin Cancer Screening and Full Body Skin Exam  The patient presents for Total-Body Skin Exam (TBSE) for skin cancer screening and mole check. The patient has spots, moles and lesions to be evaluated, some may be new or changing and the patient may have concern these could be cancer.  Hx SCC, BCC, Mmis. Patient uses topical 5FU/calcipotriene to rough areas at face until red and sore. Most recently has been using above left brow.  The following portions of the chart were reviewed this encounter and updated as appropriate: medications, allergies, medical history  Review of Systems:  No other skin or systemic complaints except as noted in HPI or Assessment and Plan.  Objective  Well appearing patient in no apparent distress; mood and affect are within normal limits.  A full examination was performed including scalp, head, eyes, ears, nose, lips, neck, chest, axillae, abdomen, back, buttocks, bilateral upper extremities, bilateral lower extremities, hands, feet, fingers, toes, fingernails, and toenails. All findings within normal limits unless otherwise noted below.   Relevant physical exam findings are noted in the Assessment and Plan.  right upper chest 1.5 cm pink plaque with telangiectasias  Left Flank 5 mm pink papule with irregular pigment  Assessment & Plan   SKIN CANCER SCREENING PERFORMED TODAY.  ACTINIC DAMAGE - Chronic condition, secondary to cumulative UV/sun exposure - diffuse scaly erythematous macules with underlying dyspigmentation - Recommend daily broad spectrum sunscreen SPF 30+ to sun-exposed areas, reapply every 2 hours as needed.  - Staying in the shade or wearing long sleeves, sun glasses (UVA+UVB protection) and wide brim hats (4-inch brim around the entire circumference of the hat) are also recommended for sun protection.  - Call for new or changing  lesions.  LENTIGINES, SEBORRHEIC KERATOSES, HEMANGIOMAS - Benign normal skin lesions - Benign-appearing - Call for any changes  MELANOCYTIC NEVI - Tan-brown and/or pink-flesh-colored symmetric macules and papules - Benign appearing on exam today - Observation - Call clinic for new or changing moles - Recommend daily use of broad spectrum spf 30+ sunscreen to sun-exposed areas.   HISTORY OF MELANOMA IN SITU - No evidence of recurrence today - Recommend regular full body skin exams - Recommend daily broad spectrum sunscreen SPF 30+ to sun-exposed areas, reapply every 2 hours as needed.  - Call if any new or changing lesions are noted between office visits  - R cheek mohs 01/05/2024   HISTORY OF BASAL CELL CARCINOMA OF THE SKIN - No evidence of recurrence today - Recommend regular full body skin exams - Recommend daily broad spectrum sunscreen SPF 30+ to sun-exposed areas, reapply every 2 hours as needed.  - Call if any new or changing lesions are noted between office visits  -R arm, chest, L neck, L forearm, R upper back, L mid abdomen, central upper back clear today   HISTORY OF SQUAMOUS CELL CARCINOMA IN SITU OF THE SKIN - No evidence of recurrence today - Recommend regular full body skin exams - Recommend daily broad spectrum sunscreen SPF 30+ to sun-exposed areas, reapply every 2 hours as needed.  - Call if any new or changing lesions are noted between office visits  -L lat neck    NEOPLASM OF UNCERTAIN BEHAVIOR OF SKIN (3) Right Upper Arm right upper chest - Skin / nail biopsy Type of biopsy: tangential   Informed consent: discussed and consent obtained   Timeout: patient  name, date of birth, surgical site, and procedure verified   Procedure prep:  Patient was prepped and draped in usual sterile fashion Prep type:  Isopropyl alcohol Anesthesia: the lesion was anesthetized in a standard fashion   Anesthetic:  1% lidocaine  w/ epinephrine 1-100,000 buffered w/ 8.4%  NaHCO3 Instrument used: DermaBlade   Hemostasis achieved with: pressure and aluminum chloride   Outcome: patient tolerated procedure well   Post-procedure details: sterile dressing applied and wound care instructions given   Dressing type: bandage and petrolatum    Specimen 2 - Surgical pathology Differential Diagnosis: ISK vs BCC  Check Margins: No 2 pieces Left Flank - Epidermal / dermal shaving  Lesion diameter (cm):  0.5 Informed consent: discussed and consent obtained   Timeout: patient name, date of birth, surgical site, and procedure verified   Procedure prep:  Patient was prepped and draped in usual sterile fashion Prep type:  Isopropyl alcohol Anesthesia: the lesion was anesthetized in a standard fashion   Anesthetic:  1% lidocaine  w/ epinephrine 1-100,000 buffered w/ 8.4% NaHCO3 Instrument used: DermaBlade   Hemostasis achieved with: pressure and aluminum chloride   Outcome: patient tolerated procedure well   Post-procedure details: wound care instructions given    Specimen 1 - Surgical pathology Differential Diagnosis: dysplastic nevus vs melanoma  Check Margins: No Patient has been treating spot at right upper arm with 5FU/calcipotriene. Recheck on follow up and if not healing, plan biopsy to r/o BCC. MULTIPLE BENIGN NEVI   LENTIGINES   ACTINIC ELASTOSIS   SEBORRHEIC KERATOSES   CHERRY ANGIOMA   Return for 3 - 4 months recheck spot at R upper arm, with Dr. Claudene, TBSE, HxBCC, HxMMis, HxSCC.  LILLETTE Lonell Drones, RMA, am acting as scribe for Boneta Claudene, MD .   Documentation: I have reviewed the above documentation for accuracy and completeness, and I agree with the above.  Boneta Claudene, MD   "

## 2024-12-28 LAB — SURGICAL PATHOLOGY

## 2024-12-29 ENCOUNTER — Ambulatory Visit: Payer: Self-pay | Admitting: Dermatology

## 2024-12-29 ENCOUNTER — Encounter: Payer: Self-pay | Admitting: Dermatology

## 2024-12-29 NOTE — Telephone Encounter (Signed)
-----   Message from Boneta Sharps, MD sent at 12/29/2024  9:40 AM EST ----- Diagnosis: 1. Skin, left flank :       LICHEN PLANUS-LIKE KERATOSIS, PIGMENTED        2. Skin, right upper chest :       SUPERFICIAL AND NODULAR BASAL CELL CARCINOMA    Please call   L flank, benign inflamed keratosis, no treatment R chest Explanation: your biopsy shows basal cell skin cancer in the first and second layers of skin. This is the most common kind of skin cancer and is caused by damage from sun exposure. Basal cell skin  cancers almost never spread beyond the skin, so they are not dangerous to your overall health. However, they will continue to grow, can bleed, cause nonhealing wounds, and disrupt nearby structures  unless fully treated.   Treatment: Excision - you return for an hour long appointment in our clinic where we perform a skin surgery. We numb the site of the skin cancer and a safety margin of normal skin around it. We  remove the full thickness of skin and close the wound with two layers of stitches. The sample is sent to the lab to check that the skin cancer was fully removed. Return one week later to have wound  checked and surface stitches removed. Surgical wound leaves a line scar. Approximately 95% cure rate. Risk of recurrence, bleeding, infection, pain, injury to nearby structures, hypertrophic scar.

## 2024-12-29 NOTE — Telephone Encounter (Signed)
 Left voicemail for patient to return call regarding biopsy results.

## 2025-01-05 NOTE — Telephone Encounter (Signed)
 Patient advised of biopsy results and scheduled for surgery March 11th. aw

## 2025-01-28 ENCOUNTER — Ambulatory Visit (HOSPITAL_COMMUNITY): Admitting: Internal Medicine

## 2025-02-16 ENCOUNTER — Encounter: Admitting: Dermatology

## 2025-03-24 ENCOUNTER — Ambulatory Visit: Admitting: Dermatology
